# Patient Record
Sex: Female | Born: 1945 | Race: Black or African American | Hispanic: No | Marital: Single | Smoking: Current every day smoker
Health system: Southern US, Community
[De-identification: ages and names within clinical notes are randomized; demographics above are authoritative.]

## PROBLEM LIST (undated history)

## (undated) DIAGNOSIS — K903 Pancreatic steatorrhea: Secondary | ICD-10-CM

## (undated) DIAGNOSIS — D126 Benign neoplasm of colon, unspecified: Secondary | ICD-10-CM

## (undated) DIAGNOSIS — J441 Chronic obstructive pulmonary disease with (acute) exacerbation: Secondary | ICD-10-CM

## (undated) DIAGNOSIS — K648 Other hemorrhoids: Secondary | ICD-10-CM

## (undated) DIAGNOSIS — B3781 Candidal esophagitis: Secondary | ICD-10-CM

## (undated) DIAGNOSIS — I509 Heart failure, unspecified: Secondary | ICD-10-CM

## (undated) DIAGNOSIS — K922 Gastrointestinal hemorrhage, unspecified: Secondary | ICD-10-CM

## (undated) DIAGNOSIS — I480 Paroxysmal atrial fibrillation: Secondary | ICD-10-CM

## (undated) DIAGNOSIS — E119 Type 2 diabetes mellitus without complications: Secondary | ICD-10-CM

## (undated) DIAGNOSIS — I998 Other disorder of circulatory system: Secondary | ICD-10-CM

## (undated) DIAGNOSIS — D509 Iron deficiency anemia, unspecified: Secondary | ICD-10-CM

## (undated) DIAGNOSIS — M1990 Unspecified osteoarthritis, unspecified site: Secondary | ICD-10-CM

## (undated) DIAGNOSIS — I219 Acute myocardial infarction, unspecified: Secondary | ICD-10-CM

## (undated) DIAGNOSIS — Z5189 Encounter for other specified aftercare: Secondary | ICD-10-CM

## (undated) DIAGNOSIS — I2510 Atherosclerotic heart disease of native coronary artery without angina pectoris: Secondary | ICD-10-CM

## (undated) DIAGNOSIS — I10 Essential (primary) hypertension: Secondary | ICD-10-CM

## (undated) DIAGNOSIS — K5790 Diverticulosis of intestine, part unspecified, without perforation or abscess without bleeding: Secondary | ICD-10-CM

## (undated) HISTORY — DX: Type 2 diabetes mellitus without complications: E11.9

## (undated) HISTORY — DX: Other disorder of circulatory system: I99.8

## (undated) HISTORY — PX: KNEE ARTHROPLASTY: SHX992

## (undated) HISTORY — DX: Acute myocardial infarction, unspecified: I21.9

## (undated) HISTORY — DX: Diverticulosis of intestine, part unspecified, without perforation or abscess without bleeding: K57.90

## (undated) HISTORY — DX: Paroxysmal atrial fibrillation: I48.0

## (undated) HISTORY — DX: Pancreatic steatorrhea: K90.3

## (undated) HISTORY — DX: Essential (primary) hypertension: I10

## (undated) HISTORY — DX: Candidal esophagitis: B37.81

## (undated) HISTORY — PX: TUBAL LIGATION: SHX77

## (undated) HISTORY — DX: Benign neoplasm of colon, unspecified: D12.6

## (undated) HISTORY — PX: CATARACT EXTRACTION: SUR2

## (undated) HISTORY — DX: Chronic obstructive pulmonary disease with (acute) exacerbation: J44.1

## (undated) HISTORY — DX: Encounter for other specified aftercare: Z51.89

## (undated) HISTORY — DX: Unspecified osteoarthritis, unspecified site: M19.90

## (undated) HISTORY — PX: CARPAL TUNNEL RELEASE: SHX101

## (undated) HISTORY — DX: Atherosclerotic heart disease of native coronary artery without angina pectoris: I25.10

## (undated) HISTORY — DX: Other hemorrhoids: K64.8

## (undated) HISTORY — DX: Gastrointestinal hemorrhage, unspecified: K92.2

## (undated) HISTORY — DX: Heart failure, unspecified: I50.9

## (undated) HISTORY — DX: Iron deficiency anemia, unspecified: D50.9

---

## 1998-12-13 ENCOUNTER — Emergency Department (HOSPITAL_COMMUNITY): Admission: EM | Admit: 1998-12-13 | Discharge: 1998-12-13 | Payer: Self-pay | Admitting: Emergency Medicine

## 1999-01-01 ENCOUNTER — Emergency Department (HOSPITAL_COMMUNITY): Admission: EM | Admit: 1999-01-01 | Discharge: 1999-01-01 | Payer: Self-pay | Admitting: Emergency Medicine

## 1999-02-05 ENCOUNTER — Ambulatory Visit (HOSPITAL_COMMUNITY): Admission: RE | Admit: 1999-02-05 | Discharge: 1999-02-05 | Payer: Self-pay | Admitting: Internal Medicine

## 1999-02-05 ENCOUNTER — Encounter: Payer: Self-pay | Admitting: Internal Medicine

## 1999-03-21 ENCOUNTER — Encounter: Payer: Self-pay | Admitting: Internal Medicine

## 1999-03-21 ENCOUNTER — Inpatient Hospital Stay (HOSPITAL_COMMUNITY): Admission: AD | Admit: 1999-03-21 | Discharge: 1999-03-28 | Payer: Self-pay | Admitting: Cardiology

## 1999-08-02 ENCOUNTER — Inpatient Hospital Stay (HOSPITAL_COMMUNITY): Admission: AD | Admit: 1999-08-02 | Discharge: 1999-08-08 | Payer: Self-pay | Admitting: Cardiology

## 1999-09-17 ENCOUNTER — Encounter: Payer: Self-pay | Admitting: Cardiology

## 1999-09-17 ENCOUNTER — Inpatient Hospital Stay (HOSPITAL_COMMUNITY): Admission: EM | Admit: 1999-09-17 | Discharge: 1999-09-20 | Payer: Self-pay | Admitting: Emergency Medicine

## 1999-09-20 ENCOUNTER — Encounter: Payer: Self-pay | Admitting: Emergency Medicine

## 2000-02-25 ENCOUNTER — Emergency Department (HOSPITAL_COMMUNITY): Admission: EM | Admit: 2000-02-25 | Discharge: 2000-02-25 | Payer: Self-pay | Admitting: Emergency Medicine

## 2000-03-02 ENCOUNTER — Emergency Department (HOSPITAL_COMMUNITY): Admission: EM | Admit: 2000-03-02 | Discharge: 2000-03-02 | Payer: Self-pay | Admitting: Emergency Medicine

## 2000-03-06 ENCOUNTER — Emergency Department (HOSPITAL_COMMUNITY): Admission: EM | Admit: 2000-03-06 | Discharge: 2000-03-06 | Payer: Self-pay | Admitting: Emergency Medicine

## 2000-04-06 ENCOUNTER — Ambulatory Visit (HOSPITAL_COMMUNITY): Admission: RE | Admit: 2000-04-06 | Discharge: 2000-04-06 | Payer: Self-pay | Admitting: Internal Medicine

## 2000-04-06 ENCOUNTER — Encounter: Payer: Self-pay | Admitting: Internal Medicine

## 2000-08-21 ENCOUNTER — Encounter: Payer: Self-pay | Admitting: Family Medicine

## 2000-08-21 ENCOUNTER — Ambulatory Visit (HOSPITAL_COMMUNITY): Admission: RE | Admit: 2000-08-21 | Discharge: 2000-08-21 | Payer: Self-pay | Admitting: Family Medicine

## 2000-09-23 ENCOUNTER — Ambulatory Visit (HOSPITAL_COMMUNITY): Admission: RE | Admit: 2000-09-23 | Discharge: 2000-09-23 | Payer: Self-pay | Admitting: Family Medicine

## 2000-10-22 ENCOUNTER — Encounter: Admission: RE | Admit: 2000-10-22 | Discharge: 2000-11-30 | Payer: Self-pay | Admitting: Family Medicine

## 2000-11-05 ENCOUNTER — Ambulatory Visit (HOSPITAL_COMMUNITY): Admission: RE | Admit: 2000-11-05 | Discharge: 2000-11-06 | Payer: Self-pay | Admitting: Cardiology

## 2000-12-17 ENCOUNTER — Encounter: Admission: RE | Admit: 2000-12-17 | Discharge: 2001-01-04 | Payer: Self-pay | Admitting: Family Medicine

## 2003-12-30 HISTORY — PX: CORONARY ARTERY BYPASS GRAFT: SHX141

## 2009-12-29 HISTORY — PX: TOTAL SHOULDER ARTHROPLASTY: SHX126

## 2011-06-11 DIAGNOSIS — G4733 Obstructive sleep apnea (adult) (pediatric): Secondary | ICD-10-CM | POA: Insufficient documentation

## 2011-06-11 DIAGNOSIS — M159 Polyosteoarthritis, unspecified: Secondary | ICD-10-CM | POA: Insufficient documentation

## 2011-07-30 DIAGNOSIS — K5730 Diverticulosis of large intestine without perforation or abscess without bleeding: Secondary | ICD-10-CM | POA: Insufficient documentation

## 2011-07-30 DIAGNOSIS — K648 Other hemorrhoids: Secondary | ICD-10-CM | POA: Insufficient documentation

## 2014-08-22 DIAGNOSIS — Z96659 Presence of unspecified artificial knee joint: Secondary | ICD-10-CM | POA: Insufficient documentation

## 2014-12-27 DIAGNOSIS — M76899 Other specified enthesopathies of unspecified lower limb, excluding foot: Secondary | ICD-10-CM | POA: Insufficient documentation

## 2015-06-15 DIAGNOSIS — M4316 Spondylolisthesis, lumbar region: Secondary | ICD-10-CM | POA: Insufficient documentation

## 2017-05-19 DIAGNOSIS — N3941 Urge incontinence: Secondary | ICD-10-CM | POA: Insufficient documentation

## 2017-05-19 DIAGNOSIS — N3281 Overactive bladder: Secondary | ICD-10-CM | POA: Insufficient documentation

## 2017-08-18 DIAGNOSIS — I4892 Unspecified atrial flutter: Secondary | ICD-10-CM | POA: Insufficient documentation

## 2017-10-29 DIAGNOSIS — A048 Other specified bacterial intestinal infections: Secondary | ICD-10-CM

## 2017-10-29 HISTORY — DX: Other specified bacterial intestinal infections: A04.8

## 2017-12-17 DIAGNOSIS — K903 Pancreatic steatorrhea: Secondary | ICD-10-CM | POA: Insufficient documentation

## 2018-12-29 DIAGNOSIS — G459 Transient cerebral ischemic attack, unspecified: Secondary | ICD-10-CM

## 2018-12-29 HISTORY — DX: Transient cerebral ischemic attack, unspecified: G45.9

## 2018-12-31 DIAGNOSIS — R9439 Abnormal result of other cardiovascular function study: Secondary | ICD-10-CM | POA: Insufficient documentation

## 2019-11-13 DIAGNOSIS — H8111 Benign paroxysmal vertigo, right ear: Secondary | ICD-10-CM | POA: Insufficient documentation

## 2019-11-13 DIAGNOSIS — G459 Transient cerebral ischemic attack, unspecified: Secondary | ICD-10-CM | POA: Insufficient documentation

## 2020-09-06 DIAGNOSIS — R55 Syncope and collapse: Secondary | ICD-10-CM | POA: Insufficient documentation

## 2020-12-24 DIAGNOSIS — M25572 Pain in left ankle and joints of left foot: Secondary | ICD-10-CM | POA: Insufficient documentation

## 2021-04-08 ENCOUNTER — Encounter (HOSPITAL_COMMUNITY): Payer: Self-pay | Admitting: Internal Medicine

## 2021-04-08 ENCOUNTER — Inpatient Hospital Stay (HOSPITAL_COMMUNITY)
Admission: EM | Admit: 2021-04-08 | Discharge: 2021-04-11 | DRG: 190 | Disposition: A | Discharge status: Home / Self Care | Payer: 59 | Attending: Internal Medicine | Admitting: Internal Medicine

## 2021-04-08 ENCOUNTER — Other Ambulatory Visit: Payer: Self-pay

## 2021-04-08 ENCOUNTER — Emergency Department (HOSPITAL_COMMUNITY): Payer: 59

## 2021-04-08 DIAGNOSIS — E119 Type 2 diabetes mellitus without complications: Secondary | ICD-10-CM

## 2021-04-08 DIAGNOSIS — R195 Other fecal abnormalities: Secondary | ICD-10-CM | POA: Diagnosis not present

## 2021-04-08 DIAGNOSIS — K5520 Angiodysplasia of colon without hemorrhage: Secondary | ICD-10-CM

## 2021-04-08 DIAGNOSIS — D122 Benign neoplasm of ascending colon: Secondary | ICD-10-CM

## 2021-04-08 DIAGNOSIS — Z951 Presence of aortocoronary bypass graft: Secondary | ICD-10-CM | POA: Diagnosis not present

## 2021-04-08 DIAGNOSIS — I4510 Unspecified right bundle-branch block: Secondary | ICD-10-CM | POA: Diagnosis present

## 2021-04-08 DIAGNOSIS — I509 Heart failure, unspecified: Secondary | ICD-10-CM

## 2021-04-08 DIAGNOSIS — E785 Hyperlipidemia, unspecified: Secondary | ICD-10-CM | POA: Diagnosis present

## 2021-04-08 DIAGNOSIS — D62 Acute posthemorrhagic anemia: Secondary | ICD-10-CM | POA: Diagnosis present

## 2021-04-08 DIAGNOSIS — I10 Essential (primary) hypertension: Secondary | ICD-10-CM

## 2021-04-08 DIAGNOSIS — I482 Chronic atrial fibrillation, unspecified: Secondary | ICD-10-CM | POA: Diagnosis present

## 2021-04-08 DIAGNOSIS — B3781 Candidal esophagitis: Secondary | ICD-10-CM | POA: Diagnosis present

## 2021-04-08 DIAGNOSIS — J441 Chronic obstructive pulmonary disease with (acute) exacerbation: Secondary | ICD-10-CM | POA: Diagnosis present

## 2021-04-08 DIAGNOSIS — R600 Localized edema: Secondary | ICD-10-CM | POA: Diagnosis not present

## 2021-04-08 DIAGNOSIS — G8929 Other chronic pain: Secondary | ICD-10-CM | POA: Diagnosis present

## 2021-04-08 DIAGNOSIS — E1165 Type 2 diabetes mellitus with hyperglycemia: Secondary | ICD-10-CM | POA: Diagnosis present

## 2021-04-08 DIAGNOSIS — Z79899 Other long term (current) drug therapy: Secondary | ICD-10-CM

## 2021-04-08 DIAGNOSIS — Z794 Long term (current) use of insulin: Secondary | ICD-10-CM | POA: Diagnosis not present

## 2021-04-08 DIAGNOSIS — I110 Hypertensive heart disease with heart failure: Secondary | ICD-10-CM | POA: Diagnosis present

## 2021-04-08 DIAGNOSIS — Z7901 Long term (current) use of anticoagulants: Secondary | ICD-10-CM

## 2021-04-08 DIAGNOSIS — I2510 Atherosclerotic heart disease of native coronary artery without angina pectoris: Secondary | ICD-10-CM | POA: Diagnosis present

## 2021-04-08 DIAGNOSIS — K5521 Angiodysplasia of colon with hemorrhage: Secondary | ICD-10-CM | POA: Diagnosis present

## 2021-04-08 DIAGNOSIS — Z8249 Family history of ischemic heart disease and other diseases of the circulatory system: Secondary | ICD-10-CM

## 2021-04-08 DIAGNOSIS — D125 Benign neoplasm of sigmoid colon: Secondary | ICD-10-CM

## 2021-04-08 DIAGNOSIS — K5730 Diverticulosis of large intestine without perforation or abscess without bleeding: Secondary | ICD-10-CM | POA: Diagnosis present

## 2021-04-08 DIAGNOSIS — K921 Melena: Secondary | ICD-10-CM | POA: Diagnosis not present

## 2021-04-08 DIAGNOSIS — I480 Paroxysmal atrial fibrillation: Secondary | ICD-10-CM | POA: Diagnosis present

## 2021-04-08 DIAGNOSIS — F17210 Nicotine dependence, cigarettes, uncomplicated: Secondary | ICD-10-CM | POA: Diagnosis present

## 2021-04-08 DIAGNOSIS — K31811 Angiodysplasia of stomach and duodenum with bleeding: Secondary | ICD-10-CM | POA: Diagnosis present

## 2021-04-08 DIAGNOSIS — Z7951 Long term (current) use of inhaled steroids: Secondary | ICD-10-CM

## 2021-04-08 DIAGNOSIS — E669 Obesity, unspecified: Secondary | ICD-10-CM | POA: Diagnosis present

## 2021-04-08 DIAGNOSIS — D500 Iron deficiency anemia secondary to blood loss (chronic): Secondary | ICD-10-CM | POA: Diagnosis not present

## 2021-04-08 DIAGNOSIS — Z20822 Contact with and (suspected) exposure to covid-19: Secondary | ICD-10-CM | POA: Diagnosis present

## 2021-04-08 DIAGNOSIS — Z66 Do not resuscitate: Secondary | ICD-10-CM | POA: Diagnosis present

## 2021-04-08 DIAGNOSIS — I5089 Other heart failure: Secondary | ICD-10-CM | POA: Diagnosis not present

## 2021-04-08 DIAGNOSIS — Z880 Allergy status to penicillin: Secondary | ICD-10-CM

## 2021-04-08 DIAGNOSIS — M549 Dorsalgia, unspecified: Secondary | ICD-10-CM | POA: Diagnosis present

## 2021-04-08 DIAGNOSIS — D509 Iron deficiency anemia, unspecified: Secondary | ICD-10-CM

## 2021-04-08 DIAGNOSIS — Z6841 Body Mass Index (BMI) 40.0 and over, adult: Secondary | ICD-10-CM

## 2021-04-08 DIAGNOSIS — D649 Anemia, unspecified: Secondary | ICD-10-CM

## 2021-04-08 DIAGNOSIS — Z882 Allergy status to sulfonamides status: Secondary | ICD-10-CM

## 2021-04-08 DIAGNOSIS — Z8719 Personal history of other diseases of the digestive system: Secondary | ICD-10-CM

## 2021-04-08 DIAGNOSIS — D123 Benign neoplasm of transverse colon: Secondary | ICD-10-CM

## 2021-04-08 LAB — RESP PANEL BY RT-PCR (FLU A&B, COVID) ARPGX2
Influenza A by PCR: NEGATIVE
Influenza B by PCR: NEGATIVE
SARS Coronavirus 2 by RT PCR: NEGATIVE

## 2021-04-08 LAB — COMPREHENSIVE METABOLIC PANEL
ALT: 18 U/L (ref 0–44)
AST: 16 U/L (ref 15–41)
Albumin: 3.5 g/dL (ref 3.5–5.0)
Alkaline Phosphatase: 69 U/L (ref 38–126)
Anion gap: 8 (ref 5–15)
BUN: <5 mg/dL — ABNORMAL LOW (ref 8–23)
CO2: 28 mmol/L (ref 22–32)
Calcium: 8.7 mg/dL — ABNORMAL LOW (ref 8.9–10.3)
Chloride: 99 mmol/L (ref 98–111)
Creatinine, Ser: 0.57 mg/dL (ref 0.44–1.00)
GFR, Estimated: >60 mL/min (ref >60)
Glucose, Bld: 263 mg/dL — ABNORMAL HIGH (ref 70–99)
Potassium: 3.9 mmol/L (ref 3.5–5.1)
Sodium: 135 mmol/L (ref 135–145)
Total Bilirubin: 0.5 mg/dL (ref 0.3–1.2)
Total Protein: 6.7 g/dL (ref 6.5–8.1)

## 2021-04-08 LAB — CBC WITH DIFFERENTIAL/PLATELET
Abs Immature Granulocytes: 0.04 10*3/uL (ref 0.00–0.07)
Basophils Absolute: 0.1 10*3/uL (ref 0.0–0.1)
Basophils Relative: 1 %
Eosinophils Absolute: 0.2 10*3/uL (ref 0.0–0.5)
Eosinophils Relative: 3 %
HCT: 25.4 % — ABNORMAL LOW (ref 36.0–46.0)
Hemoglobin: 7.8 g/dL — ABNORMAL LOW (ref 12.0–15.0)
Immature Granulocytes: 1 %
Lymphocytes Relative: 11 %
Lymphs Abs: 0.7 10*3/uL (ref 0.7–4.0)
MCH: 25.7 pg — ABNORMAL LOW (ref 26.0–34.0)
MCHC: 30.7 g/dL (ref 30.0–36.0)
MCV: 83.6 fL (ref 80.0–100.0)
Monocytes Absolute: 0.6 10*3/uL (ref 0.1–1.0)
Monocytes Relative: 9 %
Neutro Abs: 5.3 10*3/uL (ref 1.7–7.7)
Neutrophils Relative %: 75 %
Platelets: 346 10*3/uL (ref 150–400)
RBC: 3.04 MIL/uL — ABNORMAL LOW (ref 3.87–5.11)
RDW: 15.4 % (ref 11.5–15.5)
WBC: 7 10*3/uL (ref 4.0–10.5)
nRBC: 0.4 % — ABNORMAL HIGH (ref 0.0–0.2)

## 2021-04-08 LAB — POC OCCULT BLOOD, ED: Fecal Occult Bld: POSITIVE — AB

## 2021-04-08 LAB — IRON AND TIBC
Iron: 15 ug/dL — ABNORMAL LOW (ref 28–170)
Saturation Ratios: 3 % — ABNORMAL LOW (ref 10.4–31.8)
TIBC: 528 ug/dL — ABNORMAL HIGH (ref 250–450)
UIBC: 513 ug/dL

## 2021-04-08 LAB — URINALYSIS, ROUTINE W REFLEX MICROSCOPIC
Bacteria, UA: NONE SEEN
Bilirubin Urine: NEGATIVE
Glucose, UA: NEGATIVE mg/dL
Ketones, ur: NEGATIVE mg/dL
Leukocytes,Ua: NEGATIVE
Nitrite: NEGATIVE
Protein, ur: NEGATIVE mg/dL
Specific Gravity, Urine: 1.006 (ref 1.005–1.030)
pH: 7 (ref 5.0–8.0)

## 2021-04-08 LAB — HEMOGLOBIN AND HEMATOCRIT, BLOOD
HCT: 24.5 % — ABNORMAL LOW (ref 36.0–46.0)
HCT: 26.2 % — ABNORMAL LOW (ref 36.0–46.0)
Hemoglobin: 7.6 g/dL — ABNORMAL LOW (ref 12.0–15.0)
Hemoglobin: 8.3 g/dL — ABNORMAL LOW (ref 12.0–15.0)

## 2021-04-08 LAB — ABO/RH: ABO/RH(D): AB POS

## 2021-04-08 LAB — PROTIME-INR
INR: 2.6 — ABNORMAL HIGH (ref 0.8–1.2)
Prothrombin Time: 26.8 seconds — ABNORMAL HIGH (ref 11.4–15.2)

## 2021-04-08 LAB — HEMOGLOBIN A1C
Hgb A1c MFr Bld: 6.3 % — ABNORMAL HIGH (ref 4.8–5.6)
Mean Plasma Glucose: 134.11 mg/dL

## 2021-04-08 LAB — CBG MONITORING, ED: Glucose-Capillary: 273 mg/dL — ABNORMAL HIGH (ref 70–99)

## 2021-04-08 LAB — TROPONIN I (HIGH SENSITIVITY)
Troponin I (High Sensitivity): 10 ng/L (ref <18)
Troponin I (High Sensitivity): 9 ng/L (ref <18)

## 2021-04-08 LAB — BRAIN NATRIURETIC PEPTIDE: B Natriuretic Peptide: 108 pg/mL — ABNORMAL HIGH (ref 0.0–100.0)

## 2021-04-08 LAB — PREPARE RBC (CROSSMATCH)

## 2021-04-08 LAB — GLUCOSE, CAPILLARY: Glucose-Capillary: 460 mg/dL — ABNORMAL HIGH (ref 70–99)

## 2021-04-08 LAB — HIV ANTIBODY (ROUTINE TESTING W REFLEX): HIV Screen 4th Generation wRfx: NONREACTIVE

## 2021-04-08 LAB — FERRITIN: Ferritin: 6 ng/mL — ABNORMAL LOW (ref 11–307)

## 2021-04-08 MED ORDER — METOPROLOL SUCCINATE ER 100 MG PO TB24
100.0000 mg | ORAL_TABLET | Freq: Every day | ORAL | Status: DC
  Administered 2021-04-08 – 2021-04-11 (×4): 100 mg via ORAL
  Filled 2021-04-08 (×4): qty 1

## 2021-04-08 MED ORDER — ALBUTEROL SULFATE (2.5 MG/3ML) 0.083% IN NEBU: 2.5000 mg | INHALATION_SOLUTION | RESPIRATORY_TRACT | Status: DC | PRN

## 2021-04-08 MED ORDER — INSULIN GLARGINE 100 UNIT/ML ~~LOC~~ SOLN
42.0000 [IU] | Freq: Every day | SUBCUTANEOUS | Status: DC
  Administered 2021-04-08: 42 [IU] via SUBCUTANEOUS
  Filled 2021-04-08 (×3): qty 0.42

## 2021-04-08 MED ORDER — INSULIN ASPART 100 UNIT/ML ~~LOC~~ SOLN
0.0000 [IU] | Freq: Every day | SUBCUTANEOUS | Status: DC
  Administered 2021-04-08: 5 [IU] via SUBCUTANEOUS

## 2021-04-08 MED ORDER — INSULIN DEGLUDEC 100 UNIT/ML ~~LOC~~ SOPN: 42.0000 [IU] | PEN_INJECTOR | Freq: Every day | SUBCUTANEOUS | Status: DC

## 2021-04-08 MED ORDER — AZITHROMYCIN 500 MG PO TABS
500.0000 mg | ORAL_TABLET | Freq: Every day | ORAL | Status: DC
  Administered 2021-04-09 – 2021-04-11 (×3): 500 mg via ORAL
  Filled 2021-04-08 (×4): qty 1

## 2021-04-08 MED ORDER — METHYLPREDNISOLONE SODIUM SUCC 125 MG IJ SOLR
125.0000 mg | Freq: Once | INTRAMUSCULAR | Status: AC
  Administered 2021-04-08: 125 mg via INTRAVENOUS
  Filled 2021-04-08: qty 2

## 2021-04-08 MED ORDER — PREDNISONE 20 MG PO TABS
40.0000 mg | ORAL_TABLET | Freq: Every day | ORAL | Status: DC
  Administered 2021-04-09: 40 mg via ORAL
  Filled 2021-04-08: qty 2

## 2021-04-08 MED ORDER — AEROCHAMBER PLUS FLO-VU MEDIUM MISC
1.0000 | Freq: Once | Status: DC
  Filled 2021-04-08: qty 1

## 2021-04-08 MED ORDER — GUAIFENESIN-DM 100-10 MG/5ML PO SYRP
10.0000 mL | ORAL_SOLUTION | ORAL | Status: DC | PRN
  Administered 2021-04-08 – 2021-04-09 (×4): 10 mL via ORAL
  Filled 2021-04-08 (×4): qty 10

## 2021-04-08 MED ORDER — INSULIN ASPART 100 UNIT/ML ~~LOC~~ SOLN
0.0000 [IU] | Freq: Three times a day (TID) | SUBCUTANEOUS | Status: DC
  Administered 2021-04-08: 20 [IU] via SUBCUTANEOUS
  Administered 2021-04-09: 15 [IU] via SUBCUTANEOUS
  Administered 2021-04-09: 7 [IU] via SUBCUTANEOUS
  Administered 2021-04-09 – 2021-04-11 (×2): 4 [IU] via SUBCUTANEOUS

## 2021-04-08 MED ORDER — FLUTICASONE FUROATE-VILANTEROL 100-25 MCG/INH IN AEPB
1.0000 | INHALATION_SPRAY | Freq: Every day | RESPIRATORY_TRACT | Status: DC
  Administered 2021-04-09 – 2021-04-11 (×3): 1 via RESPIRATORY_TRACT
  Filled 2021-04-08: qty 28

## 2021-04-08 MED ORDER — GABAPENTIN 100 MG PO CAPS
200.0000 mg | ORAL_CAPSULE | Freq: Three times a day (TID) | ORAL | Status: DC
  Administered 2021-04-08 – 2021-04-11 (×8): 200 mg via ORAL
  Filled 2021-04-08 (×8): qty 2

## 2021-04-08 MED ORDER — PANTOPRAZOLE SODIUM 40 MG IV SOLR
40.0000 mg | Freq: Two times a day (BID) | INTRAVENOUS | Status: DC
  Administered 2021-04-08 – 2021-04-09 (×4): 40 mg via INTRAVENOUS
  Filled 2021-04-08 (×4): qty 40

## 2021-04-08 MED ORDER — IPRATROPIUM-ALBUTEROL 0.5-2.5 (3) MG/3ML IN SOLN
3.0000 mL | Freq: Four times a day (QID) | RESPIRATORY_TRACT | Status: DC
  Administered 2021-04-08 (×2): 3 mL via RESPIRATORY_TRACT
  Filled 2021-04-08 (×2): qty 3

## 2021-04-08 MED ORDER — PRAVASTATIN SODIUM 10 MG PO TABS
20.0000 mg | ORAL_TABLET | Freq: Every day | ORAL | Status: DC
  Administered 2021-04-08 – 2021-04-10 (×3): 20 mg via ORAL
  Filled 2021-04-08 (×3): qty 2

## 2021-04-08 MED ORDER — SODIUM CHLORIDE 0.9% IV SOLUTION: Freq: Once | INTRAVENOUS | Status: AC

## 2021-04-08 MED ORDER — HYDROCODONE-ACETAMINOPHEN 10-325 MG PO TABS
1.0000 | ORAL_TABLET | Freq: Two times a day (BID) | ORAL | Status: DC | PRN
  Administered 2021-04-08 – 2021-04-10 (×3): 1 via ORAL
  Filled 2021-04-08 (×4): qty 1

## 2021-04-08 MED ORDER — ALBUTEROL SULFATE HFA 108 (90 BASE) MCG/ACT IN AERS
4.0000 | INHALATION_SPRAY | Freq: Once | RESPIRATORY_TRACT | Status: AC
  Administered 2021-04-08: 4 via RESPIRATORY_TRACT
  Filled 2021-04-08: qty 6.7

## 2021-04-08 MED ORDER — AZITHROMYCIN 500 MG IV SOLR
500.0000 mg | INTRAVENOUS | Status: AC
  Administered 2021-04-08: 500 mg via INTRAVENOUS
  Filled 2021-04-08: qty 500

## 2021-04-08 MED ORDER — IPRATROPIUM-ALBUTEROL 0.5-2.5 (3) MG/3ML IN SOLN
3.0000 mL | Freq: Three times a day (TID) | RESPIRATORY_TRACT | Status: DC
  Administered 2021-04-09 – 2021-04-10 (×4): 3 mL via RESPIRATORY_TRACT
  Filled 2021-04-08 (×4): qty 3

## 2021-04-08 MED ORDER — ACETAMINOPHEN 325 MG PO TABS
650.0000 mg | ORAL_TABLET | Freq: Four times a day (QID) | ORAL | Status: DC | PRN
  Administered 2021-04-08 – 2021-04-11 (×4): 650 mg via ORAL
  Filled 2021-04-08 (×4): qty 2

## 2021-04-08 NOTE — H&P (View-Only) (Signed)
Consultation  Referring Provider:  Fam practice service Primary Care Physician:  Pcp, No Primary Gastroenterologist:  none  Reason for Consultation:  Anemia, hx of melena  HPI: Caitlyn Lawrence is a 75 y.o. female, admitted through the emergency room this morning after presenting with complaints of shortness of breath over the past couple of weeks.  She says the shortness of breath has gotten to the point that she could not walk to the bathroom.  Denies any chest pain.  On further questioning she also related that she had been having tarry black appearing stools.  She apparently is on iron chronically. She has history of atrial fibrillation for which she is on Eliquis, insulin-dependent diabetes mellitus, COPD, chronic back pain, history of coronary artery disease status post CABG and history of hypertension.  Was documented heme positive in the ER. Admitting labs hemoglobin 7.8/hematocrit 25.4, MCV of 83, platelets 316 BUN less than 5/creatinine 0.57 LFTs within normal limits Pro time 26.8/INR 2.6, troponins negative COVID-19 negative Chest x-ray today no acute abnormality  Patient says that she stays on iron supplement chronically so her stools are always dark but over the past several days had noticed more "tarry black" stools.  She denies any abdominal pain or discomfort, no diarrhea, no nausea or vomiting no heartburn or indigestion. Patient relates that she is just in the process of moving to [place] from [facility] and has had GI evaluation there for recurrent GI bleeding.  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  She says the last procedures were about 4 years ago and these were all done by Dr. Chauncey Reading gastroenterology. She has been on Xarelto for several years, she has not been on any aspirin over the past couple of years due to the  recurrent bleeding, uses Tylenol as needed.     Prior to Admission medications   Medication Sig Start Date End Date Taking? Authorizing Provider  albuterol (ACCUNEB) 0.63 MG/3ML nebulizer solution Take 1 ampule by nebulization every 6 (six) hours as needed for wheezing or shortness of breath.   Yes [provider]  amLODipine (NORVASC) 2.5 MG tablet Take 2.5 mg by mouth daily.   Yes [provider]  ferrous sulfate 325 (65 FE) MG tablet Take 325 mg by mouth daily with breakfast.   Yes [provider]  fluticasone furoate-vilanterol (BREO ELLIPTA) 100-25 MCG/INH AEPB Inhale 1 puff into the lungs daily.   Yes [provider]  gabapentin (NEURONTIN) 100 MG capsule Take 200 mg by mouth 3 (three) times daily.   Yes [provider]  HYDROcodone-acetaminophen (NORCO) 10-325 MG tablet Take 1 tablet by mouth 2 (two) times daily as needed (pain).   Yes [provider]  hydrOXYzine (VISTARIL) 25 MG capsule Take 25 mg by mouth as needed for itching.   Yes [provider]  insulin degludec (TRESIBA FLEXTOUCH) 100 UNIT/ML FlexTouch Pen Inject 54 Units into the skin at bedtime.   Yes [provider]  insulin lispro (HUMALOG) 100 UNIT/ML injection Inject 15 Units into the skin 3 (three) times daily with meals.   Yes [provider]  Ipratropium-Albuterol (COMBIVENT RESPIMAT) 20-100 MCG/ACT AERS respimat Inhale 1 puff into the lungs 4 (four) times daily.   Yes [provider]  ipratropium-albuterol (DUONEB) 0.5-2.5 (3) MG/3ML SOLN Take 3 mLs by nebulization 4 (four) times daily.   Yes [provider]  isosorbide mononitrate (IMDUR) 60 MG 24 hr tablet Take 60 mg by mouth every morning.   Yes [provider]  losartan (COZAAR) 100 MG tablet Take 100 mg by mouth daily.   Yes [provider]  lovastatin (MEVACOR) 20 MG tablet Take 20 mg by mouth every evening.   Yes [provider]  meclizine  (ANTIVERT) 12.5 MG tablet Take 12.5 mg by mouth as needed for dizziness.   Yes [provider]  metoprolol succinate (TOPROL-XL) 100 MG 24 hr tablet Take 100 mg by mouth daily. Take with or immediately following a meal.   Yes [provider]  nitroGLYCERIN (NITROSTAT) 0.4 MG SL tablet Place 0.4 mg under the tongue every 5 (five) minutes as needed for chest pain.   Yes [provider]  ondansetron (ZOFRAN) 4 MG tablet Take 4 mg by mouth as needed for nausea or vomiting.   Yes [provider]  polyethylene glycol (MIRALAX / GLYCOLAX) 17 g packet Take 17 g by mouth as needed for mild constipation.   Yes [provider]  rivaroxaban (XARELTO) 20 MG TABS tablet Take 20 mg by mouth every morning.   Yes [provider]  Vitamin D, Ergocalciferol, (DRISDOL) 1.25 MG (50000 UNIT) CAPS capsule Take 50,000 Units by mouth every 7 (seven) days. Mondays   Yes [provider]    Current Facility-Administered Medications  Medication Dose Route Frequency Provider Last Rate Last Admin  . 0.9 %  sodium chloride infusion (Manually program via Guardrails IV Fluids)   Intravenous Once [place], Carley D, DO      . AeroChamber Plus Flo-Vu Medium MISC 1 each  1 each Other Once Aberman, Caroline C, PA-C      . albuterol (PROVENTIL) (2.5 MG/3ML) 0.083% nebulizer solution 2.5 mg  2.5 mg Nebulization Q2H PRN Bloomfield, Carley D, DO      . azithromycin (ZITHROMAX) 500 mg in sodium chloride 0.9 % 250 mL IVPB  500 mg Intravenous Q24H Bloomfield, Carley D, DO       Followed by  . [START ON 04/09/2021] azithromycin (ZITHROMAX) tablet 500 mg  500 mg Oral Daily Bloomfield, Carley D, DO      . fluticasone furoate-vilanterol (BREO ELLIPTA) 100-25 MCG/INH 1 puff  1 puff Inhalation Daily Bloomfield, Carley D, DO      . gabapentin (NEURONTIN) capsule 200 mg  200 mg Oral TID Bloomfield, Carley D, DO      . HYDROcodone-acetaminophen (NORCO) 10-325 MG per tablet 1 tablet  1  tablet Oral BID PRN [place]field, Carley D, DO      . insulin aspart (novoLOG) injection 0-20 Units  0-20 Units Subcutaneous TID WC Bloomfield, Carley D, DO      . insulin aspart (novoLOG) injection 0-5 Units  0-5 Units Subcutaneous QHS Bloomfield, Carley D, DO      . insulin glargine (LANTUS) injection 42 Units  42 Units Subcutaneous QHS Pierce, Dwayne A, RPH      . ipratropium-albuterol (DUONEB) 0.5-2.5 (3) MG/3ML nebulizer solution 3 mL  3 mL Nebulization Q6H Bloomfield, Carley D, DO      . metoprolol succinate (TOPROL-XL) 24 hr tablet 100 mg  100 mg Oral Daily Bloomfield, Carley D, DO      . pantoprazole (PROTONIX) injection 40 mg  40 mg Intravenous Q12H Bloomfield, Carley D, DO      . pravastatin (PRAVACHOL) tablet 20 mg  20 mg Oral q1800 Bloomfield, Carley D, DO      . [START ON  04/09/2021] predniSONE (DELTASONE) tablet 40 mg  40 mg Oral Q breakfast Bloomfield, Carley D, DO        Allergies as of 04/08/2021 - Review Complete 04/08/2021  Allergen Reaction Noted  . Penicillins Hives and Itching 04/08/2021  . Sulfa antibiotics Other (See Comments) 04/08/2021    No family history on file.  Social History   Socioeconomic History  . Marital status: Single    Spouse name: Not on file  . Number of children: Not on file  . Years of education: Not on file  . Highest education level: Not on file  Occupational History  . Not on file  Tobacco Use  . Smoking status: Not on file  . Smokeless tobacco: Not on file  Substance and Sexual Activity  . Alcohol use: Not on file  . Drug use: Not on file  . Sexual activity: Not on file  Other Topics Concern  . Not on file  Social History Narrative  . Not on file   Social Determinants of Health   Financial Resource Strain: Not on file  Food Insecurity: Not on file  Transportation Needs: Not on file  Physical Activity: Not on file  Stress: Not on file  Social Connections: Not on file  Intimate Partner Violence: Not on file    Review of  Systems: Pertinent positive and negative review of systems were noted in the above HPI section.  All other review of systems was otherwise negative.  Physical Exam: Vital signs in last 24 hours: Temp:  [98.1 F (36.7 C)-98.9 F (37.2 C)] 98.4 F (36.9 C) (04/11 1407) Pulse Rate:  [86-107] 98 (04/11 1407) Resp:  [14-20] 18 (04/11 1407) BP: (122-155)/(56-87) 139/68 (04/11 1407) SpO2:  [93 %-100 %] 93 % (04/11 1407) Weight:  [106.1 kg] 106.1 kg (04/11 0934) Last BM Date: 04/08/21 General:   Alert,  Well-developed, well-nourished, elderly African-American female pleasant and cooperative in NAD mildly short of breath with talking Head:  Normocephalic and atraumatic. Eyes:  Sclera clear, no icterus.   Conjunctiva pink. Ears:  Normal auditory acuity. Nose:  No deformity, discharge,  or lesions. Mouth:  No deformity or lesions.   Neck:  Supple; no masses or thyromegaly. Lungs:  Clear throughout to auscultation.   Few expiratory wheezes . Heart:  Regular rate and rhythm; no murmurs, clicks, rubs,  or gallops. Abdomen:  Soft, obese,,nontender, BS active,nonpalp mass or hsm.   Rectal documented heme positive in the ER Msk:  Symmetrical without gross deformities. . Pulses:  Normal pulses noted. Extremities:  Without clubbing or edema. Neurologic:  Alert and  oriented x4;  grossly normal neurologically. Skin:  Intact without significant lesions or rashes.. Psych:  Alert and cooperative. Normal mood and affect.  Intake/Output from previous day: No intake/output data recorded. Intake/Output this shift: No intake/output data recorded.  Lab Results: Recent Labs    04/08/21 0925  WBC 7.0  HGB 7.8*  HCT 25.4*  PLT 346   BMET Recent Labs    04/08/21 0925  NA 135  K 3.9  CL 99  CO2 28  GLUCOSE 263*  BUN <5*  CREATININE 0.57  CALCIUM 8.7*   LFT Recent Labs    04/08/21 0925  PROT 6.7  ALBUMIN 3.5  AST 16  ALT 18  ALKPHOS 69  BILITOT 0.5   PT/INR Recent Labs     04/08/21 0925  LABPROT 26.8*  INR 2.6*   Hepatitis Panel No results for input(s): HEPBSAG, HCVAB, HEPAIGM, HEPBIGM in the last 72  hours.    IMPRESSION:  #63 75 year old African-American female admitted with progressive shortness of breath over the past 2 weeks and found to have significant anemia with hemoglobin of 7.8, normocytic and heme positive on exam. Patient relates prior history of intermittent GI bleeding and has had prior work-ups in [place] where she has just moved from. 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  #2 history of colon polyps 3.  Atrial fibrillation on chronic Xarelto 4.  Insulin-dependent diabetes mellitus 5.  COPD 6.  Coronary artery disease status post CABG 7.  Hypertension 8.  Chronic back pain 9.  Obesity  Plan Heart healthy carb modified diet, will change to clear liquids today prior to procedures Hold Xarelto Daily INR Serial hemoglobins with transfuse for hemoglobin 7.5 or less, would favor transfusing today as she is significantly symptomatic Twice daily PPI IV as doing Will request her records from her gastroenterologist in [place] We will plan for colonoscopy and EGD on Wednesday or Thursday pending normalization of INR. Plan was discussed with the patient, and she is in agreement.  Thank you will follow with you     Kirsty Monjaraz PA-C 04/08/2021, 2:14 PM

## 2021-04-08 NOTE — Consult Note (Signed)
Consultation  Referring Provider:  Fam practice service Primary Care Physician:  Pcp, No Primary Gastroenterologist:  none  Reason for Consultation:  Anemia, hx of melena  HPI: Caitlyn Lawrence is a 75 y.o. female, admitted through the emergency room this morning after presenting with complaints of shortness of breath over the past couple of weeks.  She says the shortness of breath has gotten to the point that she could not walk to the bathroom.  Denies any chest pain.  On further questioning she also related that she had been having tarry black appearing stools.  She apparently is on iron chronically. She has history of atrial fibrillation for which she is on Eliquis, insulin-dependent diabetes mellitus, COPD, chronic back pain, history of coronary artery disease status post CABG and history of hypertension.  Was documented heme positive in the ER. Admitting labs hemoglobin 7.8/hematocrit 25.4, MCV of 83, platelets 316 BUN less than 5/creatinine 0.57 LFTs within normal limits Pro time 26.8/INR 2.6, troponins negative COVID-19 negative Chest x-ray today no acute abnormality  Patient says that she stays on iron supplement chronically so her stools are always dark but over the past several days had noticed more "tarry black" stools.  She denies any abdominal pain or discomfort, no diarrhea, no nausea or vomiting no heartburn or indigestion. Patient relates that she is just in the process of moving to [place] from [facility] and has had GI evaluation there for recurrent GI bleeding.  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  She says the last procedures were about 4 years ago and these were all done by Dr. Chauncey Reading gastroenterology. She has been on Xarelto for several years, she has not been on any aspirin over the past couple of years due to the  recurrent bleeding, uses Tylenol as needed.     Prior to Admission medications   Medication Sig Start Date End Date Taking? Authorizing Provider  albuterol (ACCUNEB) 0.63 MG/3ML nebulizer solution Take 1 ampule by nebulization every 6 (six) hours as needed for wheezing or shortness of breath.   Yes [provider]  amLODipine (NORVASC) 2.5 MG tablet Take 2.5 mg by mouth daily.   Yes [provider]  ferrous sulfate 325 (65 FE) MG tablet Take 325 mg by mouth daily with breakfast.   Yes [provider]  fluticasone furoate-vilanterol (BREO ELLIPTA) 100-25 MCG/INH AEPB Inhale 1 puff into the lungs daily.   Yes [provider]  gabapentin (NEURONTIN) 100 MG capsule Take 200 mg by mouth 3 (three) times daily.   Yes [provider]  HYDROcodone-acetaminophen (NORCO) 10-325 MG tablet Take 1 tablet by mouth 2 (two) times daily as needed (pain).   Yes [provider]  hydrOXYzine (VISTARIL) 25 MG capsule Take 25 mg by mouth as needed for itching.   Yes [provider]  insulin degludec (TRESIBA FLEXTOUCH) 100 UNIT/ML FlexTouch Pen Inject 54 Units into the skin at bedtime.   Yes [provider]  insulin lispro (HUMALOG) 100 UNIT/ML injection Inject 15 Units into the skin 3 (three) times daily with meals.   Yes [provider]  Ipratropium-Albuterol (COMBIVENT RESPIMAT) 20-100 MCG/ACT AERS respimat Inhale 1 puff into the lungs 4 (four) times daily.   Yes [provider]  ipratropium-albuterol (DUONEB) 0.5-2.5 (3) MG/3ML SOLN Take 3 mLs by nebulization 4 (four) times daily.   Yes [provider]  isosorbide mononitrate (IMDUR) 60 MG 24 hr tablet Take 60 mg by mouth every morning.   Yes [provider]  losartan (COZAAR) 100 MG tablet Take 100 mg by mouth daily.   Yes [provider]  lovastatin (MEVACOR) 20 MG tablet Take 20 mg by mouth every evening.   Yes [provider]  meclizine  (ANTIVERT) 12.5 MG tablet Take 12.5 mg by mouth as needed for dizziness.   Yes [provider]  metoprolol succinate (TOPROL-XL) 100 MG 24 hr tablet Take 100 mg by mouth daily. Take with or immediately following a meal.   Yes [provider]  nitroGLYCERIN (NITROSTAT) 0.4 MG SL tablet Place 0.4 mg under the tongue every 5 (five) minutes as needed for chest pain.   Yes [provider]  ondansetron (ZOFRAN) 4 MG tablet Take 4 mg by mouth as needed for nausea or vomiting.   Yes [provider]  polyethylene glycol (MIRALAX / GLYCOLAX) 17 g packet Take 17 g by mouth as needed for mild constipation.   Yes [provider]  rivaroxaban (XARELTO) 20 MG TABS tablet Take 20 mg by mouth every morning.   Yes [provider]  Vitamin D, Ergocalciferol, (DRISDOL) 1.25 MG (50000 UNIT) CAPS capsule Take 50,000 Units by mouth every 7 (seven) days. Mondays   Yes [provider]    Current Facility-Administered Medications  Medication Dose Route Frequency Provider Last Rate Last Admin  . 0.9 %  sodium chloride infusion (Manually program via Guardrails IV Fluids)   Intravenous Once [place], Carley D, DO      . AeroChamber Plus Flo-Vu Medium MISC 1 each  1 each Other Once Aberman, Caroline C, PA-C      . albuterol (PROVENTIL) (2.5 MG/3ML) 0.083% nebulizer solution 2.5 mg  2.5 mg Nebulization Q2H PRN Bloomfield, Carley D, DO      . azithromycin (ZITHROMAX) 500 mg in sodium chloride 0.9 % 250 mL IVPB  500 mg Intravenous Q24H Bloomfield, Carley D, DO       Followed by  . [START ON 04/09/2021] azithromycin (ZITHROMAX) tablet 500 mg  500 mg Oral Daily Bloomfield, Carley D, DO      . fluticasone furoate-vilanterol (BREO ELLIPTA) 100-25 MCG/INH 1 puff  1 puff Inhalation Daily Bloomfield, Carley D, DO      . gabapentin (NEURONTIN) capsule 200 mg  200 mg Oral TID Bloomfield, Carley D, DO      . HYDROcodone-acetaminophen (NORCO) 10-325 MG per tablet 1 tablet  1  tablet Oral BID PRN Bloomfield, Carley D, DO      . insulin aspart (novoLOG) injection 0-20 Units  0-20 Units Subcutaneous TID WC Bloomfield, Carley D, DO      . insulin aspart (novoLOG) injection 0-5 Units  0-5 Units Subcutaneous QHS Bloomfield, Carley D, DO      . insulin glargine (LANTUS) injection 42 Units  42 Units Subcutaneous QHS Pierce, Dwayne A, RPH      . ipratropium-albuterol (DUONEB) 0.5-2.5 (3) MG/3ML nebulizer solution 3 mL  3 mL Nebulization Q6H Bloomfield, Carley D, DO      . metoprolol succinate (TOPROL-XL) 24 hr tablet 100 mg  100 mg Oral Daily Bloomfield, Carley D, DO      . pantoprazole (PROTONIX) injection 40 mg  40 mg Intravenous Q12H Bloomfield, Carley D, DO      . pravastatin (PRAVACHOL) tablet 20 mg  20 mg Oral q1800 Bloomfield, Carley D, DO      . [START ON  04/09/2021] predniSONE (DELTASONE) tablet 40 mg  40 mg Oral Q breakfast Bloomfield, Carley D, DO        Allergies as of 04/08/2021 - Review Complete 04/08/2021  Allergen Reaction Noted  . Penicillins Hives and Itching 04/08/2021  . Sulfa antibiotics Other (See Comments) 04/08/2021    No family history on file.  Social History   Socioeconomic History  . Marital status: Single    Spouse name: Not on file  . Number of children: Not on file  . Years of education: Not on file  . Highest education level: Not on file  Occupational History  . Not on file  Tobacco Use  . Smoking status: Not on file  . Smokeless tobacco: Not on file  Substance and Sexual Activity  . Alcohol use: Not on file  . Drug use: Not on file  . Sexual activity: Not on file  Other Topics Concern  . Not on file  Social History Narrative  . Not on file   Social Determinants of Health   Financial Resource Strain: Not on file  Food Insecurity: Not on file  Transportation Needs: Not on file  Physical Activity: Not on file  Stress: Not on file  Social Connections: Not on file  Intimate Partner Violence: Not on file    Review of  Systems: Pertinent positive and negative review of systems were noted in the above HPI section.  All other review of systems was otherwise negative.  Physical Exam: Vital signs in last 24 hours: Temp:  [98.1 F (36.7 C)-98.9 F (37.2 C)] 98.4 F (36.9 C) (04/11 1407) Pulse Rate:  [86-107] 98 (04/11 1407) Resp:  [14-20] 18 (04/11 1407) BP: (122-155)/(56-87) 139/68 (04/11 1407) SpO2:  [93 %-100 %] 93 % (04/11 1407) Weight:  [106.1 kg] 106.1 kg (04/11 0934) Last BM Date: 04/08/21 General:   Alert,  Well-developed, well-nourished, elderly African-American female pleasant and cooperative in NAD mildly short of breath with talking Head:  Normocephalic and atraumatic. Eyes:  Sclera clear, no icterus.   Conjunctiva pink. Ears:  Normal auditory acuity. Nose:  No deformity, discharge,  or lesions. Mouth:  No deformity or lesions.   Neck:  Supple; no masses or thyromegaly. Lungs:  Clear throughout to auscultation.   Few expiratory wheezes . Heart:  Regular rate and rhythm; no murmurs, clicks, rubs,  or gallops. Abdomen:  Soft, obese,,nontender, BS active,nonpalp mass or hsm.   Rectal documented heme positive in the ER Msk:  Symmetrical without gross deformities. . Pulses:  Normal pulses noted. Extremities:  Without clubbing or edema. Neurologic:  Alert and  oriented x4;  grossly normal neurologically. Skin:  Intact without significant lesions or rashes.. Psych:  Alert and cooperative. Normal mood and affect.  Intake/Output from previous day: No intake/output data recorded. Intake/Output this shift: No intake/output data recorded.  Lab Results: Recent Labs    04/08/21 0925  WBC 7.0  HGB 7.8*  HCT 25.4*  PLT 346   BMET Recent Labs    04/08/21 0925  NA 135  K 3.9  CL 99  CO2 28  GLUCOSE 263*  BUN <5*  CREATININE 0.57  CALCIUM 8.7*   LFT Recent Labs    04/08/21 0925  PROT 6.7  ALBUMIN 3.5  AST 16  ALT 18  ALKPHOS 69  BILITOT 0.5   PT/INR Recent Labs     04/08/21 0925  LABPROT 26.8*  INR 2.6*   Hepatitis Panel No results for input(s): HEPBSAG, HCVAB, HEPAIGM, HEPBIGM in the last 72  hours.    IMPRESSION:  #4 75 year old African-American female admitted with progressive shortness of breath over the past 2 weeks and found to have significant anemia with hemoglobin of 7.8, normocytic and heme positive on exam. Patient relates prior history of intermittent GI bleeding and has had prior work-ups in [place] where she has just moved from. 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  #2 history of colon polyps 3.  Atrial fibrillation on chronic Xarelto 4.  Insulin-dependent diabetes mellitus 5.  COPD 6.  Coronary artery disease status post CABG 7.  Hypertension 8.  Chronic back pain 9.  Obesity  Plan Heart healthy carb modified diet, will change to clear liquids today prior to procedures Hold Xarelto Daily INR Serial hemoglobins with transfuse for hemoglobin 7.5 or less, would favor transfusing today as she is significantly symptomatic Twice daily PPI IV as doing Will request her records from her gastroenterologist in [place] We will plan for colonoscopy and EGD on Wednesday or Thursday pending normalization of INR. Plan was discussed with the patient, and she is in agreement.  Thank you will follow with you     Jullianna Gabor PA-C 04/08/2021, 2:14 PM

## 2021-04-08 NOTE — ED Triage Notes (Signed)
C/O shortness of breath; stated hx of CHF and heart surgery. States her ankles are more swolen than usual as well.

## 2021-04-08 NOTE — ED Notes (Signed)
[facility] RN aware that report has been called and pt can be moved to her inhouse room

## 2021-04-08 NOTE — ED Notes (Signed)
ED Provider at bedside, PA [facility]

## 2021-04-08 NOTE — ED Notes (Signed)
Message was sent to pharmacy to request aero chamber

## 2021-04-08 NOTE — ED Notes (Signed)
ED Provider at bedside. 

## 2021-04-08 NOTE — Progress Notes (Signed)
Subjective:   Yesterday afternoon, patient received 1 unit of packed red blood cells.  Overnight, no acute events.  This morning, Caitlyn Lawrence states she has sudden onset right sided posterior chest pain that went around to her right breast; pain was sharp and lasted just a few seconds. Otherwise, she states her breathing is improved today. She has seen a GI doctor before; reports she had blood vessels cauterized and that relieved her symptoms. Additionally, she had a few polyps removed.   Objective:  Vital signs in last 24 hours: Vitals:   04/08/21 2200 04/09/21 0618 04/09/21 0805 04/09/21 1010  BP: 135/83 (!) 143/70  111/62  Pulse: 94 86  70  Resp: 18 18 18 18   Temp: 98.3 F (36.8 C) 98.1 F (36.7 C)  97.9 F (36.6 C)  TempSrc: Oral Oral  Oral  SpO2: 97% 92%  94%  Weight:      Height:      On room air  Intake/Output Summary (Last 24 hours) at 04/09/2021 1055 Last data filed at 04/08/2021 2200 Gross per 24 hour  Intake 720 ml  Output --  Net 720 ml   Filed Weights   04/08/21 0934 04/08/21 1407  Weight: 106.1 kg 110 kg   Physical Exam Constitutional:      General: She is not in acute distress.    Appearance: She is obese.  Cardiovascular:     Rate and Rhythm: Normal rate and regular rhythm.  Pulmonary:     Breath sounds: Decreased breath sounds present. No wheezing or rales.  Musculoskeletal:     Right lower leg: Edema present.     Left lower leg: Edema present.     Comments: 1+ pitting edema of bilateral lower extremities  Neurological:     General: No focal deficit present.     Mental Status: She is alert and oriented to person, place, and time.  Psychiatric:        Mood and Affect: Mood normal.        Behavior: Behavior normal.   Labs in last 24 hours:  CBC Latest Ref Rng & Units 04/09/2021 04/08/2021 04/08/2021  WBC 4.0 - 10.5 K/uL 10.2 - -  Hemoglobin 12.0 - 15.0 g/dL 8.0(L) 8.3(L) 7.6(L)  Hematocrit 36.0 - 46.0 % 25.0(L) 26.2(L) 24.5(L)  Platelets 150 -  400 K/uL 295 - -   INR - 1.4 today from 2.6 Hemoglobin and hematocrit - 8.3 from 7.6 Glucose - 331 from 341, 460, 273 Hemoglobin A1c - 6.3 Iron and TIBC:  -Iron 15 -TIBC 528 -Saturation Ratios 3 -UIBC 513 Ferritin - 6  Imaging following admission: No further imaging  Assessment/Plan:  Principal Problem:   COPD with acute exacerbation (HCC) Active Problems:   Symptomatic anemia   Insulin dependent type 2 diabetes mellitus (HCC)   S/P CABG x 3   HTN (hypertension)   Heme positive stool   History of GI bleed  Caitlyn Lawrence is a 75 year old woman with past medical history significant for PAF on Eliquis, insulin-dependent type II DM, COPD, chronic back pain, CAD s/p triple bypass, and HTN who presents with progressive dyspnea on exertion that she first noticed two weeks ago. She was admitted for further work-up and management.  Symptomatic iron deficiency anemia, active Patient evaluated by gastroenterology with plan to undergo endoscopy and colonoscopy for identification of source of GI blood loss. Patient's hemoglobin stable at approximately 8 this morning. Patient has had rivaroxaban held and INR is now 1.4. Appreciate gastroenterology support of  this problem. -Continue holding rivaroxaban -Clear liquids now, NPO at midnight, bowel prep this evening -Continue IV pantoprazole 40mg  twice daily -Feraheme 1 dose today -Daily CBC, transfuse to maintain hemoglobin >8 -Review records from [place], [place] gastroenterologist  #COPD exacerbation, active Patient reports that her breathing is significantly improved since admission. She has no wheezing on examination. Patient would benefit from de-escalation of her regimen with discontinuation of prednisone as it is causing a significant exacerbation of her hyperglycemia. -Discontinue prednisone -Continue azithromycin for five day course (day 2/5) -Continue scheduled Duonebs three times daily -Continue albuterol nebulizer every two hours  PRN -Continue Breo Ellipta 1 puff daily  #Insulin-dependent type II DM, chronic Patient's blood glucose readings significantly exacerbated in the setting of systemic corticosteroid use. Agree with diabetes coordinator recommendations. Will continue to monitor closely for further modifications as prednisone is being discontinued. -Increase lantus to 54 units daily -Start novolog 8 units three times daily with meals -Resistant SSI three times daily with meals and 0-5 units daily at bedtime  #HTN, chronic #CAD status post triple bypass, chronic Patient's blood pressure relatively well controlled with holding of her home amlodipine 2.5mg  daily and losartan 100mg  daily -Continue home metoprolol 100mg  daily -Continue holding amlodipine and losartan  #Paroxysmal atrial fibrillation, chronic Patient's heart rate well controlled.  -Continue home metoprolol 100mg  daily  #VTE ppx: SCDs #Diet: Clears, NPO at midnight #Code status: DNR  Prior to Admission Living Arrangement: Home Anticipated Discharge Location: Home Barriers to Discharge: Continued medical workup Dispo: Anticipated discharge in approximately 2-3 day(s).   Cato Mulligan, MD 04/09/2021, 10:55 AM Pager: [phone] After 5pm on weekdays and 1pm on weekends: On Call pager [phone]

## 2021-04-08 NOTE — ED Notes (Signed)
Pt was moved to yellow zone per request of charge nurse

## 2021-04-08 NOTE — ED Notes (Signed)
I tried to call report, Wyvonnia Lora, RN charge states floor nurse to call me back

## 2021-04-08 NOTE — Progress Notes (Signed)
NEW ADMISSION NOTE New Admission Note:   Arrival Method: stretcher Mental Orientation: A&O X4 Telemetry: 5M20 Assessment: Completed Skin: intact IV: RFA Pain: 4/10 Tubes: none  Safety Measures: Safety Fall Prevention Plan has been given, discussed and signed Admission: Completed [facility] Orientation: Patient has been orientated to the room, unit and staff.  Family: none   Orders have been reviewed and implemented. Will continue to monitor the patient. Call light has been placed within reach and bed alarm has been activated.   Rozlyn Yerby S Traylen Eckels, RN

## 2021-04-08 NOTE — H&P (Signed)
Date: 04/08/2021               Patient Name:  Caitlyn Lawrence MRN: [id number]  DOB: 1946/02/09 Age / Sex: 75 y.o., female   PCP: Pcp, No         Medical Service: Internal Medicine Teaching Service         Attending Physician: Dr. Velna Ochs, MD    First Contact: Dr. Wynetta Emery Pager: [phone]  Second Contact: Dr. Charleen Kirks Pager: [phone]       After Hours (After 5p/  First Contact Pager: [phone]  weekends / holidays): Second Contact Pager: [phone]   Chief Complaint: shortness of breath   History of Present Illness: Caitlyn Lawrence is a 75 y/o female with history of PAF on Eliquis, insulin-dependent type II DM, COPD, chronic back pain, CAD s/p triple bypass, and HTN who presents with progressive dyspnea on exertion that she first noticed two weeks ago. She endorses associated increased in productive cough from baseline and wheezing. She has been using her rescue inhaler more often than normal. She will have to sit down for several minutes after walking from her bedroom to the kitchen. Caitlyn Lawrence states she cannot talk normally for a few minutes after basic exertion due to her shortness of breath. She has been experiencing her typical seasonal allergies, but no recent illness or exposure to sick contacts. Denies chest pain, orthopnea, PND, worsening lower extremity edema from baseline, or changes in urination.  Additionally, she noticed her stools became tarry several days ago. Her stools are chronically dark on iron supplementation, but she noticed a consistency change last week. She has noticed light headedness, but no syncope. She denies any abdominal pain, n/v, pain with BMs, or recent weight loss. She has had upper and lower endoscopies several years ago, but cannot recall the results. She is not on chronic PPI. She has not been taking NSAIDs. She is on chronic xarelto as mentioned above.   Meds:  Current Meds  Medication Sig  . albuterol (ACCUNEB) 0.63 MG/3ML nebulizer  solution Take 1 ampule by nebulization every 6 (six) hours as needed for wheezing or shortness of breath.  Marland Kitchen amLODipine (NORVASC) 2.5 MG tablet Take 2.5 mg by mouth daily.  . ferrous sulfate 325 (65 FE) MG tablet Take 325 mg by mouth daily with breakfast.  . fluticasone furoate-vilanterol (BREO ELLIPTA) 100-25 MCG/INH AEPB Inhale 1 puff into the lungs daily.  Marland Kitchen gabapentin (NEURONTIN) 100 MG capsule Take 200 mg by mouth 3 (three) times daily.  Marland Kitchen HYDROcodone-acetaminophen (NORCO) 10-325 MG tablet Take 1 tablet by mouth 2 (two) times daily as needed (pain).  . hydrOXYzine (VISTARIL) 25 MG capsule Take 25 mg by mouth as needed for itching.  . insulin degludec (TRESIBA FLEXTOUCH) 100 UNIT/ML FlexTouch Pen Inject 54 Units into the skin at bedtime.  . insulin lispro (HUMALOG) 100 UNIT/ML injection Inject 15 Units into the skin 3 (three) times daily with meals.  . Ipratropium-Albuterol (COMBIVENT RESPIMAT) 20-100 MCG/ACT AERS respimat Inhale 1 puff into the lungs 4 (four) times daily.  Marland Kitchen ipratropium-albuterol (DUONEB) 0.5-2.5 (3) MG/3ML SOLN Take 3 mLs by nebulization 4 (four) times daily.  . isosorbide mononitrate (IMDUR) 60 MG 24 hr tablet Take 60 mg by mouth every morning.  Marland Kitchen losartan (COZAAR) 100 MG tablet Take 100 mg by mouth daily.  Marland Kitchen lovastatin (MEVACOR) 20 MG tablet Take 20 mg by mouth every evening.  . meclizine (ANTIVERT) 12.5 MG tablet Take 12.5 mg by mouth as needed for  dizziness.  . metoprolol succinate (TOPROL-XL) 100 MG 24 hr tablet Take 100 mg by mouth daily. Take with or immediately following a meal.  . nitroGLYCERIN (NITROSTAT) 0.4 MG SL tablet Place 0.4 mg under the tongue every 5 (five) minutes as needed for chest pain.  Marland Kitchen ondansetron (ZOFRAN) 4 MG tablet Take 4 mg by mouth as needed for nausea or vomiting.  . polyethylene glycol (MIRALAX / GLYCOLAX) 17 g packet Take 17 g by mouth as needed for mild constipation.  . rivaroxaban (XARELTO) 20 MG TABS tablet Take 20 mg by mouth every  morning.  . Vitamin D, Ergocalciferol, (DRISDOL) 1.25 MG (50000 UNIT) CAPS capsule Take 50,000 Units by mouth every 7 (seven) days. Mondays     Allergies: Allergies as of 04/08/2021 - Review Complete 04/08/2021  Allergen Reaction Noted  . Penicillins Hives and Itching 04/08/2021  . Sulfa antibiotics Other (See Comments) 04/08/2021   No past medical history on file.  Family History: positive for DM, HTN, CAD. Negative for colon cancer  Social History: Recently moved back to [place] to live with her son. She has smoked cigarettes for 60 years, cut down to 1/2 ppd. 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 Denies illicit substance use   Review of Systems: A complete ROS was negative except as per HPI.   Physical Exam: Blood pressure 122/70, pulse 93, temperature 98.1 F (36.7 C), temperature source Oral, resp. rate 19, height 5\' 4"  (1.626 m), weight 106.1 kg, SpO2 98 %. General: awake, alert, pleasant woman sitting up in bed in NAD HEENT: [place]/AT. Conjunctival pallor. Moist mucous membranes. No oropharyngeal erythema or exudates Neck: supple; no LAD. No JVD CV: tachycardic, regular rhythm. No m/r/g Pulm: normal work of breathing, talking in complete sentences. Decrease air movement throughout. No audible wheezes Abd: BS+; abdomen soft, non-distended, non-tender Ext: warm and well perfused; 1+ pitting edema bilaterally  Neuro: A&Ox4; non-focal  Psych: pleasant mood and affect   EKG: personally reviewed my interpretation is sinus tachycardia with RBBB. No acute ischemia   CXR: personally reviewed my interpretation is loop recorder in place. No effusions, or focal consolidation   Assessment & Plan by Problem: Principal Problem:   COPD with acute exacerbation (HCC) Active Problems:   Symptomatic anemia   Insulin dependent type 2 diabetes mellitus (HCC)   S/P CABG x 3   HTN (hypertension)  Caitlyn Lawrence is a 75 y/o female with history of PAF on Eliquis,  insulin-dependent type II DM, COPD, chronic back pain, CAD s/p triple bypass, and HTN who presents with progressive dyspnea on exertion that she first noticed two weeks ago. She was admitted for further work-up and management  COPD with exacerbation -her dyspnea on exertion is likely multifactorial in the setting of COPD exacerbation and symptomatic anemia (see below) -she is breathing well on room air and had symptomatic improvement with steroids and breathing treatment -will plan for 5 day course of Prednisone and azithro -continue home Breo -scheduled duonebs q 6 with additional albuterol neb available as needed  Symptomatic anemia -hgb on admission is 7.8  -she reports a history of chronic IDA for which she takes oral iron supplementation (cannot recall etiology). Her hgb usually runs between 9 and 11. Does report that is has dropped to 6-7 requiring blood transfusions -she is tachycardic but otherwise HDS at this time -FOBT + in ED with reports of melanotic stools for the past several days raising suspicion for upper GI bleed. She is on  chronic anticoagulation for PAF. No reported NSAID use.  -GI has been consulted, appreciate recommendations -holding DOAC and will keep her on clear liquids for now until decision is made regarding endoscopy  -will start IV protonix q 12 hrs -given her symptoms, will go ahead and transfuse 1 unit pRBCs -checking iron studies   HTN CAD s/p triple bypass  -holding home anti-hypertensives in the setting of UGIB -blood pressure is stable; can resume medications as needed  -no anginal symptoms   PAF -holding anticoagulation in the setting of above -continue home Metoprolol  Insulin-dependent type II DM -on Tresiba 54 units daily at home; decrease to 42 units while on clear liquid diet  -SSI   DVT ppx: SCDs Diet: clears CODE: DNR  Dispo: Admit patient to Inpatient with expected length of stay greater than 2 midnights.  SignedDelice Bison, DO 04/08/2021, 1:52 PM  Pager: [phone] After 5pm on weekdays and 1pm on weekends: On Call pager: [phone]

## 2021-04-08 NOTE — ED Provider Notes (Addendum)
[facility]PART[facility] Provider Note   CSN: [id number] Arrival date & time: 04/08/21  6063     History Chief Complaint  Patient presents with  . Shortness of Breath    Caitlyn Lawrence is a 75 y.o. female with a past medical history significant for COPD, hypertension, insulin-dependent diabetes, hyperlipidemia, congestive heart failure not currently on any diuretics, history of CABG in 2005, and paroxysmal atrial fibrillation on chronic Xarelto who presents to the ED due to worsening shortness of breath over the past few months.  Patient states shortness of breath has significantly worsened over the past 5 days associated with wheeze and increased lower extremity edema.  She recently moved here from a different county in [place]. She also endorses productive cough with white and rhinorrhea.  Denies fever and chills.  She has received both her COVID-19 vaccinations and booster shot.  Shortness of breath worse with exertion.  Patient states shortness of breath is "bad all the time" and not specifically worsen when lying flat at night. Denies associated chest pain.  Denies history of blood clots, recent surgeries, recent long immobilizations except 3 hour car ride, and hemoptysis. Admits to tobacco use. She is currently on albuterol, BREO, and combivent for COPD. No relief with COPD medications.   History obtained from patient and past medical records. No interpreter used during encounter.      No past medical history on file.  Patient Active Problem List   Diagnosis Date Noted  . COPD with acute exacerbation ([place]) 04/08/2021      OB History   No obstetric history on file.     No family history on file.     Home Medications Prior to Admission medications   Medication Sig Start Date End Date Taking? Authorizing Provider  albuterol (ACCUNEB) 0.63 MG/3ML nebulizer solution Take 1 ampule by nebulization every 6 (six) hours as needed for wheezing or  shortness of breath.   Yes [provider]  amLODipine (NORVASC) 2.5 MG tablet Take 2.5 mg by mouth daily.   Yes [provider]  ferrous sulfate 325 (65 FE) MG tablet Take 325 mg by mouth daily with breakfast.   Yes [provider]  fluticasone furoate-vilanterol (BREO ELLIPTA) 100-25 MCG/INH AEPB Inhale 1 puff into the lungs daily.   Yes [provider]  gabapentin (NEURONTIN) 100 MG capsule Take 200 mg by mouth 3 (three) times daily.   Yes [provider]  HYDROcodone-acetaminophen (NORCO) 10-325 MG tablet Take 1 tablet by mouth 2 (two) times daily as needed (pain).   Yes [provider]  hydrOXYzine (VISTARIL) 25 MG capsule Take 25 mg by mouth as needed for itching.   Yes [provider]  insulin degludec (TRESIBA FLEXTOUCH) 100 UNIT/ML FlexTouch Pen Inject 54 Units into the skin at bedtime.   Yes [provider]  insulin lispro (HUMALOG) 100 UNIT/ML injection Inject 15 Units into the skin 3 (three) times daily with meals.   Yes [provider]  Ipratropium-Albuterol (COMBIVENT RESPIMAT) 20-100 MCG/ACT AERS respimat Inhale 1 puff into the lungs 4 (four) times daily.   Yes [provider]  ipratropium-albuterol (DUONEB) 0.5-2.5 (3) MG/3ML SOLN Take 3 mLs by nebulization 4 (four) times daily.   Yes [provider]  isosorbide mononitrate (IMDUR) 60 MG 24 hr tablet Take 60 mg by mouth every morning.   Yes [provider]  losartan (COZAAR) 100 MG tablet Take 100 mg by mouth daily.   Yes [provider]  lovastatin (MEVACOR) 20 MG tablet Take 20 mg by mouth every evening.   Yes [provider]  meclizine (ANTIVERT) 12.5 MG tablet Take 12.5 mg by mouth as needed for dizziness.   Yes [provider]  metoprolol succinate (TOPROL-XL) 100 MG 24 hr tablet Take 100 mg by mouth daily. Take with or immediately following a meal.   Yes [provider]  nitroGLYCERIN  (NITROSTAT) 0.4 MG SL tablet Place 0.4 mg under the tongue every 5 (five) minutes as needed for chest pain.   Yes [provider]  ondansetron (ZOFRAN) 4 MG tablet Take 4 mg by mouth as needed for nausea or vomiting.   Yes [provider]  polyethylene glycol (MIRALAX / GLYCOLAX) 17 g packet Take 17 g by mouth as needed for mild constipation.   Yes [provider]  rivaroxaban (XARELTO) 20 MG TABS tablet Take 20 mg by mouth every morning.   Yes [provider]  Vitamin D, Ergocalciferol, (DRISDOL) 1.25 MG (50000 UNIT) CAPS capsule Take 50,000 Units by mouth every 7 (seven) days. Mondays   Yes [provider]    Allergies    Penicillins and Sulfa antibiotics  Review of Systems   Review of Systems  Constitutional: Negative for chills and fever.  HENT: Positive for rhinorrhea.   Respiratory: Positive for cough and shortness of breath.   Cardiovascular: Positive for leg swelling. Negative for chest pain.  Gastrointestinal: Negative for abdominal pain, diarrhea, nausea and vomiting.  All other systems reviewed and are negative.   Physical Exam Updated Vital Signs BP 137/87 (BP Location: Right Arm)   Pulse (!) 107   Temp 98.9 F (37.2 C) (Oral)   Resp 20   Ht 5\' 4"  (1.626 m)   Wt 106.1 kg   SpO2 100%   BMI 40.17 kg/m   Physical Exam Vitals and nursing note reviewed.  Constitutional:      General: She is not in acute distress.    Appearance: She is not ill-appearing.  HENT:     Head: Normocephalic.  Eyes:     Pupils: Pupils are equal, round, and reactive to light.  Cardiovascular:     Rate and Rhythm: Normal rate and regular rhythm.     Pulses: Normal pulses.     Heart sounds: Normal heart sounds. No murmur heard. No friction rub. No gallop.   Pulmonary:     Effort: Pulmonary effort is normal.     Breath sounds: Wheezing present.     Comments: Decreased air movement throughout. Expiratory wheeze. No accessory muscle usage.  Speaking in full sentences.  Abdominal:     General: Abdomen is flat. There is no distension.     Palpations: Abdomen is soft.     Tenderness: There is no abdominal tenderness. There is no guarding or rebound.  Musculoskeletal:        General: Normal range of motion.     Cervical back: Neck supple.     Comments: 2+ pitting edema bilaterally  Skin:    General: Skin is warm and dry.  Neurological:     General: No focal deficit present.     Mental Status: She is alert.  Psychiatric:        Mood and Affect: Mood normal.        Behavior: Behavior normal.     ED Results / Procedures / Treatments   Labs (all labs ordered are listed, but only abnormal results are displayed) Labs Reviewed  CBC WITH  DIFFERENTIAL/PLATELET - Abnormal; Notable for the following components:      Result Value   RBC 3.04 (*)    Hemoglobin 7.8 (*)    HCT 25.4 (*)    MCH 25.7 (*)    nRBC 0.4 (*)    All other components within normal limits  COMPREHENSIVE METABOLIC PANEL - Abnormal; Notable for the following components:   Glucose, Bld 263 (*)    BUN <5 (*)    Calcium 8.7 (*)    All other components within normal limits  PROTIME-INR - Abnormal; Notable for the following components:   Prothrombin Time 26.8 (*)    INR 2.6 (*)    All other components within normal limits  BRAIN NATRIURETIC PEPTIDE - Abnormal; Notable for the following components:   B Natriuretic Peptide 108.0 (*)    All other components within normal limits  URINALYSIS, ROUTINE W REFLEX MICROSCOPIC - Abnormal; Notable for the following components:   Color, Urine STRAW (*)    Hgb urine dipstick SMALL (*)    All other components within normal limits  POC OCCULT BLOOD, ED - Abnormal; Notable for the following components:   Fecal Occult Bld POSITIVE (*)    All other components within normal limits  CBG MONITORING, ED - Abnormal; Notable for the following components:   Glucose-Capillary 273 (*)    All other components within normal limits   RESP PANEL BY RT-PCR (FLU A&B, COVID) ARPGX2  HIV ANTIBODY (ROUTINE TESTING W REFLEX)  FERRITIN  IRON AND TIBC  TROPONIN I (HIGH SENSITIVITY)  TROPONIN I (HIGH SENSITIVITY)    EKG EKG Interpretation  Date/Time:  Monday April 08 2021 08:54:52 EDT Ventricular Rate:  100 PR Interval:  124 QRS Duration: 142 QT Interval:  374 QTC Calculation: 483 R Axis:   34 Text Interpretation: Sinus tachycardia Right bundle branch block Confirmed by Davonna Belling [phone]) on 04/08/2021 10:39:18 AM   Radiology DG Chest Portable 1 View  Result Date: 04/08/2021 CLINICAL DATA:  Cough and shortness of breath EXAM: PORTABLE CHEST 1 VIEW COMPARISON:  None. FINDINGS: Cardiac shadows within normal limits. Lungs are well aerated bilaterally. No focal infiltrate or sizable effusion is seen. Loop recorder and postsurgical changes noted. IMPRESSION: No acute abnormality noted. Electronically Signed   By: Inez Catalina M.D.   On: 04/08/2021 09:41    Procedures .Critical Care Performed by: Suzy Bouchard, PA-C Authorized by: Suzy Bouchard, PA-C   Critical care provider statement:    Critical care time (minutes):  38   Critical care was necessary to treat or prevent imminent or life-threatening deterioration of the following conditions:  Respiratory failure, circulatory failure and metabolic crisis   Critical care was time spent personally by me on the following activities:  Discussions with consultants, evaluation of patient's response to treatment, examination of patient, ordering and performing treatments and interventions, ordering and review of laboratory studies, ordering and review of radiographic studies, pulse oximetry, re-evaluation of patient's condition, obtaining history from patient or surrogate and review of old charts   I assumed direction of critical care for this patient from another provider in my specialty: no     Care discussed with: admitting provider       Medications  Ordered in ED Medications  AeroChamber Plus Flo-Vu Medium MISC 1 each (1 each Other Not Given 04/08/21 1153)  HYDROcodone-acetaminophen (NORCO) 10-325 MG per tablet 1 tablet (has no administration in time range)  pravastatin (PRAVACHOL) tablet 20 mg (has no administration in time range)  metoprolol succinate (TOPROL-XL) 24 hr tablet 100 mg (has no administration in time range)  insulin degludec (TRESIBA) 100 UNIT/ML FlexTouch Pen 42 Units (has no administration in time range)  gabapentin (NEURONTIN) capsule 200 mg (has no administration in time range)  fluticasone furoate-vilanterol (BREO ELLIPTA) 100-25 MCG/INH 1 puff (has no administration in time range)  azithromycin (ZITHROMAX) 500 mg in sodium chloride 0.9 % 250 mL IVPB (has no administration in time range)    Followed by  azithromycin (ZITHROMAX) tablet 500 mg (has no administration in time range)  predniSONE (DELTASONE) tablet 40 mg (has no administration in time range)  ipratropium-albuterol (DUONEB) 0.5-2.5 (3) MG/3ML nebulizer solution 3 mL (has no administration in time range)  albuterol (PROVENTIL) (2.5 MG/3ML) 0.083% nebulizer solution 2.5 mg (has no administration in time range)  albuterol (VENTOLIN HFA) 108 (90 Base) MCG/ACT inhaler 4 puff (4 puffs Inhalation Given 04/08/21 0923)  methylPREDNISolone sodium succinate (SOLU-MEDROL) 125 mg/2 mL injection 125 mg (125 mg Intravenous Given 04/08/21 0998)    ED Course  I have reviewed the triage vital signs and the nursing notes.  Pertinent labs & imaging results that were available during my care of the patient were reviewed by me and considered in my medical decision making (see chart for details).  Clinical Course as of 04/08/21 1237  Mon Apr 08, 2021  1016 Hemoglobin(!): 7.8 [CA]  1045 Fecal Occult Blood, POC(!): POSITIVE [CA]  1045 B Natriuretic Peptide(!): 108.0 [CA]  1045 Glucose(!): 263 [CA]    Clinical Course User Index [CA] Suzy Bouchard, PA-C   MDM  Rules/Calculators/A&P                         75 year old female with a past medical history significant for COPD and congestive heart failure who presents to the ED due to worsening shortness of breath over the past few months that has significantly worsened over the past 5 days associated with wheeze and lower extremity edema. She is on chronic Xarelto for paroxysmal A. fib.  Upon arrival, patient afebrile, not tachycardic or hypoxic.  Patient in no acute distress and nontoxic-appearing.  Physical exam significant for expiratory wheeze and decreased air movement.  2+ pitting edema bilaterally.  Routine labs, troponin, BNP, chest x-ray, and EKG ordered at triage.  Covid test added to rule out infection.  IV Solu-Medrol and albuterol given for symptomatic relief.  Suspect symptoms related to CHF exacerbation vs. COPD exacerbation. Lower suspicion for PE given patient has been compliant with xaerlto. Discussed case with Dr. Alvino Chapel who evaluated patient at bedside and agrees with assessment and plan.   CBC significant for anemia with hemoglobin at 7.8.  No leukocytosis.  Patient has a history of anemia and has had frequent blood transfusions.  Rectal exam performed with chaperone in room.  No external hemorrhoids or visible fissures.  Patient states she has been having melena for the past few days.  Denies hematochezia and hematemesis. History of diverticulosis and PUD. No chronic NSAIDs or alcohol. Chest x-ray personally reviewed which is negative for signs pneumonia, pneumothorax, or or widened mediastinum.  Respiratory panel negative.  BNP mildly elevated at 108.  CMP significant for hyperglycemia to 63 with no anion gap.  Doubt DKA.  Fecal occult positive.  Troponin normal at 10.  Doubt ACS.  EKG personally reviewed which demonstrates sinus tachycardia with right bundle branch block.  No signs of acute ischemia. Suspect shortness of breath multifactorial with COPD, CHF, and possible symptomatic  anemia.  Shortness of breath improvement after steroids and albuterol.  Discussed case with Azucena Freed, PA-C with GI who will see patient. Discussed case with Dr. Philipp Ovens who agrees to admit patient for further treatment.  Final Clinical Impression(s) / ED Diagnoses Final diagnoses:  COPD exacerbation ([place])  Symptomatic anemia    Rx / DC Orders ED Discharge Orders    None       Karie Kirks 04/08/21 1238    Davonna Belling, MD 04/10/21 0754    Suzy Bouchard, PA-C 05/06/21 Jerolyn Shin, MD 05/07/21 [phone]

## 2021-04-09 ENCOUNTER — Encounter (HOSPITAL_COMMUNITY): Payer: Self-pay | Admitting: Internal Medicine

## 2021-04-09 DIAGNOSIS — D509 Iron deficiency anemia, unspecified: Secondary | ICD-10-CM

## 2021-04-09 DIAGNOSIS — R195 Other fecal abnormalities: Secondary | ICD-10-CM | POA: Diagnosis not present

## 2021-04-09 DIAGNOSIS — Z794 Long term (current) use of insulin: Secondary | ICD-10-CM

## 2021-04-09 DIAGNOSIS — I480 Paroxysmal atrial fibrillation: Secondary | ICD-10-CM

## 2021-04-09 DIAGNOSIS — R600 Localized edema: Secondary | ICD-10-CM

## 2021-04-09 DIAGNOSIS — J441 Chronic obstructive pulmonary disease with (acute) exacerbation: Principal | ICD-10-CM

## 2021-04-09 DIAGNOSIS — D500 Iron deficiency anemia secondary to blood loss (chronic): Secondary | ICD-10-CM

## 2021-04-09 DIAGNOSIS — I2510 Atherosclerotic heart disease of native coronary artery without angina pectoris: Secondary | ICD-10-CM

## 2021-04-09 DIAGNOSIS — K921 Melena: Secondary | ICD-10-CM | POA: Diagnosis not present

## 2021-04-09 DIAGNOSIS — I110 Hypertensive heart disease with heart failure: Secondary | ICD-10-CM | POA: Diagnosis not present

## 2021-04-09 DIAGNOSIS — E119 Type 2 diabetes mellitus without complications: Secondary | ICD-10-CM

## 2021-04-09 DIAGNOSIS — D62 Acute posthemorrhagic anemia: Secondary | ICD-10-CM | POA: Diagnosis not present

## 2021-04-09 DIAGNOSIS — Z951 Presence of aortocoronary bypass graft: Secondary | ICD-10-CM

## 2021-04-09 LAB — TYPE AND SCREEN
ABO/RH(D): AB POS
Antibody Screen: NEGATIVE
Unit division: 0

## 2021-04-09 LAB — GLUCOSE, CAPILLARY
Glucose-Capillary: 341 mg/dL — ABNORMAL HIGH (ref 70–99)
Glucose-Capillary: 331 mg/dL — ABNORMAL HIGH (ref 70–99)
Glucose-Capillary: 204 mg/dL — ABNORMAL HIGH (ref 70–99)
Glucose-Capillary: 177 mg/dL — ABNORMAL HIGH (ref 70–99)
Glucose-Capillary: 188 mg/dL — ABNORMAL HIGH (ref 70–99)

## 2021-04-09 LAB — CBC
HCT: 25 % — ABNORMAL LOW (ref 36.0–46.0)
Hemoglobin: 8 g/dL — ABNORMAL LOW (ref 12.0–15.0)
MCH: 26 pg (ref 26.0–34.0)
MCHC: 32 g/dL (ref 30.0–36.0)
MCV: 81.2 fL (ref 80.0–100.0)
Platelets: 295 10*3/uL (ref 150–400)
RBC: 3.08 MIL/uL — ABNORMAL LOW (ref 3.87–5.11)
RDW: 16.1 % — ABNORMAL HIGH (ref 11.5–15.5)
WBC: 10.2 10*3/uL (ref 4.0–10.5)
nRBC: 0 % (ref 0.0–0.2)

## 2021-04-09 LAB — BPAM RBC
Blood Product Expiration Date: 202205152359
ISSUE DATE / TIME: 202204111737
Unit Type and Rh: 8400

## 2021-04-09 LAB — PROTIME-INR
INR: 1.4 — ABNORMAL HIGH (ref 0.8–1.2)
Prothrombin Time: 16.5 seconds — ABNORMAL HIGH (ref 11.4–15.2)

## 2021-04-09 MED ORDER — PEG-KCL-NACL-NASULF-NA ASC-C 100 G PO SOLR
0.5000 | Freq: Once | ORAL | Status: AC
  Administered 2021-04-10: 100 g via ORAL
  Filled 2021-04-09: qty 1

## 2021-04-09 MED ORDER — INSULIN ASPART 100 UNIT/ML ~~LOC~~ SOLN
8.0000 [IU] | Freq: Three times a day (TID) | SUBCUTANEOUS | Status: DC
  Administered 2021-04-09 – 2021-04-10 (×3): 8 [IU] via SUBCUTANEOUS

## 2021-04-09 MED ORDER — INSULIN GLARGINE 100 UNIT/ML ~~LOC~~ SOLN
54.0000 [IU] | Freq: Every day | SUBCUTANEOUS | Status: DC
  Administered 2021-04-09: 54 [IU] via SUBCUTANEOUS
  Filled 2021-04-09 (×2): qty 0.54

## 2021-04-09 MED ORDER — PEG-KCL-NACL-NASULF-NA ASC-C 100 G PO SOLR: 1.0000 | Freq: Once | ORAL | Status: DC

## 2021-04-09 MED ORDER — PEG-KCL-NACL-NASULF-NA ASC-C 100 G PO SOLR
0.5000 | Freq: Once | ORAL | Status: AC
  Administered 2021-04-09: 100 g via ORAL
  Filled 2021-04-09: qty 1

## 2021-04-09 MED ORDER — SODIUM CHLORIDE 0.9 % IV SOLN
510.0000 mg | Freq: Once | INTRAVENOUS | Status: AC
  Administered 2021-04-09: 510 mg via INTRAVENOUS
  Filled 2021-04-09: qty 17

## 2021-04-09 NOTE — Progress Notes (Signed)
RN successfully faxed medical document release form to obtain medical records from [facility] requested facility listed in chart at 1305, still waiting on response from the facility.

## 2021-04-09 NOTE — Plan of Care (Signed)

## 2021-04-09 NOTE — Progress Notes (Addendum)
Patient ID: Caitlyn Lawrence, female   DOB: [birth year], 75 y.o.   MRN: [id number]    Progress Note   Subjective  Day #2 CC; GI bleeding  Xarelto on hold  INR down to 1.4 today/pro time 16.5 Hemoglobin 8/hematocrit 25-stable, after 1 unit packed RBCs yesterday. Ferritin 6 Received Feraheme x1  Patient reports no further bowel movements or evidence of bleeding.  Records were requested yesterday but not obtained   Objective   Vital signs in last 24 hours: Temp:  [97.9 F (36.6 C)-98.5 F (36.9 C)] 97.9 F (36.6 C) (04/12 1010) Pulse Rate:  [70-107] 70 (04/12 1010) Resp:  [18-20] 18 (04/12 1010) BP: (111-143)/(56-87) 111/62 (04/12 1010) SpO2:  [92 %-100 %] 94 % (04/12 1010) Weight:  [110 kg] 110 kg (04/11 1407) Last BM Date: 04/08/21 General:    Elderly African-American female female in NAD Heart:  Regular rate and rhythm; no murmurs Lungs: Respirations even and unlabored, decreased breath sounds bilaterally Abdomen:  Soft, nontender and nondistended. Normal bowel sounds. Extremities:  Without edema. Neurologic:  Alert and oriented,  grossly normal neurologically. Psych:  Cooperative. Normal mood and affect.  Intake/Output from previous day: 04/11 0701 - 04/12 0700 In: 720 [P.O.:300; I.V.:100; Blood:320] Out: -  Intake/Output this shift: No intake/output data recorded.  Lab Results: Recent Labs    04/08/21 0925 04/08/21 1452 04/08/21 2215 04/09/21 0620  WBC 7.0  --   --  10.2  HGB 7.8* 7.6* 8.3* 8.0*  HCT 25.4* 24.5* 26.2* 25.0*  PLT 346  --   --  295   BMET Recent Labs    04/08/21 0925  NA 135  K 3.9  CL 99  CO2 28  GLUCOSE 263*  BUN <5*  CREATININE 0.57  CALCIUM 8.7*   LFT Recent Labs    04/08/21 0925  PROT 6.7  ALBUMIN 3.5  AST 16  ALT 18  ALKPHOS 69  BILITOT 0.5   PT/INR Recent Labs    04/08/21 0925 04/09/21 0620  LABPROT 26.8* 16.5*  INR 2.6* 1.4*    Studies/Results: DG Chest Portable 1 View  Result Date:  04/08/2021 CLINICAL DATA:  Cough and shortness of breath EXAM: PORTABLE CHEST 1 VIEW COMPARISON:  None. FINDINGS: Cardiac shadows within normal limits. Lungs are well aerated bilaterally. No focal infiltrate or sizable effusion is seen. Loop recorder and postsurgical changes noted. IMPRESSION: No acute abnormality noted. Electronically Signed   By: Inez Catalina M.D.   On: 04/08/2021 09:41       Assessment / Plan:    #82 75 year old African-American female with symptomatic anemia presenting with 2-week history of shortness of breath, and also reporting melenic stools at home within the past 2 weeks. Patient has history of iron deficiency and was found to be iron deficient on admission, status post Feraheme x1  Hemoglobin 7.8 on admission, patient received 1 unit of packed RBCs yesterday and hemoglobin stable today at 8 INR had been elevated on admission secondary to Eliquis, normalized today  Patient has history of recurrent GI bleeding by her report says this has happened on several occasions in the past and she has had complete GI work-up in [place] with EGD colonoscopy and probably capsule endoscopy.  Reports the last work-up was about 4 years ago.  Etiology of her recent bleeding is not clear though suspect she may have recurrent AVMs with bleeding.  #2 history of atrial fibrillation-on Eliquis chronically 3 coronary artery disease status post CABG #4 COPD #5 insulin-dependent diabetes  mellitus  Plan; clear liquid diet, n.p.o. after midnight To new serial hemoglobins and transfuse to keep hemoglobin in the 8 range given symptomatic Continue to hold Eliquis  Have rerequested her records Patient will be scheduled for colonoscopy and EGD with Dr. Hilarie Fredrickson tomorrow 04/10/2021 at noon. Bowel prep this evening. Plans were discussed with the patient who is in agreement with plan.    Principal Problem:   COPD with acute exacerbation ([place]) Active Problems:   Symptomatic  anemia   Insulin dependent type 2 diabetes mellitus ([facility])   S/P CABG x 3   HTN (hypertension)   Heme positive stool   History of GI bleed     LOS: 1 day   Dashanti Burr PA-C 04/09/2021, 10:41 AM

## 2021-04-09 NOTE — Progress Notes (Signed)
Inpatient Diabetes Program Recommendations  AACE/ADA: New Consensus Statement on Inpatient Glycemic Control (2015)  Target Ranges:  Prepandial:   less than 140 mg/dL      Peak postprandial:   less than 180 mg/dL (1-2 hours)      Critically ill patients:  140 - 180 mg/dL   Lab Results  Component Value Date   GLUCAP 331 (H) 04/09/2021   HGBA1C 6.3 (H) 04/08/2021    Review of Glycemic Control Results for ALANIS, CLIFT (MRN [id number]) as of 04/09/2021 09:40  Ref. Range 04/08/2021 11:48 04/08/2021 18:11 04/09/2021 06:34  Glucose-Capillary Latest Ref Range: 70 - 99 mg/dL 273 (H) 460 (H) 331 (H)   Diabetes history: DM 2 Outpatient Diabetes medications: Tresiba 54 units, Humalog 15 units tid Current orders for Inpatient glycemic control:  Lantus 42 units qhs Novolog 0-20 units tid + hs  PO Prednisone 40 mg Daily  Inpatient Diabetes Program Recommendations:    -  Increase Lantus to 54 units, give additional units this am. -  Add Novolog 8 units tid meal coverage (1/2 home dose) if eating >50% of meals   Thanks,  Tama Headings RN, MSN, BC-ADM Inpatient Diabetes Coordinator Team Pager [phone] (8a-5p)

## 2021-04-09 NOTE — Progress Notes (Signed)
Patient's son reported that patient has had 2 episodes of diarrhea since 1500 today, RN will continue to monitor this patient.

## 2021-04-10 ENCOUNTER — Encounter (HOSPITAL_COMMUNITY)
Admission: EM | Disposition: A | Discharge status: Home / Self Care | Payer: Self-pay | Source: Home / Self Care | Attending: Internal Medicine

## 2021-04-10 ENCOUNTER — Inpatient Hospital Stay (HOSPITAL_COMMUNITY): Payer: 59 | Admitting: Anesthesiology

## 2021-04-10 ENCOUNTER — Encounter (HOSPITAL_COMMUNITY): Payer: Self-pay | Admitting: Internal Medicine

## 2021-04-10 DIAGNOSIS — K5520 Angiodysplasia of colon without hemorrhage: Secondary | ICD-10-CM

## 2021-04-10 DIAGNOSIS — D125 Benign neoplasm of sigmoid colon: Secondary | ICD-10-CM

## 2021-04-10 DIAGNOSIS — I509 Heart failure, unspecified: Secondary | ICD-10-CM

## 2021-04-10 DIAGNOSIS — D123 Benign neoplasm of transverse colon: Secondary | ICD-10-CM

## 2021-04-10 DIAGNOSIS — I110 Hypertensive heart disease with heart failure: Secondary | ICD-10-CM | POA: Diagnosis not present

## 2021-04-10 DIAGNOSIS — D122 Benign neoplasm of ascending colon: Secondary | ICD-10-CM | POA: Diagnosis not present

## 2021-04-10 DIAGNOSIS — I5089 Other heart failure: Secondary | ICD-10-CM

## 2021-04-10 DIAGNOSIS — K31811 Angiodysplasia of stomach and duodenum with bleeding: Secondary | ICD-10-CM

## 2021-04-10 DIAGNOSIS — D500 Iron deficiency anemia secondary to blood loss (chronic): Secondary | ICD-10-CM | POA: Diagnosis not present

## 2021-04-10 DIAGNOSIS — K921 Melena: Secondary | ICD-10-CM | POA: Diagnosis not present

## 2021-04-10 DIAGNOSIS — B3781 Candidal esophagitis: Secondary | ICD-10-CM

## 2021-04-10 DIAGNOSIS — J441 Chronic obstructive pulmonary disease with (acute) exacerbation: Secondary | ICD-10-CM | POA: Diagnosis not present

## 2021-04-10 HISTORY — PX: HEMOSTASIS CONTROL: SHX6838

## 2021-04-10 HISTORY — PX: ESOPHAGOGASTRODUODENOSCOPY (EGD) WITH PROPOFOL: SHX5813

## 2021-04-10 HISTORY — PX: COLONOSCOPY WITH PROPOFOL: SHX5780

## 2021-04-10 HISTORY — PX: POLYPECTOMY: SHX5525

## 2021-04-10 HISTORY — PX: HOT HEMOSTASIS: SHX5433

## 2021-04-10 LAB — PROTIME-INR
INR: 1.1 (ref 0.8–1.2)
Prothrombin Time: 14.2 seconds (ref 11.4–15.2)

## 2021-04-10 LAB — CBC
HCT: 27 % — ABNORMAL LOW (ref 36.0–46.0)
Hemoglobin: 8.4 g/dL — ABNORMAL LOW (ref 12.0–15.0)
MCH: 25.1 pg — ABNORMAL LOW (ref 26.0–34.0)
MCHC: 31.1 g/dL (ref 30.0–36.0)
MCV: 80.6 fL (ref 80.0–100.0)
Platelets: 291 10*3/uL (ref 150–400)
RBC: 3.35 MIL/uL — ABNORMAL LOW (ref 3.87–5.11)
RDW: 15.9 % — ABNORMAL HIGH (ref 11.5–15.5)
WBC: 11.5 10*3/uL — ABNORMAL HIGH (ref 4.0–10.5)
nRBC: 0.4 % — ABNORMAL HIGH (ref 0.0–0.2)

## 2021-04-10 LAB — GLUCOSE, CAPILLARY
Glucose-Capillary: 59 mg/dL — ABNORMAL LOW (ref 70–99)
Glucose-Capillary: 125 mg/dL — ABNORMAL HIGH (ref 70–99)
Glucose-Capillary: 88 mg/dL (ref 70–99)
Glucose-Capillary: 193 mg/dL — ABNORMAL HIGH (ref 70–99)

## 2021-04-10 LAB — BASIC METABOLIC PANEL
Anion gap: 8 (ref 5–15)
BUN: 11 mg/dL (ref 8–23)
CO2: 29 mmol/L (ref 22–32)
Calcium: 9.1 mg/dL (ref 8.9–10.3)
Chloride: 103 mmol/L (ref 98–111)
Creatinine, Ser: 0.62 mg/dL (ref 0.44–1.00)
GFR, Estimated: >60 mL/min (ref >60)
Glucose, Bld: 70 mg/dL (ref 70–99)
Potassium: 3.4 mmol/L — ABNORMAL LOW (ref 3.5–5.1)
Sodium: 140 mmol/L (ref 135–145)

## 2021-04-10 SURGERY — COLONOSCOPY WITH PROPOFOL
Anesthesia: Monitor Anesthesia Care

## 2021-04-10 MED ORDER — IPRATROPIUM-ALBUTEROL 0.5-2.5 (3) MG/3ML IN SOLN
3.0000 mL | Freq: Two times a day (BID) | RESPIRATORY_TRACT | Status: DC
  Administered 2021-04-10 – 2021-04-11 (×2): 3 mL via RESPIRATORY_TRACT
  Filled 2021-04-10 (×2): qty 3

## 2021-04-10 MED ORDER — GLUCAGON HCL RDNA (DIAGNOSTIC) 1 MG IJ SOLR
INTRAMUSCULAR | Status: AC
  Filled 2021-04-10: qty 1

## 2021-04-10 MED ORDER — SODIUM CHLORIDE (PF) 0.9 % IJ SOLN
PREFILLED_SYRINGE | INTRAMUSCULAR | Status: DC | PRN
  Administered 2021-04-10: 2 mL

## 2021-04-10 MED ORDER — LOSARTAN POTASSIUM 50 MG PO TABS
100.0000 mg | ORAL_TABLET | Freq: Every day | ORAL | Status: DC
  Administered 2021-04-10 – 2021-04-11 (×2): 100 mg via ORAL
  Filled 2021-04-10 (×2): qty 2

## 2021-04-10 MED ORDER — LIDOCAINE 2% (20 MG/ML) 5 ML SYRINGE
INTRAMUSCULAR | Status: DC | PRN
  Administered 2021-04-10: 30 mg via INTRAVENOUS

## 2021-04-10 MED ORDER — FLUCONAZOLE 100 MG PO TABS
400.0000 mg | ORAL_TABLET | Freq: Once | ORAL | Status: AC
  Administered 2021-04-10: 400 mg via ORAL
  Filled 2021-04-10: qty 4

## 2021-04-10 MED ORDER — PANTOPRAZOLE SODIUM 40 MG PO TBEC
40.0000 mg | DELAYED_RELEASE_TABLET | Freq: Every day | ORAL | Status: DC
  Administered 2021-04-11: 40 mg via ORAL
  Filled 2021-04-10 (×2): qty 1

## 2021-04-10 MED ORDER — GLUCAGON HCL RDNA (DIAGNOSTIC) 1 MG IJ SOLR
INTRAMUSCULAR | Status: DC | PRN
  Administered 2021-04-10: .5 mg via INTRAVENOUS

## 2021-04-10 MED ORDER — EPINEPHRINE 1 MG/10ML IJ SOSY
PREFILLED_SYRINGE | INTRAMUSCULAR | Status: AC
  Filled 2021-04-10: qty 10

## 2021-04-10 MED ORDER — FLUCONAZOLE 100 MG PO TABS
200.0000 mg | ORAL_TABLET | Freq: Every day | ORAL | Status: DC
  Administered 2021-04-11: 200 mg via ORAL
  Filled 2021-04-10: qty 2

## 2021-04-10 MED ORDER — LACTATED RINGERS IV SOLN: INTRAVENOUS | Status: DC | PRN

## 2021-04-10 MED ORDER — INSULIN GLARGINE 100 UNIT/ML ~~LOC~~ SOLN
48.0000 [IU] | Freq: Every day | SUBCUTANEOUS | Status: DC
  Administered 2021-04-10: 48 [IU] via SUBCUTANEOUS
  Filled 2021-04-10 (×2): qty 0.48

## 2021-04-10 MED ORDER — PROPOFOL 500 MG/50ML IV EMUL
INTRAVENOUS | Status: DC | PRN
  Administered 2021-04-10: 100 ug/kg/min via INTRAVENOUS

## 2021-04-10 MED ORDER — DEXTROSE 50 % IV SOLN
1.0000 | Freq: Once | INTRAVENOUS | Status: AC
  Administered 2021-04-10: 50 mL via INTRAVENOUS
  Filled 2021-04-10: qty 50

## 2021-04-10 MED ORDER — FUROSEMIDE 10 MG/ML IJ SOLN
60.0000 mg | Freq: Every day | INTRAMUSCULAR | Status: DC
  Administered 2021-04-10 – 2021-04-11 (×2): 60 mg via INTRAVENOUS
  Filled 2021-04-10 (×2): qty 6

## 2021-04-10 SURGICAL SUPPLY — 25 items

## 2021-04-10 NOTE — Anesthesia Procedure Notes (Signed)
Procedure Name: MAC Date/Time: 04/10/2021 12:45 PM Performed by: Barrington Ellison, CRNA Pre-anesthesia Checklist: Patient identified, Emergency Drugs available, Suction available, Patient being monitored and Timeout performed Patient Re-evaluated:Patient Re-evaluated prior to induction Oxygen Delivery Method: Nasal cannula

## 2021-04-10 NOTE — Transfer of Care (Signed)
Immediate Anesthesia Transfer of Care Note  Patient: Caitlyn Lawrence  Procedure(s) Performed: COLONOSCOPY WITH PROPOFOL (N/A ) ESOPHAGOGASTRODUODENOSCOPY (EGD) WITH PROPOFOL (N/A ) HOT HEMOSTASIS (ARGON PLASMA COAGULATION/BICAP) (N/A ) HEMOSTASIS CONTROL POLYPECTOMY  Patient Location: Endoscopy Unit  Anesthesia Type:MAC  Level of Consciousness: drowsy and patient cooperative  Airway & Oxygen Therapy: Patient Spontanous Breathing  Post-op Assessment: Report given to RN  Post vital signs: Reviewed and stable  Last Vitals:  Vitals Value Taken Time  BP 143/57 04/10/21 1356  Temp 36.6 C 04/10/21 1356  Pulse 117 04/10/21 1358  Resp 17 04/10/21 1358  SpO2 97 % 04/10/21 1358  Vitals shown include unvalidated device data.  Last Pain:  Vitals:   04/10/21 1356  TempSrc: Axillary  PainSc: 0-No pain      Patients Stated Pain Goal: 0 (41/59/73 3125)  Complications: No complications documented.

## 2021-04-10 NOTE — Progress Notes (Signed)
Subjective:   Overnight, patient had blood glucose of 59 and received ampule of D50.  This morning, patient reports that her breathing is much improved. She states that she has been using the restroom very often and has developed a headache. She is looking forward to getting her colonoscopy and endoscopy today.  During examination, discussed with patient her lower extremity swelling. She states that she has a history of heart failure, however she cannot remember her last echocardiogram. She reports that she was following with a cardiologist at [facility]. Patient reports that she does not take a diuretic. She has only been living in [place] for approximately a week.  Objective:  Vital signs in last 24 hours: Vitals:   04/10/21 0855 04/10/21 0857 04/10/21 0914 04/10/21 1141  BP:   (!) 153/65 (!) 171/88  Pulse:   88 91  Resp:   20 16  Temp:   98.1 F (36.7 C) 98.9 F (37.2 C)  TempSrc:   Oral Tympanic  SpO2: 96% 96% 94% 97%  Weight:      Height:      On room air  Intake/Output Summary (Last 24 hours) at 04/10/2021 1259 Last data filed at 04/10/2021 0800 Gross per 24 hour  Intake 3557 ml  Output --  Net 3557 ml   Filed Weights   04/08/21 0934 04/08/21 1407  Weight: 106.1 kg 110 kg   Physical Exam Constitutional:      General: She is not in acute distress.    Appearance: She is obese.  Cardiovascular:     Rate and Rhythm: Normal rate and regular rhythm.  Pulmonary:     Breath sounds: Decreased breath sounds present. No wheezing or rales.  Musculoskeletal:     Right lower leg: Edema present.     Left lower leg: Edema present.     Comments: 1+ pitting edema of bilateral lower extremities  Neurological:     General: No focal deficit present.     Mental Status: She is alert and oriented to person, place, and time.  Psychiatric:        Mood and Affect: Mood normal.        Behavior: Behavior normal.   Labs in last 24 hours:  CBC Latest Ref Rng & Units 04/10/2021  04/09/2021 04/08/2021  WBC 4.0 - 10.5 K/uL 11.5(H) 10.2 -  Hemoglobin 12.0 - 15.0 g/dL 8.4(L) 8.0(L) 8.3(L)  Hematocrit 36.0 - 46.0 % 27.0(L) 25.0(L) 26.2(L)  Platelets 150 - 400 K/uL 291 295 -   BMP Latest Ref Rng & Units 04/10/2021 04/08/2021  Glucose 70 - 99 mg/dL 70 263(H)  BUN 8 - 23 mg/dL 11 <5(L)  Creatinine 0.44 - 1.00 mg/dL 0.62 0.57  Sodium 135 - 145 mmol/L 140 135  Potassium 3.5 - 5.1 mmol/L 3.4(L) 3.9  Chloride 98 - 111 mmol/L 103 99  CO2 22 - 32 mmol/L 29 28  Calcium 8.9 - 10.3 mg/dL 9.1 8.7(L)   Glucose - 125, 59, 188, 177, 204 INR - 1.1 from 1.4 from 2.6  Imaging following admission: No further imaging  Assessment/Plan:  Principal Problem:   Iron deficiency anemia Active Problems:   COPD with acute exacerbation (HCC)   Symptomatic anemia   Insulin dependent type 2 diabetes mellitus (HCC)   S/P CABG x 3   HTN (hypertension)   Heme positive stool   History of GI bleed  Caitlyn Lawrence is a 75 year old woman with past medical history significant for paroxysmal atrial fibrillation on Eliquis, COPD,  insulin-dependent type II DM, chronic back pain, CAD s/p triple bypass, and HTN who presented to [facility] on 04/08/21 with progressive dyspnea found to have symptomatic iron deficiency anemia.  #Symptomatic iron deficiency anemia, active Patient's hemoglobin continues to trend up, most recently 8.4. Patient is scheduled for colonoscopy and endoscopy today. With holding of her home rivaroxaban, her INR is 1.1. -Continue holding rivaroxaban -NPO -Colonoscopy and endoscopy today -Continue IV pantoprazole 40mg  twice daily -Daily CBC, transfuse to maintain hemoglobin >8 -Daily INR  #Chronic heart failure Patient endorses history of chronic heart failure for which she followed closely with cardiology. She is not on a diuretic for this condition and does have pitting edema on examination although her lungs are clear to auscultation bilaterally. -Consider obtaining  echocardiogram following GI workup -Obtain records from outside cardiologist -Strict intake/output -Daily weights  #COPD exacerbation, active Patient continues to not have wheezing on examination. She is breathing well on room air. -Continue azithromycin for five day course (day 3/5) -Continue scheduled Duonebs three times daily -Continue albuterol nebulizer every two hours PRN -Continue Breo Ellipta 1 puff daily  #Insulin-dependent type II DM, chronic Patient had significant decline of blood glucose from >400 to 59 overnight in the setting of escalation of insulin regimen, NPO after midnight and stopping prednisone. -Decrease lantus from 54 to 48 units daily -Continue novolog 8 units three times daily with meals -Resistant SSI three times daily with meals and 0-5 units daily at bedtime  #HTN, chronic #CAD status post triple bypass, chronic Patient's blood pressure is progressively trending up in the setting of holding home antihypertensive regimen. -Continue home metoprolol 100mg  daily -Restart home losartan 100mg  daily -Continue holding home amlodipine 2.5mg  daily  #Paroxysmal atrial fibrillation, chronic Patient's heart rate well controlled on home regimen -Continue home metoprolol 100mg  daily  #VTE ppx: SCDs #Diet: NPO #Code status: DNR  Prior to Admission Living Arrangement: Home Anticipated Discharge Location: Home Barriers to Discharge: Continued medical workup Dispo: Anticipated discharge in approximately 2-3 day(s).   Cato Mulligan, MD 04/10/2021, 12:59 PM Pager: [phone] After 5pm on weekdays and 1pm on weekends: On Call pager [phone]

## 2021-04-10 NOTE — Anesthesia Preprocedure Evaluation (Addendum)
Anesthesia Evaluation  Patient identified by MRN, date of birth, ID band Patient awake    Reviewed: Allergy & Precautions, NPO status , Patient's Chart, lab work & pertinent test results  Airway Mallampati: I  TM Distance: >3 FB Neck ROM: Full    Dental  (+) Edentulous Upper, Edentulous Lower   Pulmonary COPD,  COPD inhaler, Current Smoker,    breath sounds clear to auscultation       Cardiovascular hypertension, Pt. on medications and Pt. on home beta blockers  Rhythm:Regular Rate:Normal     Neuro/Psych negative neurological ROS  negative psych ROS   GI/Hepatic negative GI ROS, Neg liver ROS,   Endo/Other  diabetes, Type 2, Insulin Dependent  Renal/GU negative Renal ROS  negative genitourinary   Musculoskeletal negative musculoskeletal ROS (+)   Abdominal Normal abdominal exam  (+)   Peds  Hematology   Anesthesia Other Findings   Reproductive/Obstetrics                            Anesthesia Physical Anesthesia Plan  ASA: III  Anesthesia Plan: MAC   Post-op Pain Management:    Induction: Intravenous  PONV Risk Score and Plan: 0 and Propofol infusion  Airway Management Planned: Natural Airway and Nasal Cannula  Additional Equipment: None  Intra-op Plan:   Post-operative Plan:   Informed Consent: I have reviewed the patients History and Physical, chart, labs and discussed the procedure including the risks, benefits and alternatives for the proposed anesthesia with the patient or authorized representative who has indicated his/her understanding and acceptance.       Plan Discussed with: CRNA  Anesthesia Plan Comments:        Anesthesia Quick Evaluation

## 2021-04-10 NOTE — Progress Notes (Signed)
Hypoglycemic Event  CBG:59 @ 0635  Treatment: D50 50 mL (25 gm)  Symptoms: None  Ff-up CBG: @07 ;30  Possible Reasons for Event: pt. NPO  Comments/MD notified:Yes    Babs Sciara

## 2021-04-10 NOTE — Op Note (Signed)
[facility] Patient Name: Caitlyn Lawrence Procedure Date : 04/10/2021 MRN: [id number] Attending MD: Jerene Bears , MD Date of Birth: October 14, 1946 CSN: [id number] Age: 75 Admit Type: Inpatient Procedure:                Colonoscopy Indications:              Melena, Acute post hemorrhagic anemia, history of                            previous GI bleeding (presumably from angioectasias                            per patient report) Providers:                Lajuan Lines. Hilarie Fredrickson, MD, Josie Dixon, RN, Laverda Sorenson, Technician Referring MD:             [facility] Medicines:                Monitored Anesthesia Care Complications:            No immediate complications. Estimated Blood Loss:     Estimated blood loss was minimal. Procedure:                Pre-Anesthesia Assessment:                           - Prior to the procedure, a History and Physical                            was performed, and patient medications and                            allergies were reviewed. The patient's tolerance of                            previous anesthesia was also reviewed. The risks                            and benefits of the procedure and the sedation                            options and risks were discussed with the patient.                            All questions were answered, and informed consent                            was obtained. Prior Anticoagulants: The patient has                            taken Xarelto (rivaroxaban), last dose was 2 days  prior to procedure. ASA Grade Assessment: III - A                            patient with severe systemic disease. After                            reviewing the risks and benefits, the patient was                            deemed in satisfactory condition to undergo the                            procedure.                           After obtaining informed consent,  the colonoscope                            was passed under direct vision. Throughout the                            procedure, the patient's blood pressure, pulse, and                            oxygen saturations were monitored continuously. The                            PCF-H190DL ([id number] Olympus pediatric colonoscope                            was introduced through the anus and advanced to the                            cecum, identified by appendiceal orifice and                            ileocecal valve. The colonoscopy was performed                            without difficulty. The patient tolerated the                            procedure well. The quality of the bowel                            preparation was good. The ileocecal valve,                            appendiceal orifice, and rectum were photographed. Scope In: 1:21:40 PM Scope Out: 1:48:25 PM Scope Withdrawal Time: 0 hours 23 minutes 26 seconds  Total Procedure Duration: 0 hours 26 minutes 45 seconds  Findings:      The digital rectal exam was normal.      Four angioectasias with typical arborization were found in the cecum.  Area under each was successfully injected with a total of 1.5 mL of a       diluted 1:100,000 solution of epinephrine for improved access and       hemostatic effect (by lifting the lesion prior to destruction).       Fulguration to ablate the lesions to prevent bleeding by argon plasma at       0.5 liters/minute and 20 watts was successful.      A single angioectasia with typical arborization was found in the       ascending colon. Area under the lesion was successfully injected with       0.5 mL of diluted a 1:100,000 solution of epinephrine for improved       access and hemostatic effect (by lifting the lesion prior to       destruction). Fulguration to ablate the lesion to prevent bleeding by       argon plasma at 0.5 liters/minute and 20 watts was successful.      A 8 mm polyp  was found in the ascending colon. The polyp was sessile.       The polyp was removed with a cold snare. Resection and retrieval were       complete.      A 4 mm polyp was found in the transverse colon. The polyp was sessile.       The polyp was removed with a cold snare. Resection and retrieval were       complete.      A 5 mm polyp was found in the sigmoid colon. The polyp was sessile. The       polyp was removed with a cold snare. Resection and retrieval were       complete.      A few small-mouthed diverticula were found in the sigmoid colon and       distal descending colon.      The retroflexed view of the distal rectum and anal verge was normal and       showed no anal or rectal abnormalities. Impression:               - Four colonic angioectasias. Injected and then                            ablated with argon plasma coagulation (APC).                           - A single colonic angioectasia. Injected and then                            ablated with argon plasma coagulation (APC).                           - One 8 mm polyp in the ascending colon, removed                            with a cold snare. Resected and retrieved.                           - One 4 mm polyp in the transverse colon, removed  with a cold snare. Resected and retrieved.                           - One 5 mm polyp in the sigmoid colon, removed with                            a cold snare. Resected and retrieved.                           - Diverticulosis in the sigmoid colon and in the                            distal descending colon.                           - The distal rectum and anal verge are normal on                            retroflexion view. Moderate Sedation:      N/A Recommendation:           - Return patient to hospital ward for ongoing care.                           - Resume previous diet.                           - Continue present medications.                            - Resume Xarelto (rivaroxaban) at prior dose in 2                            days. Refer to managing physician for further                            adjustment of therapy.                           - Await pathology results.                           - Will need close attention to Hgb and iron                            studies. IV iron in the future if needed. IF                            recurrent will need to consider deep enteroscopy                            given high probability of lesions in the most                            distal small bowel.                           -  Office follow-up with [facility] to be arranged                            after discharge.                           - No recommendation at this time regarding repeat                            colonoscopy. Procedure Code(s):        --- Professional ---                           [phone], 79, Colonoscopy, flexible; with control of                            bleeding, any method                           45385, Colonoscopy, flexible; with removal of                            tumor(s), polyp(s), or other lesion(s) by snare                            technique                           45381, 42, Colonoscopy, flexible; with directed                            submucosal injection(s), any substance Diagnosis Code(s):        --- Professional ---                           K55.20, Angiodysplasia of colon without hemorrhage                           K63.5, Polyp of colon                           K92.1, Melena (includes Hematochezia)                           D62, Acute posthemorrhagic anemia                           K57.30, Diverticulosis of large intestine without                            perforation or abscess without bleeding CPT copyright 2019 American Medical Association. All rights reserved. The codes documented in this report are preliminary and upon coder review may  be revised to meet current  compliance requirements. Jerene Bears, MD 04/10/2021 2:39:44 PM This report has been signed electronically. Number of Addenda: 0

## 2021-04-10 NOTE — Interval H&P Note (Signed)
History and Physical Interval Note: For EGD/colon to eval melena and acute on chronic anemia, hx of recurrent GI bleeding.  Xarelto has been on hold. Tolerated the prep The nature of the procedure, as well as the risks, benefits, and alternatives were carefully and thoroughly reviewed with the patient. Ample time for discussion and questions allowed. The patient understood, was satisfied, and agreed to proceed.   04/10/2021 12:19 PM  Caitlyn Lawrence  has presented today for surgery, with the diagnosis of GI bleeding/melena.  The various methods of treatment have been discussed with the patient and family. After consideration of risks, benefits and other options for treatment, the patient has consented to  Procedure(s): COLONOSCOPY WITH PROPOFOL (N/A) ESOPHAGOGASTRODUODENOSCOPY (EGD) WITH PROPOFOL (N/A) as a surgical intervention.  The patient's history has been reviewed, patient examined, no change in status, stable for surgery.  I have reviewed the patient's chart and labs.  Questions were answered to the patient's satisfaction.     [name]

## 2021-04-10 NOTE — Op Note (Addendum)
[facility] Patient Name: Caitlyn Lawrence Procedure Date : 04/10/2021 MRN: [id number] Attending MD: Jerene Bears , MD Date of Birth: 1946-12-28 CSN: [id number] Age: 76 Admit Type: Inpatient Procedure:                Upper GI endoscopy Indications:              Acute post hemorrhagic anemia, Melena, history of                            recurrent GI bleeding (from presumed angioectasias                            per her history) Providers:                Lajuan Lines. Hilarie Fredrickson, MD, Josie Dixon, RN, Laverda Sorenson, Technician, Sampson Si, CRNA Referring MD:             [facility] Medicines:                Monitored Anesthesia Care Complications:            No immediate complications. Estimated Blood Loss:     Estimated blood loss was minimal. Procedure:                Pre-Anesthesia Assessment:                           - Prior to the procedure, a History and Physical                            was performed, and patient medications and                            allergies were reviewed. The patient's tolerance of                            previous anesthesia was also reviewed. The risks                            and benefits of the procedure and the sedation                            options and risks were discussed with the patient.                            All questions were answered, and informed consent                            was obtained. Prior Anticoagulants: The patient has                            taken Xarelto (rivaroxaban), last dose was 2 days  prior to procedure. ASA Grade Assessment: III - A                            patient with severe systemic disease. After                            reviewing the risks and benefits, the patient was                            deemed in satisfactory condition to undergo the                            procedure.                           After  obtaining informed consent, the endoscope was                            passed under direct vision. Throughout the                            procedure, the patient's blood pressure, pulse, and                            oxygen saturations were monitored continuously. The                            GIF-H190 ([id number]) Olympus gastroscope was                            introduced through the mouth, and advanced to the                            third part of duodenum. Once angioectasias were                            found the gastroscope was exchanged for a pediatric                            colonoscopy to more deeply exam the duodenum. The                            upper GI endoscopy was accomplished without                            difficulty. The patient tolerated the procedure                            well. Scope In: Scope Out: Findings:      Patchy, white plaques were found in the entire esophagus. This is       consistent with Candida infection.      The entire examined stomach was normal.      Nine 1 to 5 mm angioectasias with bleeding on contact were found in the  duodenal bulb (1), in the second portion of the duodenum (5) and in the       third portion (3) of the duodenum. Fulguration to stop the bleeding and       to prevent bleeding by argon plasma at 0.5 liters/minute and 20 watts       was successful. Impression:               - Esophageal plaques were found, consistent with                            candidiasis.                           - Normal stomach.                           - Nine angioectasias in the duodenum. Treated with                            argon plasma coagulation (APC).                           - No specimens collected. Moderate Sedation:      N/A Recommendation:           - Patient has a contact number available for                            emergencies. The signs and symptoms of potential                            delayed  complications were discussed with the                            patient. Return to normal activities tomorrow.                            Written discharge instructions were provided to the                            patient.                           - Resume previous diet.                           - Continue present medications. Daily PPI.                           - Closely monitor Hgb and iron studies. Replace                            iron IV when/if needed.                           - See the other procedure note for documentation of  additional recommendations. Procedure Code(s):        --- Professional ---                           [phone], Esophagogastroduodenoscopy, flexible,                            transoral; with control of bleeding, any method Diagnosis Code(s):        --- Professional ---                           W25.749, Angiodysplasia of stomach and duodenum                            without bleeding                           D62, Acute posthemorrhagic anemia                           K92.1, Melena (includes Hematochezia) CPT copyright 2019 American Medical Association. All rights reserved. The codes documented in this report are preliminary and upon coder review may  be revised to meet current compliance requirements. Jerene Bears, MD 04/10/2021 2:19:57 PM This report has been signed electronically. Number of Addenda: 0

## 2021-04-10 NOTE — Plan of Care (Signed)

## 2021-04-11 ENCOUNTER — Encounter (HOSPITAL_COMMUNITY): Payer: Self-pay | Admitting: Internal Medicine

## 2021-04-11 ENCOUNTER — Telehealth: Payer: Self-pay | Admitting: Pharmacy Technician

## 2021-04-11 ENCOUNTER — Other Ambulatory Visit: Payer: Self-pay | Admitting: Internal Medicine

## 2021-04-11 DIAGNOSIS — I110 Hypertensive heart disease with heart failure: Secondary | ICD-10-CM | POA: Diagnosis not present

## 2021-04-11 DIAGNOSIS — D500 Iron deficiency anemia secondary to blood loss (chronic): Secondary | ICD-10-CM | POA: Diagnosis not present

## 2021-04-11 DIAGNOSIS — K31811 Angiodysplasia of stomach and duodenum with bleeding: Secondary | ICD-10-CM | POA: Diagnosis not present

## 2021-04-11 DIAGNOSIS — J441 Chronic obstructive pulmonary disease with (acute) exacerbation: Secondary | ICD-10-CM | POA: Diagnosis not present

## 2021-04-11 DIAGNOSIS — I5089 Other heart failure: Secondary | ICD-10-CM | POA: Diagnosis not present

## 2021-04-11 DIAGNOSIS — K5520 Angiodysplasia of colon without hemorrhage: Secondary | ICD-10-CM | POA: Diagnosis not present

## 2021-04-11 LAB — CBC
HCT: 27.7 % — ABNORMAL LOW (ref 36.0–46.0)
Hemoglobin: 8.7 g/dL — ABNORMAL LOW (ref 12.0–15.0)
MCH: 25.7 pg — ABNORMAL LOW (ref 26.0–34.0)
MCHC: 31.4 g/dL (ref 30.0–36.0)
MCV: 82 fL (ref 80.0–100.0)
Platelets: 302 10*3/uL (ref 150–400)
RBC: 3.38 MIL/uL — ABNORMAL LOW (ref 3.87–5.11)
RDW: 16.1 % — ABNORMAL HIGH (ref 11.5–15.5)
WBC: 10.2 10*3/uL (ref 4.0–10.5)
nRBC: 1 % — ABNORMAL HIGH (ref 0.0–0.2)

## 2021-04-11 LAB — BASIC METABOLIC PANEL
Anion gap: 8 (ref 5–15)
BUN: 7 mg/dL — ABNORMAL LOW (ref 8–23)
CO2: 31 mmol/L (ref 22–32)
Calcium: 8.7 mg/dL — ABNORMAL LOW (ref 8.9–10.3)
Chloride: 100 mmol/L (ref 98–111)
Creatinine, Ser: 0.65 mg/dL (ref 0.44–1.00)
GFR, Estimated: >60 mL/min (ref >60)
Glucose, Bld: 88 mg/dL (ref 70–99)
Potassium: 3.1 mmol/L — ABNORMAL LOW (ref 3.5–5.1)
Sodium: 139 mmol/L (ref 135–145)

## 2021-04-11 LAB — SURGICAL PATHOLOGY

## 2021-04-11 LAB — PROTIME-INR
INR: 1.1 (ref 0.8–1.2)
Prothrombin Time: 14.2 seconds (ref 11.4–15.2)

## 2021-04-11 LAB — GLUCOSE, CAPILLARY
Glucose-Capillary: 82 mg/dL (ref 70–99)
Glucose-Capillary: 164 mg/dL — ABNORMAL HIGH (ref 70–99)

## 2021-04-11 MED ORDER — INSULIN GLARGINE 100 UNIT/ML ~~LOC~~ SOLN
42.0000 [IU] | Freq: Every day | SUBCUTANEOUS | Status: DC
  Filled 2021-04-11: qty 0.42

## 2021-04-11 MED ORDER — UMECLIDINIUM-VILANTEROL 62.5-25 MCG/INH IN AEPB
1.0000 | INHALATION_SPRAY | Freq: Every day | RESPIRATORY_TRACT | Status: DC
  Filled 2021-04-11: qty 14

## 2021-04-11 MED ORDER — RIVAROXABAN 20 MG PO TABS: 20.0000 mg | ORAL_TABLET | Freq: Every morning | ORAL | 0 refills | Status: DC

## 2021-04-11 MED ORDER — FUROSEMIDE 40 MG PO TABS: 40.0000 mg | ORAL_TABLET | Freq: Every day | ORAL | Status: DC

## 2021-04-11 MED ORDER — POTASSIUM CHLORIDE CRYS ER 20 MEQ PO TBCR
40.0000 meq | EXTENDED_RELEASE_TABLET | Freq: Four times a day (QID) | ORAL | Status: AC
  Administered 2021-04-11 (×2): 40 meq via ORAL
  Filled 2021-04-11 (×2): qty 2

## 2021-04-11 MED ORDER — FLUCONAZOLE 200 MG PO TABS: 200.0000 mg | ORAL_TABLET | Freq: Every day | ORAL | 0 refills | Status: DC

## 2021-04-11 MED ORDER — UMECLIDINIUM-VILANTEROL 62.5-25 MCG/INH IN AEPB: 1.0000 | INHALATION_SPRAY | Freq: Every day | RESPIRATORY_TRACT | 0 refills | Status: DC

## 2021-04-11 NOTE — Anesthesia Postprocedure Evaluation (Signed)
Anesthesia Post Note  Patient: Caitlyn Lawrence  Procedure(s) Performed: COLONOSCOPY WITH PROPOFOL (N/A ) ESOPHAGOGASTRODUODENOSCOPY (EGD) WITH PROPOFOL (N/A ) HOT HEMOSTASIS (ARGON PLASMA COAGULATION/BICAP) (N/A ) HEMOSTASIS CONTROL POLYPECTOMY     Patient location during evaluation: PACU Anesthesia Type: MAC Level of consciousness: awake and alert Pain management: pain level controlled Vital Signs Assessment: post-procedure vital signs reviewed and stable Respiratory status: spontaneous breathing, nonlabored ventilation, respiratory function stable and patient connected to nasal cannula oxygen Cardiovascular status: stable and blood pressure returned to baseline Postop Assessment: no apparent nausea or vomiting Anesthetic complications: no   No complications documented.  Last Vitals:  Vitals:   04/11/21 0804 04/11/21 0906  BP:  132/72  Pulse:  84  Resp:  18  Temp:  36.8 C  SpO2: 96% 97%    Last Pain:  Vitals:   04/11/21 1332  TempSrc:   PainSc: [place] Dokken

## 2021-04-11 NOTE — Discharge Summary (Signed)
Name: Caitlyn Lawrence MRN: [id number] DOB: May 11, 1946 75 y.o. PCP: Pcp, No  Date of Admission: 04/08/2021  8:43 AM Date of Discharge: 04/11/2021 Attending Physician: Velna Ochs, MD  Discharge Diagnosis: 1. Principal Problem:   Iron deficiency anemia Active Problems:   COPD with acute exacerbation (HCC)   Symptomatic anemia   Insulin dependent type 2 diabetes mellitus (HCC)   S/P CABG x 3   HTN (hypertension)   Heme positive stool   History of GI bleed   Angiodysplasia of duodenum with hemorrhage   Benign neoplasm of ascending colon   Benign neoplasm of transverse colon   Benign neoplasm of sigmoid colon   Angiodysplasia of the colon   Acute on chronic heart failure (HCC)   Candidal esophagitis ([place])  Discharge Medications: Allergies as of 04/11/2021      Reactions   Penicillins Hives, Itching   Sulfa Antibiotics Other (See Comments)   Pt does not like how it makes her feel.       Medication List    STOP taking these medications   amLODipine 2.5 MG tablet Commonly known as: NORVASC   Breo Ellipta 100-25 MCG/INH Aepb Generic drug: fluticasone furoate-vilanterol   HYDROcodone-acetaminophen 10-325 MG tablet Commonly known as: NORCO   hydrOXYzine 25 MG capsule Commonly known as: VISTARIL   isosorbide mononitrate 60 MG 24 hr tablet Commonly known as: IMDUR   meclizine 12.5 MG tablet Commonly known as: ANTIVERT   nitroGLYCERIN 0.4 MG SL tablet Commonly known as: NITROSTAT   ondansetron 4 MG tablet Commonly known as: ZOFRAN     TAKE these medications   albuterol 0.63 MG/3ML nebulizer solution Commonly known as: ACCUNEB Take 1 ampule by nebulization every 6 (six) hours as needed for wheezing or shortness of breath.   ferrous sulfate 325 (65 FE) MG tablet Take 325 mg by mouth daily with breakfast.   fluconazole 200 MG tablet Commonly known as: DIFLUCAN Take 1 tablet (200 mg total) by mouth daily. Start taking on: April 12, 2021   gabapentin  100 MG capsule Commonly known as: NEURONTIN Take 200 mg by mouth 3 (three) times daily.   insulin lispro 100 UNIT/ML injection Commonly known as: HUMALOG Inject 15 Units into the skin 3 (three) times daily with meals.   ipratropium-albuterol 0.5-2.5 (3) MG/3ML Soln Commonly known as: DUONEB Take 3 mLs by nebulization 4 (four) times daily.   Combivent Respimat 20-100 MCG/ACT Aers respimat Generic drug: Ipratropium-Albuterol Inhale 1 puff into the lungs 4 (four) times daily.   losartan 100 MG tablet Commonly known as: COZAAR Take 100 mg by mouth daily.   lovastatin 20 MG tablet Commonly known as: MEVACOR Take 20 mg by mouth every evening.   metoprolol succinate 100 MG 24 hr tablet Commonly known as: TOPROL-XL Take 100 mg by mouth daily. Take with or immediately following a meal.   polyethylene glycol 17 g packet Commonly known as: MIRALAX / GLYCOLAX Take 17 g by mouth as needed for mild constipation.   rivaroxaban 20 MG Tabs tablet Commonly known as: XARELTO Take 1 tablet (20 mg total) by mouth every morning. Start taking on: April 14, 2021 What changed: These instructions start on April 14, 2021. If you are unsure what to do until then, ask your doctor or other care provider.   Tyler Aas FlexTouch 100 UNIT/ML FlexTouch Pen Generic drug: insulin degludec Inject 54 Units into the skin at bedtime.   umeclidinium-vilanterol 62.5-25 MCG/INH Aepb Commonly known as: ANORO ELLIPTA Inhale 1 puff into the lungs  daily. Start taking on: April 12, 2021   Vitamin D (Ergocalciferol) 1.25 MG (50000 UNIT) Caps capsule Commonly known as: DRISDOL Take 50,000 Units by mouth every 7 (seven) days. Mondays       Disposition and follow-up:   [name] was discharged from [facility] in Good condition.  At the hospital follow up visit please address:  1.  Symptomatic anemia 2/2 angioectasias: Patient presented with hemoglobin of 6.2 and found to have many  angioectasias on colonoscopy and endoscopy which were cauterized. Patient received PRBC transfusion, IV feraheme and oral iron supplementation while admitted. Please confirm patient's adherence to oral iron supplementation. Assess for further symptoms suggestive of GI bleed. Obtain CBC. Arrange for additional Feraheme transfusion.  Chronic heart failure: Patient has history of heart failure for which she previously followed with a cardiologist in [place], [place]. She was not previously prescribed a diuretic prior to arrival although noted to have lower extremity edema which we treated with two doses of IV lasix. Please assess patient's volume status and determine whether patient would benefit from initiation of diuretic. Consider ordering echocardiogram and placing referral to cardiologist within [facility] system to establish care. Obtain BMP.  Esophageal candidiasis: patient noted to have esophageal candidiasis on EGD. Please assess adherence to fluconazole. Patient's Breo inhaler discontinued to reduce oral corticosteroid exposure.  T2DM: Patient discharged on home insulin regimen. Assess blood glucose.  HTN: Patient discharged with losartan 100mg  daily and metoprolol 100mg  daily. Assess blood pressure.  Paroxysmal atrial fibrillation: patient remained in NSR. Plan to restart Xarelto on 04/14/21. Confirm patient has restarted this medication.  2.  Labs / imaging needed at time of follow-up: CBC, BMP  3.  Pending labs/ test needing follow-up: None  Follow-up Appointments:  Follow-up Information    [place]. Schedule an appointment as soon as possible for a visit in 1 week(s).   Contact information: [address] [place] Hospital Course by problem list:  #Symptomatic iron deficiency anemia 2/2 AVMs, active Patient presented with hemoglobin of approximately 6 with symptoms of dyspnea on exertion. Patient  additionally endorsed "dark and tarry stools" for the past few days. Review of records revealed patient has history of AVMs which she has had cauterized in the past. Patient transfused with PRBCs and iron studies obtained which revealed iron deficiency anemia. Patient received one dose of Feraheme. Gastroenterology was consulted who recommended two days of washout of her home Xarelto. Following washout period, patient underwent endoscopy and colonoscopy which revealed many angioectasias in the upper and lower GI tract which were cauterized. Patient continued on pantoprazole daily, restarted oral iron supplementation and planned to follow-up with gastroenterology in approximately four weeks. Patient planned to obtain repeat IV feraheme dose following discharge and plan to resume Xarelto on 04/17. Patient's hemoglobin at time of discharge was 8.7.  #Mild to moderate acute on chronic heart failure Patient noted to have development of 1+ pitting edema of the bilateral lower extremities. Patient received 60mg  of IV lasix on 04/13 and 04/14 with resolution of her lower extremity edema aside from some trace pitting edema to the bilateral mid shins. Patient previously followed with cardiology, however was not previously presribed a diuretic. Planned for patient to discharge without additional doses of furosemide and to re-evaluate volume status in clinic to determine need for diuretic therapy. Patient would benefit from referral to cardiology within [facility] as  she recently moved to the area and is not established with a new physician.  #Esophageal candidiasis, active During patient's EGD she was noted to have plaques consistent with esophageal candidiasis. Patient had negative HIV and etiology of finding attributed to diabetes, PPI and chronic Breo Ellipta. Patient's Breo Ellipta discontinued and transitioned to [employer] Patient discharged with plan to complete fourteen day course of fluconazole  daily.  #COPD exacerbation, active Patient initially felt to have COPD exacerbation in the setting of her shortness of breath and some wheezing on examination. Patient initially managed as COPD exacerbation with prednisone, scheduled Duonebs and azithromycin. Prednisone was discontinued the following day due to exacerbation of blood glucose readings to >400 with resolution of her wheezing. Patient discharged on home inhaler regimen with modification of Breo Ellipta to Danielle Rankin for reasons described above. Patient had no wheezing or shortness of breath on day of discharge.  #Insulin-dependent type II DM, chronic Patient's blood glucose rose significantly following admission in the setting of systemic corticosteroid administration. Patient's insulin regimen adjusted with rapid decline of her blood sugars overnight. Patient maintained on 48 units of lantus nightly, 8 units of novolog with meals and resistant sliding scale. On day of discharge, patient transitioned to home regimen which was controlling her blood glucose readings at home well prior to admission.  #HTN, chronic #CAD status post triple bypass, chronic Patient's blood pressure initiially well controlled and antihypertensive were held which led to a rise in blood pressure. Patient restarted on home metoprolol and losartan with holding of her home amlodipine with great control of her blood pressure.  Pertinent Labs, Studies, and Procedures:  CBC Latest Ref Rng & Units 04/11/2021 04/10/2021 04/09/2021  WBC 4.0 - 10.5 K/uL 10.2 11.5(H) 10.2  Hemoglobin 12.0 - 15.0 g/dL 8.7(L) 8.4(L) 8.0(L)  Hematocrit 36.0 - 46.0 % 27.7(L) 27.0(L) 25.0(L)  Platelets 150 - 400 K/uL 302 291 295   CMP Latest Ref Rng & Units 04/11/2021 04/10/2021 04/08/2021  Glucose 70 - 99 mg/dL 88 70 263(H)  BUN 8 - 23 mg/dL 7(L) 11 <5(L)  Creatinine 0.44 - 1.00 mg/dL 0.65 0.62 0.57  Sodium 135 - 145 mmol/L 139 140 135  Potassium 3.5 - 5.1 mmol/L 3.1(L) 3.4(L) 3.9   Chloride 98 - 111 mmol/L 100 103 99  CO2 22 - 32 mmol/L 31 29 28   Calcium 8.9 - 10.3 mg/dL 8.7(L) 9.1 8.7(L)  Total Protein 6.5 - 8.1 g/dL - - 6.7  Total Bilirubin 0.3 - 1.2 mg/dL - - 0.5  Alkaline Phos 38 - 126 U/L - - 69  AST 15 - 41 U/L - - 16  ALT 0 - 44 U/L - - 18  DG Chest Portable 1 View  Result Date: 04/08/2021 CLINICAL DATA:  Cough and shortness of breath EXAM: PORTABLE CHEST 1 VIEW COMPARISON:  None. FINDINGS: Cardiac shadows within normal limits. Lungs are well aerated bilaterally. No focal infiltrate or sizable effusion is seen. Loop recorder and postsurgical changes noted. IMPRESSION: No acute abnormality noted. Electronically Signed   By: Inez Catalina M.D.   On: 04/08/2021 09:41    Discharge Instructions: Discharge Instructions    Call MD for:  difficulty breathing, headache or visual disturbances   Complete by: As directed    Call MD for:  persistant dizziness or light-headedness   Complete by: As directed    Call MD for:  persistant nausea and vomiting   Complete by: As directed    Call MD for:  severe uncontrolled pain  Complete by: As directed    Call MD for:  temperature >100.4   Complete by: As directed    Diet - low sodium heart healthy   Complete by: As directed    Discharge instructions   Complete by: As directed    Ms. Susi,  It was an absolute pleasure having the opportunity to care for you during your recent hospitalization.  You were hospitalized for shortness of breath and found to have severe anemia caused by bleeding spots in your gastrointestinal tract. You will need to follow closely with gastroenterology in the outpatient setting for continued monitoring of this condition. Additionally, you were found to have significant fluid built up around your legs and we gave you medication to help reduce the swelling. Please follow-up with the [facility] following your discharge so that we can make sure that things are continued  to head in the right direction.  Sincerely, Dr. Paulla Dolly, MD   Increase activity slowly   Complete by: As directed      Signed: Cato Mulligan, MD 04/11/2021, 2:01 PM   Pager: [phone]

## 2021-04-11 NOTE — Progress Notes (Addendum)
Subjective:   Yesterday afternoon, patient underwent EGD which revealed esophageal candidiasis and 9 angietacsias in the duodenum all treated with APC with colonoscopy revealing colonic angiectasias treated with APC and diverticulosis in the sigmoid and descending colon.  Overnight, no acute events.  This morning, Ms. Lawrence states she is feeling okay with no acute complaints. She urinated four times overnight, which is not atypical for her, although she states it was a greater quantity than normal. She states that her lower extremity swelling has greatly improved. She denies difficulty breathing.  Objective:  Vital signs in last 24 hours: Vitals:   04/11/21 0619 04/11/21 0803 04/11/21 0804 04/11/21 0906  BP: 122/80   132/72  Pulse: 76   84  Resp: 18   18  Temp: 98.3 F (36.8 C)   98.3 F (36.8 C)  TempSrc: Oral   Oral  SpO2: 94% 98% 96% 97%  Weight: 108.6 kg     Height:      On room air  Intake/Output Summary (Last 24 hours) at 04/11/2021 1340 Last data filed at 04/11/2021 0900 Gross per 24 hour  Intake 1540 ml  Output 0 ml  Net 1540 ml   Filed Weights   04/08/21 0934 04/08/21 1407 04/11/21 0619  Weight: 106.1 kg 110 kg 108.6 kg   Physical Exam Constitutional:      General: She is not in acute distress.    Appearance: She is obese.  Cardiovascular:     Rate and Rhythm: Normal rate and regular rhythm.  Pulmonary:     Breath sounds: Decreased breath sounds present. No wheezing or rales.  Musculoskeletal:     Right lower leg: Edema present.     Left lower leg: Edema present.     Comments: Trace pitting edema of bilateral lower extremities up to mid shins  Neurological:     General: No focal deficit present.     Mental Status: She is alert and oriented to person, place, and time.  Psychiatric:        Mood and Affect: Mood normal.        Behavior: Behavior normal.   Labs in last 24 hours:  CBC Latest Ref Rng & Units 04/11/2021 04/10/2021 04/09/2021  WBC 4.0 -  10.5 K/uL 10.2 11.5(H) 10.2  Hemoglobin 12.0 - 15.0 g/dL 8.7(L) 8.4(L) 8.0(L)  Hematocrit 36.0 - 46.0 % 27.7(L) 27.0(L) 25.0(L)  Platelets 150 - 400 K/uL 302 291 295   BMP Latest Ref Rng & Units 04/11/2021 04/10/2021 04/08/2021  Glucose 70 - 99 mg/dL 88 70 263(H)  BUN 8 - 23 mg/dL 7(L) 11 <5(L)  Creatinine 0.44 - 1.00 mg/dL 0.65 0.62 0.57  Sodium 135 - 145 mmol/L 139 140 135  Potassium 3.5 - 5.1 mmol/L 3.1(L) 3.4(L) 3.9  Chloride 98 - 111 mmol/L 100 103 99  CO2 22 - 32 mmol/L 31 29 28   Calcium 8.9 - 10.3 mg/dL 8.7(L) 9.1 8.7(L)   INR - 1.1 Glucose - 164, 82, 193, 88, 125  Imaging following admission: No results found.  Assessment/Plan:  Principal Problem:   Iron deficiency anemia Active Problems:   COPD with acute exacerbation (HCC)   Symptomatic anemia   Insulin dependent type 2 diabetes mellitus (HCC)   S/P CABG x 3   HTN (hypertension)   Heme positive stool   History of GI bleed   Angiodysplasia of duodenum with hemorrhage   Benign neoplasm of ascending colon   Benign neoplasm of transverse colon   Benign neoplasm of  sigmoid colon   Angiodysplasia of the colon   Acute on chronic heart failure (HCC)   Candidal esophagitis (HCC)  Caitlyn Lawrence is a 75 year old woman with past medical history significant for paroxysmal atrial fibrillation (on Eliquis), COPD, insulin-dependent type II DM, CAD s/p triple bypass, and HTN who presented to [facility] on 04[facility]08[facility]21 with progressive dyspnea found to have symptomatic iron deficiency anemia secondary to angiodysplasias, COPD exacerbation and mild to moderate acute on chronic heart failure.  #Mild to moderate acute on chronic heart failure Patient received 60mg  IV lasix yesterday and this morning with no urine output recorded. Her weight is down 1.4kg since 04/11. On examination, patient's lower extremity edema is significantly improved. Patient would benefit from re-evaluation in [facility] in a few days for consideration of restarting diuretic  regimen. -Discontinue further diuresis, plan to re-evaluate in [facility] in a few days -Follow-up in [facility] in a few days  #Symptomatic iron deficiency anemia 2/2 AVMs, active Patient's hemoglobin continues to trend up, most recently 8.7. Source of patient's GI bleed identified on colonoscopy and endoscopy. Patient would benefit from continued follow-up with gastroenterology for monitoring and further evaluation with consideration of enteroscopy. -Restart Xarelto at prior dose on 04/17 -Pantoprazole 40mg  daily -Follow-up with gastroenterology in about four weeks -Restart oral iron supplementation -Consider IV feraheme in approximately one week  #Esophageal candidiasis, active -Fluconazole 400mg  for one dose, 200mg  daily for 14 days -Discontinue Breo Ellipta  #COPD exacerbation, active Patient continues to not have wheezing on examination. She is breathing well on room air. -Discontinue further doses of azithromycin -Continue scheduled Duonebs three times daily -Continue albuterol nebulizer every two hours PRN -Discontinue Breo Ellipta 1 puff daily -Start Anoro Ellipta 1 puff daily  #Insulin-dependent type II DM, chronic Patient's blood glucose continues to remain relatively low throughout admission. -Decrease lantus from 48 to 42 units daily -Continue novolog 8 units three times daily with meals -Resistant SSI three times daily with meals and 0-5 units daily at bedtime  #HTN, chronic #CAD status post triple bypass, chronic Patient's blood pressure is progressively trending up in the setting of holding home antihypertensive regimen. -Continue home metoprolol 100mg  daily -Continue home losartan 100mg  daily -Discontinue home amlodipine  #Paroxysmal atrial fibrillation, chronic Patient's heart rate well controlled on home regimen -Continue home metoprolol 100mg  daily  #VTE ppx: SCDs #Diet: Heart healthy/carb modified #Code status: DNR  Prior to Admission Living Arrangement:  Home Anticipated Discharge Location: Home Barriers to Discharge: Continued medical workup Dispo: Anticipated discharge in approximately 0-1 day(s).   Cato Mulligan, MD 04/11/2021, 1:40 PM Pager: [phone] After 5pm on weekdays and 1pm on weekends: On Call pager [phone]

## 2021-04-11 NOTE — Plan of Care (Signed)

## 2021-04-11 NOTE — Progress Notes (Signed)
DISCHARGE NOTE HOME Caitlyn Lawrence to be discharged Home per MD order. Discussed prescriptions and follow up appointments with the patient. Prescriptions given to patient; medication list explained in detail. Patient verbalized understanding.  Skin clean, dry and intact without evidence of skin break down, no evidence of skin tears noted. IV catheter discontinued intact. Site without signs and symptoms of complications. Dressing and pressure applied. Pt denies pain at the site currently. No complaints noted.  Patient free of lines, drains, and wounds.   An After Visit Summary (AVS) was printed and given to the patient. Patient escorted via wheelchair, and discharged home via private auto.  Paulla Fore, RN, BSN

## 2021-04-11 NOTE — Telephone Encounter (Signed)
Auth Submission: Payer: [employer] Dual Complete Medication & CPT/J Code(s) submitted: Feraheme (ferumoxytol) [id number] Route of submission (phone, fax, portal): Portal Units/visits requested: 750 mg IV x1 visit/infusion Reference number: [id number]  [place] Approved from 04/11/2021-04/11/2022

## 2021-04-11 NOTE — Plan of Care (Signed)

## 2021-04-11 NOTE — Progress Notes (Signed)
Patient ID: Caitlyn Lawrence, female   DOB: [birth year], 75 y.o.   MRN: [id number]    Progress Note   Subjective   day # 4  CC;GI bleed, melena, anemia  EGD-candidiasis, 9 AVM's in duodenum, treated with APC Colonoscopy-5 colonic AVMs treated with APC, 3 colon polyps, largest 8 mm all removed, also noted diverticulosis  : Path consistent with multiple fragments of tubular adenomas  INR 1.1 Hemoglobin 8.7/hematocrit 27.7 stable  Up in chair, eating without difficulty, says she feels okay has not had any stool since the procedures.  Had been noted to have some mild increased shortness of breath and increased peripheral edema and is being treated with IV Lasix per medicine team.  She denies shortness of breath currently No further right-sided chest/back discomfort   Objective   Vital signs in last 24 hours: Temp:  [97.8 F (36.6 C)-98.9 F (37.2 C)] 98.3 F (36.8 C) (04/14 0906) Pulse Rate:  [76-123] 84 (04/14 0906) Resp:  [16-18] 18 (04/14 0906) BP: (118-171)/(55-88) 132/72 (04/14 0906) SpO2:  [92 %-99 %] 97 % (04/14 0906) Weight:  [108.6 kg] 108.6 kg (04/14 0619) Last BM Date: 04/10/21 General:    Elderly African-American female in NAD Heart:  Regular rate and rhythm; no murmurs Lungs; decreased breath sounds bilaterally no wheezes Abdomen:  Soft, obese, nontender and nondistended. Normal bowel sounds. Extremities: 1+ edema bilateral lower extremities Neurologic:  Alert and oriented,  grossly normal neurologically. Psych:  Cooperative. Normal mood and affect.  Intake/Output from previous day: 04/13 0701 - 04/14 0700 In: 1300 [P.O.:900; I.V.:400] Out: 0  Intake/Output this shift: Total I/O In: 240 [P.O.:240] Out: 0   Lab Results: Recent Labs    04/09/21 0620 04/10/21 0645 04/11/21 0615  WBC 10.2 11.5* 10.2  HGB 8.0* 8.4* 8.7*  HCT 25.0* 27.0* 27.7*  PLT 295 291 302   BMET Recent Labs    04/10/21 0645 04/11/21 0615  NA 140 139  K 3.4* 3.1*  CL 103 100   CO2 29 31  GLUCOSE 70 88  BUN 11 7*  CREATININE 0.62 0.65  CALCIUM 9.1 8.7*   LFT No results for input(s): PROT, ALBUMIN, AST, ALT, ALKPHOS, BILITOT, BILIDIR, IBILI in the last 72 hours. PT/INR Recent Labs    04/10/21 0645 04/11/21 0615  LABPROT 14.2 14.2  INR 1.1 1.1        Assessment / Plan:    #33 75 year old African-American female admitted with symptomatic anemia, hemoglobin 7.8 with associated shortness of breath.  She had also reported melenic stools within the 2 weeks prior to admission. In background of that she has history of iron deficiency anemia and prior recurrent GI bleeding with previous work-up in [place].  By patient's report it sounded if she had had prior AVMs with last work-up about 4 years ago.  She has been stable since admission Eliquis on hold  EGD and colonoscopy yesterday revealed multiple AVMs as outlined above both in the duodenum and colon and all were treated with APC She also had polyps removed from the colon with path confirming adenomatous polyps.  No active bleeding  #2 iron deficiency-status post Feraheme x1 this admit, would benefit from second dose within a week #3 COPD-with exacerbation  #4 coronary artery disease status post prior CABG #5 insulin-dependent diabetes mellitus #6 hypertension  Plan; continue oral iron supplementation Consider IV Feraheme 1 week after initial dose Can plan to resume Eliquis on Sunday, 04/14/2021 She will need routine following of hemoglobin, every couple  of weeks initially We will plan to see her in follow-up in the office in about 4 weeks.  If she has further evidence of drifting hemoglobin or melena can do capsule endoscopy as an outpatient.  GI will sign off, call for problems     Principal Problem:   Iron deficiency anemia Active Problems:   COPD with acute exacerbation (HCC)   Symptomatic anemia   Insulin dependent type 2 diabetes mellitus (HCC)   S/P CABG x 3   HTN  (hypertension)   Heme positive stool   History of GI bleed   Angiodysplasia of duodenum with hemorrhage   Benign neoplasm of ascending colon   Benign neoplasm of transverse colon   Benign neoplasm of sigmoid colon   Angiodysplasia of the colon   Acute on chronic heart failure (HCC)   Candidal esophagitis ([place])     LOS: 3 days   Christipher Rieger PA-C 04/11/2021, 11:38 AM

## 2021-04-12 ENCOUNTER — Encounter: Payer: Self-pay | Admitting: Internal Medicine

## 2021-04-12 ENCOUNTER — Other Ambulatory Visit: Payer: Self-pay | Admitting: Pharmacy Technician

## 2021-04-15 ENCOUNTER — Telehealth: Payer: Self-pay | Admitting: Internal Medicine

## 2021-04-15 NOTE — Telephone Encounter (Signed)
TOC HFU APPT [facility] FOR 04/23/2021 @ 2:15 PM WITH DR COE.

## 2021-04-17 NOTE — Telephone Encounter (Signed)
Transition Care Management Follow-up Telephone Call  Date of discharge and from where: 04/11/2021  How have you been since you were released from the hospital? "Good"  Any questions or concerns? No  Items Reviewed:  Did the pt receive and understand the discharge instructions provided? Yes   Medications obtained and verified? Yes   Other? Yes   Any new allergies since your discharge? No   Dietary orders reviewed? Yes  Do you have support at home? Yes  (Son and Daughter in [place])  [place] and Equipment/Supplies: Were home health services ordered? No  Functional Questionnaire: (I = Independent and D = Dependent) ADLs: I  Bathing/Dressing- I  Meal Prep- I  Eating- I  Maintaining continence- I  Transferring/Ambulation- I  Managing Meds- I  Follow up appointments reviewed:   PCP Hospital f/u appt confirmed? Yes  Scheduled to see [facility]/Dr. Marianna Payment on 04/23/21 @ 1415.  [facility] f/u appt confirmed? Yes  Scheduled to see Dr. Hilarie Fredrickson on 05/07/21 @ 1430.  Are transportation arrangements needed? No   If their condition worsens, is the pt aware to call PCP or go to the Emergency Dept.? Yes  Was the patient provided with contact information for the PCP's office or ED? Yes  Was to pt encouraged to call back with questions or concerns? Yes

## 2021-04-18 ENCOUNTER — Other Ambulatory Visit: Payer: Self-pay

## 2021-04-18 ENCOUNTER — Other Ambulatory Visit: Payer: Self-pay | Admitting: Internal Medicine

## 2021-04-18 ENCOUNTER — Ambulatory Visit (INDEPENDENT_AMBULATORY_CARE_PROVIDER_SITE_OTHER): Payer: 59

## 2021-04-18 ENCOUNTER — Telehealth: Payer: Self-pay | Admitting: Internal Medicine

## 2021-04-18 ENCOUNTER — Telehealth: Payer: Self-pay

## 2021-04-18 VITALS — BP 144/80 | HR 79 | Temp 98.5°F | Resp 16

## 2021-04-18 DIAGNOSIS — K31811 Angiodysplasia of stomach and duodenum with bleeding: Secondary | ICD-10-CM

## 2021-04-18 DIAGNOSIS — K5520 Angiodysplasia of colon without hemorrhage: Secondary | ICD-10-CM | POA: Diagnosis not present

## 2021-04-18 DIAGNOSIS — Z794 Long term (current) use of insulin: Secondary | ICD-10-CM

## 2021-04-18 DIAGNOSIS — D500 Iron deficiency anemia secondary to blood loss (chronic): Secondary | ICD-10-CM

## 2021-04-18 DIAGNOSIS — E119 Type 2 diabetes mellitus without complications: Secondary | ICD-10-CM

## 2021-04-18 MED ORDER — SODIUM CHLORIDE 0.9 % IV SOLN
510.0000 mg | Freq: Once | INTRAVENOUS | Status: AC
  Administered 2021-04-18: 510 mg via INTRAVENOUS
  Filled 2021-04-18: qty 17

## 2021-04-18 MED ORDER — METHYLPREDNISOLONE SODIUM SUCC 125 MG IJ SOLR: 125.0000 mg | Freq: Once | INTRAMUSCULAR | Status: DC | PRN

## 2021-04-18 MED ORDER — EPINEPHRINE 0.3 MG/0.3ML IJ SOAJ: 0.3000 mg | Freq: Once | INTRAMUSCULAR | Status: DC | PRN

## 2021-04-18 MED ORDER — GLUCOSE BLOOD VI STRP: ORAL_STRIP | 12 refills | Status: DC

## 2021-04-18 MED ORDER — ONETOUCH ULTRA MINI W/DEVICE KIT: 1.0000 [IU] | PACK | Freq: Four times a day (QID) | 0 refills | Status: DC

## 2021-04-18 MED ORDER — ALBUTEROL SULFATE HFA 108 (90 BASE) MCG/ACT IN AERS: 2.0000 | INHALATION_SPRAY | Freq: Once | RESPIRATORY_TRACT | Status: DC | PRN

## 2021-04-18 MED ORDER — DIPHENHYDRAMINE HCL 50 MG/ML IJ SOLN: 50.0000 mg | Freq: Once | INTRAMUSCULAR | Status: DC | PRN

## 2021-04-18 MED ORDER — SODIUM CHLORIDE 0.9 % IV SOLN: Freq: Once | INTRAVENOUS | Status: DC | PRN

## 2021-04-18 MED ORDER — FAMOTIDINE IN NACL 20-0.9 MG/50ML-% IV SOLN: 20.0000 mg | Freq: Once | INTRAVENOUS | Status: DC | PRN

## 2021-04-18 MED ORDER — ONETOUCH DELICA LANCETS 30G MISC: 1.0000 | Freq: Four times a day (QID) | 3 refills | Status: DC

## 2021-04-18 NOTE — Telephone Encounter (Signed)
Patient called states she feels really bad since her infusion seeking advise.

## 2021-04-18 NOTE — Telephone Encounter (Signed)
RTC, patient states her Freestyle Libre sensor is not working.  She is requesting a RX for a glucose monitor, test strips, and lancets be sent to her pharmacy so she can check her sugars. SChaplin, RN,BSN

## 2021-04-18 NOTE — Telephone Encounter (Signed)
Pt is requesting a call back she is a new pt and has an appt on the 26 W/Dr Marianna Payment  Stated that her glucose machine broke and she is in need of a new one her pharmacy told her to call here before she just moved here to see if we can help

## 2021-04-18 NOTE — Telephone Encounter (Signed)
Pt got feraheme today around 11:30am. States she was feeling ok this am but feels bad now. She could not really describe her symptoms well over the phone. Reports her stomach feels bad. Discussed with her she could always go to an urgent care or the ER if she felt that bad. Pt states she will give it some time and see how she feels. Reports it in not that bad at this point.

## 2021-04-18 NOTE — Progress Notes (Signed)
Diagnosis: Iron Deficiency Anemia  Provider:  Marshell Garfinkel, MD  Procedure: Infusion  IV Type: Peripheral, IV Location: R Antecubital  Feraheme (Ferumoxytol), Dose: 510 mg  Infusion Start Time: 1120  Infusion Stop Time: 9692  Post Infusion IV Care: Observation period completed  Discharge: Condition: Good, Destination: Home . AVS provided to patient.   Performed by:  Paul Dykes, RN

## 2021-04-18 NOTE — Telephone Encounter (Signed)
Order is in.

## 2021-04-19 MED ORDER — ONETOUCH VERIO W/DEVICE KIT: 1.0000 [IU] | PACK | Freq: Three times a day (TID) | 0 refills | Status: AC

## 2021-04-19 MED ORDER — ONETOUCH VERIO VI STRP
1.0000 | ORAL_STRIP | Freq: Three times a day (TID) | 12 refills | Status: DC
Start: 2021-04-19 — End: 2021-05-23

## 2021-04-19 MED ORDER — LANCETS MISC
1.0000 [IU] | Freq: Three times a day (TID) | 2 refills | Status: AC
Start: 2021-04-19 — End: ?

## 2021-04-19 NOTE — Telephone Encounter (Signed)
Refill Request- Rec'd call from the pharmacy requesting a call back in reference to the pt's meter.  Per Pharmacy a RX was sent for the One Touch Ultra Mini which has been discontinued.  The following pharmacy is requesting RX  For the One Touch Verio Meter and One touch Verio Strips- Instructions needed. to be sent to the following:  [facility] (Ph: [phone])

## 2021-04-19 NOTE — Telephone Encounter (Signed)
Orders sent

## 2021-04-19 NOTE — Telephone Encounter (Signed)
TC to [facility] and spoke with pharmacist, last name Ronny Flurry.  RN tried to give a verbal order for the one touch verio meter, lancets, and test strips for TID testing with DX code of E.11.9, but pharmacist refuses to take a VO and states she must have written RX's due to patient having medicare part B.  Will forward to on call physician to please assist sending new DM meter, test strips, and lancets via [place] so patient can have her supplies for the weekend. Thank you, SChaplin, RN,BSN

## 2021-04-23 ENCOUNTER — Other Ambulatory Visit: Payer: Self-pay

## 2021-04-23 ENCOUNTER — Ambulatory Visit (INDEPENDENT_AMBULATORY_CARE_PROVIDER_SITE_OTHER): Payer: 59 | Admitting: Internal Medicine

## 2021-04-23 VITALS — BP 148/77 | HR 79 | Temp 98.3°F | Wt 235.0 lb

## 2021-04-23 DIAGNOSIS — D509 Iron deficiency anemia, unspecified: Secondary | ICD-10-CM

## 2021-04-23 DIAGNOSIS — I509 Heart failure, unspecified: Secondary | ICD-10-CM

## 2021-04-23 DIAGNOSIS — I159 Secondary hypertension, unspecified: Secondary | ICD-10-CM | POA: Diagnosis not present

## 2021-04-23 DIAGNOSIS — I480 Paroxysmal atrial fibrillation: Secondary | ICD-10-CM

## 2021-04-23 DIAGNOSIS — Z951 Presence of aortocoronary bypass graft: Secondary | ICD-10-CM

## 2021-04-23 DIAGNOSIS — Z794 Long term (current) use of insulin: Secondary | ICD-10-CM

## 2021-04-23 DIAGNOSIS — E119 Type 2 diabetes mellitus without complications: Secondary | ICD-10-CM | POA: Diagnosis not present

## 2021-04-23 DIAGNOSIS — I25810 Atherosclerosis of coronary artery bypass graft(s) without angina pectoris: Secondary | ICD-10-CM

## 2021-04-23 DIAGNOSIS — Z8719 Personal history of other diseases of the digestive system: Secondary | ICD-10-CM

## 2021-04-23 DIAGNOSIS — B3781 Candidal esophagitis: Secondary | ICD-10-CM

## 2021-04-23 MED ORDER — EMPAGLIFLOZIN 10 MG PO TABS: 10.0000 mg | ORAL_TABLET | Freq: Every day | ORAL | 0 refills | Status: DC

## 2021-04-23 NOTE — Patient Instructions (Signed)
Thank you, [name] for allowing [place] to provide your care today. Today we discussed diabetes, heart failure, blood pressure, GI bleed.    I have ordered the following labs for you:   Lab Orders     CBC no Diff     BMP8+Anion Gap   Tests ordered today:  none  Referrals ordered today:    Referral Orders     Ambulatory referral to Ophthalmology     Ambulatory referral to Cardiology   Medication Changes:   There are no discontinued medications.   Meds ordered this encounter  Medications  . empagliflozin (JARDIANCE) 10 MG TABS tablet    Sig: Take 1 tablet (10 mg total) by mouth daily before breakfast.    Dispense:  30 tablet    Refill:  0       Follow up: 1 month   Remember:    Should you have any questions or concerns please call the internal medicine clinic at [phone].     Marianna Payment, D.O. [place]

## 2021-04-23 NOTE — Progress Notes (Signed)
CC: GI bleed, hospital follow up.  HPI:  [name] is a 75 y.o. female with a past medical history stated below and presents today for GI bleed. Please see problem based assessment and plan for additional details.  No past medical history on file.  Current Outpatient Medications on File Prior to Visit  Medication Sig Dispense Refill  . albuterol (ACCUNEB) 0.63 MG/3ML nebulizer solution Take 1 ampule by nebulization every 6 (six) hours as needed for wheezing or shortness of breath.    . Blood Glucose Monitoring Suppl (ONETOUCH VERIO) w/Device KIT 1 Units by Does not apply route 3 (three) times daily. 1 kit 0  . ferrous sulfate 325 (65 FE) MG tablet Take 325 mg by mouth daily with breakfast.    . fluconazole (DIFLUCAN) 200 MG tablet Take 1 tablet (200 mg total) by mouth daily. 13 tablet 0  . gabapentin (NEURONTIN) 100 MG capsule Take 200 mg by mouth 3 (three) times daily.    Marland Kitchen glucose blood (ONETOUCH VERIO) test strip 1 each by Other route 3 (three) times daily. Use as instructed 100 each 12  . insulin degludec (TRESIBA FLEXTOUCH) 100 UNIT/ML FlexTouch Pen Inject 54 Units into the skin at bedtime.    . insulin lispro (HUMALOG) 100 UNIT/ML injection Inject 15 Units into the skin 3 (three) times daily with meals.    . Ipratropium-Albuterol (COMBIVENT RESPIMAT) 20-100 MCG/ACT AERS respimat Inhale 1 puff into the lungs 4 (four) times daily.    Marland Kitchen ipratropium-albuterol (DUONEB) 0.5-2.5 (3) MG/3ML SOLN Take 3 mLs by nebulization 4 (four) times daily.    . Lancets MISC 1 Units by Does not apply route in the morning, at noon, and at bedtime. 100 each 2  . losartan (COZAAR) 100 MG tablet Take 100 mg by mouth daily.    Marland Kitchen lovastatin (MEVACOR) 20 MG tablet Take 20 mg by mouth every evening.    . metoprolol succinate (TOPROL-XL) 100 MG 24 hr tablet Take 100 mg by mouth daily. Take with or immediately following a meal.    . polyethylene glycol (MIRALAX / GLYCOLAX) 17 g packet Take 17 g by  mouth as needed for mild constipation.    . rivaroxaban (XARELTO) 20 MG TABS tablet Take 1 tablet (20 mg total) by mouth every morning. 30 tablet 0  . umeclidinium-vilanterol (ANORO ELLIPTA) 62.5-25 MCG/INH AEPB Inhale 1 puff into the lungs daily. 14 each 0  . Vitamin D, Ergocalciferol, (DRISDOL) 1.25 MG (50000 UNIT) CAPS capsule Take 50,000 Units by mouth every 7 (seven) days. Mondays     No current facility-administered medications on file prior to visit.    No family history on file.  Social History   Socioeconomic History  . Marital status: Single    Spouse name: Not on file  . Number of children: Not on file  . Years of education: Not on file  . Highest education level: Not on file  Occupational History  . Not on file  Tobacco Use  . Smoking status: Current Some Day Smoker  . Smokeless tobacco: Never Used  Substance and Sexual Activity  . Alcohol use: Not on file  . Drug use: Not on file  . Sexual activity: Not on file  Other Topics Concern  . Not on file  Social History Narrative  . Not on file   Social Determinants of Health   Financial Resource Strain: Not on file  Food Insecurity: Not on file  Transportation Needs: Not on file  Physical Activity: Not on file  Stress: Not on file  Social Connections: Not on file  Intimate Partner Violence: Not on file    Review of Systems: ROS negative except for what is noted on the assessment and plan.  Vitals:   04/23/21 1414  BP: (!) 148/77  Pulse: 79  Temp: 98.3 F (36.8 C)  TempSrc: Oral  SpO2: 97%  Weight: 235 lb (106.6 kg)    Physical Exam: Gen: A&O x3 and in no apparent distress, well appearing and nourished. HEENT: Head - normocephalic, atraumatic. Eye -  visual acuity grossly intact, conjunctiva clear, sclera non-icteric, EOM intact. Mouth - No obvious caries or periodontal disease. Neck: no obvious masses or nodules, AROM intact. CV: RRR, no murmurs, rubs, or gallops. S1/S2 presents  Resp: Clear to  ascultation bilaterally  Abd: BS (+) x4, soft, non-tender, without obvious hepatosplenomegaly or masses MSK: Grossly normal AROM and strength x4 extremities. Trace lower extremity edema present. Skin: good skin turgor, no rashes, unusual bruising, or prominent lesions.  Neuro: No focal deficits, grossly normal sensation and coordination.  Psych: Oriented x3 and responding appropriately. Intact recent and remote memory, normal mood, judgement, affect , and insight.    Assessment & Plan:   See Encounters Tab for problem based charting.  Patient discussed with Dr. Elyn Peers, D.O. [facility], PGY-2 Pager: [phone], Phone: [phone] Date 04/25/2021 Time 10:06 AM

## 2021-04-24 LAB — CBC
Hematocrit: 39.4 % (ref 34.0–46.6)
Hemoglobin: 11.9 g/dL (ref 11.1–15.9)
MCH: 26.3 pg — ABNORMAL LOW (ref 26.6–33.0)
MCHC: 30.2 g/dL — ABNORMAL LOW (ref 31.5–35.7)
MCV: 87 fL (ref 79–97)
Platelets: 301 10*3/uL (ref 150–450)
RBC: 4.53 x10E6/uL (ref 3.77–5.28)
RDW: 18 % — ABNORMAL HIGH (ref 11.7–15.4)
WBC: 10.1 10*3/uL (ref 3.4–10.8)

## 2021-04-24 LAB — BMP8+ANION GAP
Anion Gap: 17 mmol/L (ref 10.0–18.0)
BUN/Creatinine Ratio: 10 — ABNORMAL LOW (ref 12–28)
BUN: 6 mg/dL — ABNORMAL LOW (ref 8–27)
CO2: 25 mmol/L (ref 20–29)
Calcium: 10.1 mg/dL (ref 8.7–10.3)
Chloride: 98 mmol/L (ref 96–106)
Creatinine, Ser: 0.62 mg/dL (ref 0.57–1.00)
Glucose: 122 mg/dL — ABNORMAL HIGH (ref 65–99)
Potassium: 4.1 mmol/L (ref 3.5–5.2)
Sodium: 140 mmol/L (ref 134–144)
eGFR: 93 mL/min/{1.73_m2} (ref >59)

## 2021-04-25 ENCOUNTER — Encounter: Payer: Self-pay | Admitting: Internal Medicine

## 2021-04-25 DIAGNOSIS — I480 Paroxysmal atrial fibrillation: Secondary | ICD-10-CM | POA: Insufficient documentation

## 2021-04-25 NOTE — Assessment & Plan Note (Addendum)
Patient states that she has a history of paroxysmal atrial fibrillation.  She admits to having what sounds like a loop recorder implanted from her previous cardiologist.  She is recently moved to this area to be closer to family.  She is on anticoagulation with Xarelto.  This was recently held during hospitalization for GI bleed.  She has since restarted the medication without any signs or symptoms of bleeding.  She states she is tolerating this medication well and does not have any symptoms concerning for atrial fibrillation at this time.  Plan: -Continue Xarelto 20 mg daily -We will perform a CBC to check hemoglobin we will -Patient appears to be in sinus rhythm on exam but EKG was not performed to confirm -We will refer to cardiology for further evaluation and management

## 2021-04-25 NOTE — Assessment & Plan Note (Signed)
Patient presents for reevaluation of her blood pressure.  Today it is 148/77 on losartan 100 mg daily and metoprolol 100 mg daily.  I will not add additional medications at this time.  This will need to be closely monitored and adjusted as needed.   Plan: - BMP today - Cont current hypertension medications

## 2021-04-25 NOTE — Assessment & Plan Note (Addendum)
Patient presents after a hospitalization for GI bleed.  She was given a transfusion of Feraheme in the hospital and upper endoscopy and colonoscopy was performed showing ectasias.  Her anticoagulation was held during this hospitalization and has since been restarted.  She denies any overt signs of bleeding and states that she is feeling much better.  Plan: -Continue Xarelto -Continue oral iron supplementation -We will check CBC today.  May need additional Feraheme transfusion.

## 2021-04-25 NOTE — Assessment & Plan Note (Signed)
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  She is on lovastatin 20 mg daily.  She would likely benefit from switching to high intensity statin with atorvastatin or rosuvastatin.  I do not have a recent lipid panel and have not discussed with her or not she has had intolerances to these medications.  This would be worth looking into in the future.  Plan: -Record release for past history of statin use and lipid panel -Discuss medication management at follow-up appointment.

## 2021-04-25 NOTE — Assessment & Plan Note (Signed)
Patient states that she is on her last few days of her 14-day course of fluconazole and tolerating it well.  She denies any symptoms of dysphagia, odynophagia, or globus sensation.  Counseled her to continue this medication as prescribed.

## 2021-04-25 NOTE — Assessment & Plan Note (Signed)
Patient has a history of heart failure.  I do not have any past medical records to indicate whether or not this is heart failure with reduced ejection fraction or preserved.  Patient is on metoprolol 100 mg daily an  losartan 100 mg daily.  However, she is not on any diuretic therapy.  She appears euvolemic on exam with minimal lower extremity edema.  She denies worsening shortness of breath, or orthopnea but states that she does not lay flat at night.  Plan: -Continue current medications -Hold off on diuretic therapy -Counseled regarding salt restriction and fluid restriction -We will get record release to get her records from her last cardiologist Dr. Moshe Cipro. -We will refer to cardiology for further evaluation and management as she has an implantable cardiac monitor

## 2021-04-25 NOTE — Assessment & Plan Note (Signed)
Patient presents for reevaluation of her diabetes.  She is currently on Tresiba 54 units daily and Humalog 15 units 3 times daily with meals.  Her last A1c in April 2022 was 6.3.  She states that she does have a glucometer and measures her blood sugar daily.  She states that it ranges from 1 75-2 20 daily despite being adherent to her current medication management she denies any hypoglycemia or symptomatic hyperglycemia.  She is tolerating her medications well  Plan: -Continue current insulin management. -Although I am not sure if she has HFpEF or HFrEF, I believe she would still be a great candidate for an SGLT2 inhibitor.  Specifically because of her significant comorbidities putting her at high risk for complications.  Therefore I will start Jardiance 10 mg today.  I counseled the patient regarding this medication and she is in agreement.

## 2021-04-26 NOTE — Progress Notes (Signed)
Internal Medicine Clinic Attending  Case discussed with Dr. Coe  At the time of the visit.  We reviewed the resident's history and exam and pertinent patient test results.  I agree with the assessment, diagnosis, and plan of care documented in the resident's note.  

## 2021-05-01 ENCOUNTER — Encounter: Payer: Self-pay | Admitting: *Deleted

## 2021-05-03 ENCOUNTER — Encounter: Payer: Self-pay | Admitting: *Deleted

## 2021-05-07 ENCOUNTER — Encounter: Payer: Self-pay | Admitting: Internal Medicine

## 2021-05-07 ENCOUNTER — Ambulatory Visit (INDEPENDENT_AMBULATORY_CARE_PROVIDER_SITE_OTHER): Payer: 59 | Admitting: Internal Medicine

## 2021-05-07 VITALS — BP 132/70 | HR 88 | Ht 64.0 in | Wt 234.0 lb

## 2021-05-07 DIAGNOSIS — K5903 Drug induced constipation: Secondary | ICD-10-CM

## 2021-05-07 DIAGNOSIS — K5520 Angiodysplasia of colon without hemorrhage: Secondary | ICD-10-CM

## 2021-05-07 DIAGNOSIS — D500 Iron deficiency anemia secondary to blood loss (chronic): Secondary | ICD-10-CM

## 2021-05-07 MED ORDER — POLYETHYLENE GLYCOL 3350 17 G PO PACK: 17.0000 g | PACK | ORAL | 3 refills | Status: AC | PRN

## 2021-05-07 NOTE — Patient Instructions (Addendum)
If you are age 75 or older, your body mass index should be between 23-30. Your Body mass index is 40.17 kg/m. If this is out of the aforementioned range listed, please consider follow up with your Primary Care Provider.  Your provider has requested that you go to the basement level for lab work on June 28, 2021. Press "B" on the elevator. The lab is located at the first door on the left as you exit the elevator.  Due to recent changes in healthcare laws, you may see the results of your imaging and laboratory studies on MyChart before your provider has had a chance to review them.  We understand that in some cases there may be results that are confusing or concerning to you. Not all laboratory results come back in the same time frame and the provider may be waiting for multiple results in order to interpret others.  Please give [place] 48 hours in order for your provider to thoroughly review all the results before contacting the office for clarification of your results.   We have sent the following medications to your pharmacy for you to pick up at your convenience:  Miralax  Continue Iron.  Thank you for trusting me with your gastrointestinal care!    Zenovia Jarred, MD

## 2021-05-07 NOTE — Progress Notes (Signed)
Subjective:    Patient ID: Caitlyn Lawrence, female    DOB: 1946-09-08, 75 y.o.   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  She also has a history of atrial fibrillation and takes Xarelto, diabetes, hypertension and CAD.  During her hospitalization she had an upper endoscopy and colonoscopy.  Upper endoscopy revealed 9 duodenal angioectasias ablated with APC and Candida esophagitis.  Colonoscopy revealed 5 colonic angioectasias ablated with APC after being lifted with diluted epinephrine injection.  Also adenomatous colon polyps were removed.  She received Feraheme on 04/11/2021 while hospitalized and a second dose on 04/18/2021.  She reports she has been doing much better.  She has much improved energy levels.  She has seen dark stools with oral iron but no "sticky" tarry stools like she was having prior to hospitalization.  No red blood in stool.  Good appetite.  No abdominal pain.  Some mild constipation with the oral iron but MiraLAX is working very well to maintain daily bowel movements.  No diarrhea.  No heartburn or dysphagia symptom.  No odynophagia.  She did feel poorly on the day after her second Feraheme infusion.  Just a general feeling of unwellness but no trouble with breathing, swelling or chest pain.  No fevers.  This was a very transient bad feeling lasting less than 12 hours.  She has resumed Xarelto.   Review of Systems As per HPI, otherwise negative  Current Medications, Allergies, Past Medical History, Past Surgical History, Family History and Social History were reviewed in [employer] record.     Objective:   Physical Exam BP 132/70   Pulse 88   Ht 5\' 4"  (1.626 m)   Wt 234 lb (106.1 kg)   BMI 40.17 kg/m  Gen: awake,  alert, NAD HEENT: anicteric, op clear CV: RRR, no mrg Pulm: CTA b/l Abd: soft, NT/ND, +BS throughout Ext: no c/c/e Neuro: nonfocal  CBC Latest Ref Rng & Units 04/23/2021 04/11/2021 04/10/2021  WBC 3.4 - 10.8 x10E3/uL 10.1 10.2 11.5(H)  Hemoglobin 11.1 - 15.9 g/dL 11.9 8.7(L) 8.4(L)  Hematocrit 34.0 - 46.6 % 39.4 27.7(L) 27.0(L)  Platelets 150 - 450 x10E3/uL 301 302 291          Assessment & Plan:  75 year old female with recent hospitalization for melena, iron deficiency anemia due to chronic GI blood loss, 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  1.  IDA due to chronic blood loss/small bowel and colonic angioectasias --history of bleeding small and large bowel angioectasias in the setting of chronic anticoagulation leading to profound IDA.  We ablated 14 angioectasias lesions at upper endoscopy and colonoscopy last month.  Hemoglobin continues to improve and was 11.9 when checked 2 weeks ago.  We will need to continue to watch blood counts and iron studies going forward.  Would only plan repeat endoscopy for ablation of angioectasias in the event that she has progressive/recurrent anemia or overt bleeding despite iron supplementation. -- Continue ferrous sulfate 325 mg daily -- Check CBC, IBC plus ferritin on 06/28/2021; if normal repeat roughly every 3 months  2.  Constipation --mild and exacerbated by oral iron. -- Continue MiraLAX 17 g daily  3.  Adenomatous colon polyps --3  subcentimeter adenomas without high-grade dysplasia.  Surveillance colonoscopy would be in the 3 to 5-year range but may be deferred based on age.  We can discuss this in follow-up over time.  20 minutes total spent today including patient facing time, coordination of care, reviewing medical history/procedures/pertinent radiology studies, and documentation of the encounter.

## 2021-05-09 LAB — HM DIABETES EYE EXAM

## 2021-05-16 ENCOUNTER — Telehealth: Payer: Self-pay | Admitting: *Deleted

## 2021-05-16 NOTE — Telephone Encounter (Signed)
Spoke with D. Fulmore, SW at [facility] regarding FL2 form needing updates. Areas that were left blank needed to have NA written, and Recommended LOC needed to be domiciliary. These changes have been made and faxed back to D. Fulmore at [phone]. Fax confirmation receipt received. This program is not to place patient but to give her a monthly check to stay in her home. D states patient has all the resources she needs ie family support and Case Worker, to stay safely in her home.

## 2021-05-16 NOTE — Telephone Encounter (Signed)
Thank you :)

## 2021-05-17 ENCOUNTER — Encounter: Payer: 59 | Admitting: Student

## 2021-05-20 ENCOUNTER — Encounter: Payer: Self-pay | Admitting: *Deleted

## 2021-05-23 ENCOUNTER — Encounter: Payer: Self-pay | Admitting: Student

## 2021-05-23 ENCOUNTER — Ambulatory Visit (INDEPENDENT_AMBULATORY_CARE_PROVIDER_SITE_OTHER): Payer: 59 | Admitting: Student

## 2021-05-23 VITALS — BP 137/73 | HR 82 | Temp 98.1°F | Ht 64.0 in | Wt 239.3 lb

## 2021-05-23 DIAGNOSIS — I480 Paroxysmal atrial fibrillation: Secondary | ICD-10-CM

## 2021-05-23 DIAGNOSIS — I509 Heart failure, unspecified: Secondary | ICD-10-CM

## 2021-05-23 DIAGNOSIS — E119 Type 2 diabetes mellitus without complications: Secondary | ICD-10-CM | POA: Diagnosis not present

## 2021-05-23 DIAGNOSIS — Z794 Long term (current) use of insulin: Secondary | ICD-10-CM

## 2021-05-23 DIAGNOSIS — J441 Chronic obstructive pulmonary disease with (acute) exacerbation: Secondary | ICD-10-CM

## 2021-05-23 DIAGNOSIS — Z0000 Encounter for general adult medical examination without abnormal findings: Secondary | ICD-10-CM

## 2021-05-23 DIAGNOSIS — I159 Secondary hypertension, unspecified: Secondary | ICD-10-CM

## 2021-05-23 MED ORDER — IPRATROPIUM-ALBUTEROL 0.5-2.5 (3) MG/3ML IN SOLN: 3.0000 mL | Freq: Four times a day (QID) | RESPIRATORY_TRACT | 3 refills | Status: DC

## 2021-05-23 MED ORDER — UMECLIDINIUM-VILANTEROL 62.5-25 MCG/INH IN AEPB: 1.0000 | INHALATION_SPRAY | Freq: Every day | RESPIRATORY_TRACT | 0 refills | Status: DC

## 2021-05-23 MED ORDER — TRESIBA FLEXTOUCH 100 UNIT/ML ~~LOC~~ SOPN: 54.0000 [IU] | PEN_INJECTOR | Freq: Every day | SUBCUTANEOUS | 5 refills | Status: DC

## 2021-05-23 MED ORDER — ALBUTEROL SULFATE 0.63 MG/3ML IN NEBU: 1.0000 | INHALATION_SOLUTION | Freq: Four times a day (QID) | RESPIRATORY_TRACT | 3 refills | Status: DC | PRN

## 2021-05-23 MED ORDER — ONETOUCH DELICA LANCETS 30G MISC: 3 refills | Status: DC

## 2021-05-23 MED ORDER — RIVAROXABAN 20 MG PO TABS: 20.0000 mg | ORAL_TABLET | Freq: Every morning | ORAL | 0 refills | Status: DC

## 2021-05-23 MED ORDER — METOPROLOL SUCCINATE ER 100 MG PO TB24: 100.0000 mg | ORAL_TABLET | Freq: Every day | ORAL | 3 refills | Status: DC

## 2021-05-23 MED ORDER — EMPAGLIFLOZIN 10 MG PO TABS: 10.0000 mg | ORAL_TABLET | Freq: Every day | ORAL | 0 refills | Status: DC

## 2021-05-23 MED ORDER — ONETOUCH VERIO VI STRP: 1.0000 | ORAL_STRIP | Freq: Three times a day (TID) | 12 refills | Status: DC

## 2021-05-23 NOTE — Assessment & Plan Note (Signed)
Assessment: Patient with history of type 2 diabetes, insulin-dependent.  Current regimen of Jardiance 10 mg daily, Tresiba 54 units daily at bedtime, lispro 15 units 3 times daily with meals.  Last A1c was 6.4 in April.  She states that since then she has been eating sweets more often, once daily.  We discussed decreasing these to once a week will improve her overall sugars.  Her average glucose reading was 184.  She had no lows and had a high of under 300.  We discussed dietary changes, which patient would prefer during rather than adjusting her insulin.  We will plan to repeat her A1c in 2 months.  At this time if her sugars are still elevated and her A1c is higher than goal, will have additional discussion about adjusting insulin.  Patient would like to meet with diabetic counselor Debera Lat.  Plan: -Continue Tresiba 54 units daily bedtime, Humalog 15 units 3 times daily with meals, Jardiance. -Refilled patient's lancets, test strips -Referral to diabetes counseling

## 2021-05-23 NOTE — Assessment & Plan Note (Signed)
Assessment: BP of 137/77. Current regimen of losartan 100 mg daily metoprolol 100 mg daily.  Do not believe patient needs additional medication at this time or adjustments.  We will continue on this regimen.   Plan: -Continue losartan 100 mg daily metoprolol on her milligrams daily

## 2021-05-23 NOTE — Assessment & Plan Note (Signed)
Assessment: Patient with past medical history of paroxysmal A. fib.  Patient states that she can feel when she is in A. fib she has not felt that sometime.  She is adherent to her metoprolol as well as her Xarelto.  Plan: -Refill Xarelto 20 mg daily -Continue metoprolol succinate XL 100 mg daily

## 2021-05-23 NOTE — Patient Instructions (Signed)
Thank you, Caitlyn Lawrence for allowing [place] to provide your care today. Today we discussed   COPD Please continue with your inhalers.  We will be referring you to local pulmonologist.  If you find that your breathing is worsening, your cough becomes worse, or you start having symptoms of fevers, chilll, please call our clinic.  If she feels that you are having a medical emergency and have difficulty breathing please call 911 or go to your closest emergency department.  Diabetes I have placed a referral for you to meet with our diabetes coordinator.  Please continue your insulin as well as your Jardiance.  We will be rechecking your hemoglobin A1c at your next visit in 2 months.  High blood pressure Please check your blood pressures at home.  If you notice that they are persistently high, please call our clinic.  We may have to make medication adjustments at that time.   I have refilled most your medications.   I have ordered the following labs for you:  Lab Orders  No laboratory test(s) ordered today     Referrals ordered today:    Referral Orders     Referral to Nutrition and Diabetes Services     Ambulatory referral to Pulmonology   I have ordered the following medication/changed the following medications:   Stop the following medications: Medications Discontinued During This Encounter  Medication Reason  . albuterol (ACCUNEB) 0.63 MG/3ML nebulizer solution Reorder  . ipratropium-albuterol (DUONEB) 0.5-2.5 (3) MG/3ML SOLN Reorder  . metoprolol succinate (TOPROL-XL) 100 MG 24 hr tablet Reorder  . insulin degludec (TRESIBA FLEXTOUCH) 100 UNIT/ML FlexTouch Pen Reorder  . rivaroxaban (XARELTO) 20 MG TABS tablet Reorder  . umeclidinium-vilanterol (ANORO ELLIPTA) 62.5-25 MCG/INH AEPB Reorder  . glucose blood (ONETOUCH VERIO) test strip Reorder  . empagliflozin (JARDIANCE) 10 MG TABS tablet Reorder     Start the following medications: Meds ordered this encounter   Medications  . umeclidinium-vilanterol (ANORO ELLIPTA) 62.5-25 MCG/INH AEPB    Sig: Inhale 1 puff into the lungs daily.    Dispense:  14 each    Refill:  0  . metoprolol succinate (TOPROL-XL) 100 MG 24 hr tablet    Sig: Take 1 tablet (100 mg total) by mouth daily. Take with or immediately following a meal.    Dispense:  90 tablet    Refill:  3  . empagliflozin (JARDIANCE) 10 MG TABS tablet    Sig: Take 1 tablet (10 mg total) by mouth daily before breakfast.    Dispense:  30 tablet    Refill:  0  . rivaroxaban (XARELTO) 20 MG TABS tablet    Sig: Take 1 tablet (20 mg total) by mouth every morning.    Dispense:  30 tablet    Refill:  0  . glucose blood (ONETOUCH VERIO) test strip    Sig: 1 each by Other route 3 (three) times daily. Use as instructed    Dispense:  100 each    Refill:  12    DX Code E 11.9  . insulin degludec (TRESIBA FLEXTOUCH) 100 UNIT/ML FlexTouch Pen    Sig: Inject 54 Units into the skin at bedtime.    Dispense:  3 mL    Refill:  5  . albuterol (ACCUNEB) 0.63 MG/3ML nebulizer solution    Sig: Take 3 mLs (0.63 mg total) by nebulization every 6 (six) hours as needed for wheezing or shortness of breath.    Dispense:  75 mL    Refill:  3  . ipratropium-albuterol (DUONEB) 0.5-2.5 (3) MG/3ML SOLN    Sig: Take 3 mLs by nebulization 4 (four) times daily.    Dispense:  360 mL    Refill:  3     Follow up: 2-3 months    Should you have any questions or concerns please call the internal medicine clinic at [phone].     Sanjuana Letters, D.O. [place]

## 2021-05-23 NOTE — Assessment & Plan Note (Signed)
Patient would like to wait on tetanus vaccine as well as hepatitis C testing.

## 2021-05-23 NOTE — Progress Notes (Signed)
CC: COPD, HTN, Diabetes Mellitus  HPI:  [name] is a 75 y.o. female with a past medical history stated below and presents today for discussion management of her COPD, HTN, DM. Please see problem based assessment and plan for additional details.  Past Medical History:  Diagnosis Date  . Angiectasia    of colon and duodenum  . CAD (coronary artery disease)   . Candidal esophagitis ([place])   . COPD with acute exacerbation ([place])   . Diabetes ([place])   . Diverticulosis   . GI bleed   . H. pylori infection 10/2017  . Heart failure ([place])   . Hypertension   . IDA (iron deficiency anemia)   . Internal hemorrhoids   . MI (myocardial infarction) ([place])   . Osteoarthritis   . Pancreatic steatorrhea   . Paroxysmal atrial fibrillation ([facility])   . Tubular adenoma of colon     Current Outpatient Medications on File Prior to Visit  Medication Sig Dispense Refill  . Blood Glucose Monitoring Suppl (ONETOUCH VERIO) w/Device KIT 1 Units by Does not apply route 3 (three) times daily. 1 kit 0  . ferrous sulfate 325 (65 FE) MG tablet Take 325 mg by mouth daily with breakfast.    . fluconazole (DIFLUCAN) 200 MG tablet Take 1 tablet (200 mg total) by mouth daily. 13 tablet 0  . gabapentin (NEURONTIN) 100 MG capsule Take 200 mg by mouth 3 (three) times daily.    . insulin lispro (HUMALOG) 100 UNIT/ML injection Inject 15 Units into the skin 3 (three) times daily with meals.    . Ipratropium-Albuterol (COMBIVENT RESPIMAT) 20-100 MCG/ACT AERS respimat Inhale 1 puff into the lungs 4 (four) times daily.    . Lancets MISC 1 Units by Does not apply route in the morning, at noon, and at bedtime. 100 each 2  . losartan (COZAAR) 100 MG tablet Take 100 mg by mouth daily.    Marland Kitchen lovastatin (MEVACOR) 20 MG tablet Take 20 mg by mouth every evening.    . polyethylene glycol (MIRALAX / GLYCOLAX) 17 g packet Take 17 g by mouth as needed for mild constipation. 30 each 3  . Vitamin D, Ergocalciferol, (DRISDOL)  1.25 MG (50000 UNIT) CAPS capsule Take 50,000 Units by mouth every 7 (seven) days. Mondays     No current facility-administered medications on file prior to visit.    Family History  Problem Relation Age of Onset  . Aneurysm Mother   . Uterine cancer Mother   . Lung cancer Father   . Diabetes Sister   . Hypertension Sister   . Ovarian cancer Daughter   . Uterine cancer Maternal Aunt   . Lung cancer Paternal Aunt     Social History   Socioeconomic History  . Marital status: Single    Spouse name: Not on file  . Number of children: Not on file  . Years of education: Not on file  . Highest education level: Not on file  Occupational History  . Not on file  Tobacco Use  . Smoking status: Current Some Day Smoker  . Smokeless tobacco: Former Network engineer and Sexual Activity  . Alcohol use: Not Currently  . Drug use: Never  . Sexual activity: Not on file  Other Topics Concern  . Not on file  Social History Narrative  . Not on file   Social Determinants of Health   Financial Resource Strain: Not on file  Food Insecurity: Not on file  Transportation Needs: Not  on file  Physical Activity: Not on file  Stress: Not on file  Social Connections: Not on file  Intimate Partner Violence: Not on file    Review of Systems: ROS negative except for what is noted on the assessment and plan.  Vitals:   05/23/21 1432 05/23/21 1543  BP: (!) 143/72 137/73  Pulse: 88 82  Temp: 98.1 F (36.7 C)   TempSrc: Oral   SpO2: 93%   Weight: 239 lb 4.8 oz (108.5 kg)   Height: 5' 4"  (1.626 m)    Physical Exam: Constitutional: well-appearing, in no acute distress HENT: normocephalic atraumatic Eyes: conjunctiva non-erythematous Neck: supple Cardiovascular: regular rate and rhythm, no m/r/g. Trace lower extremity edema Pulmonary/Chest: normal work of breathing on room air, decreased breath sounds bilaterally and diffuse wheezing Abdominal: soft, non-tender, non-distended MSK: normal  bulk and tone Neurological: alert & oriented x 3, 5/5 strength in bilateral upper and lower extremities, normal gait Skin: warm and dry Psych: normal mood and thought process   Assessment & Plan:   See Encounters Tab for problem based charting.  Patient discussed with Dr. Donnita Falls, D.O. [facility], PGY-1 Pager: [phone], Phone: [phone] Date 05/23/2021 Time 6:10 PM

## 2021-05-23 NOTE — Assessment & Plan Note (Signed)
Assessment: Patient with history of COPD, no prior documentations of PFTs.  Patient was living in [place] for many years.  During her prior visit we were supposed to obtain records however I do not see any in media tab.  Patient did sign release form.  Current COPD regimen of albuterol, Combivent, Ellipta, and home DuoNeb treatments.  States that her COPD has been well controlled until recently when the weather started changing and pollen was excessive.  She notes she went outside recently when air quality decreased and that caused worsening of her COPD.  On exam today she does have decreased breath sounds bilaterally and some wheezing.  Recommended she continue her home treatments and duo nebs.  She denies any worsening of her chronic cough or increasing sputum production.  She denies systemic symptoms of fever or chills.  Do not believe she is having an acute exacerbation at this time.  I recommended to her to call clinic if she feels as though her symptoms are not improving or go to ED if she feels as though she is having a medical emergency.  Will refer patient to pulmonology.  She felt was followed by pulmonologist in [place] prior to moving back to [place].  Plan: -Continue Ellipta, Combivent, albuterol, duo nebs -Pulmonology referral placed

## 2021-05-28 ENCOUNTER — Telehealth: Payer: Self-pay

## 2021-05-28 MED ORDER — "INSULIN SYRINGE-NEEDLE U-100 31G X 15/64"" 0.3 ML MISC": 3 refills | Status: DC

## 2021-05-28 NOTE — Telephone Encounter (Signed)
RTC, states she saw Dr. Raliegh Ip the other day in the office.  She went to pick up her RX's and did not get any insulin syringes.  States she is drawing up from a vial of Humulog. Will forward to PCP. SChaplin, RN,BSN

## 2021-05-28 NOTE — Telephone Encounter (Signed)
Requesting to speak with a nurse about meds, please call pt back.  

## 2021-05-29 ENCOUNTER — Telehealth: Payer: Self-pay

## 2021-05-29 MED ORDER — "INSULIN SYRINGE-NEEDLE U-100 31G X 15/64"" 0.5 ML MISC": 3 refills | Status: AC

## 2021-05-29 NOTE — Telephone Encounter (Signed)
[facility] pharm tech is requesting a call back about pt medicine and needing to make some changes

## 2021-05-29 NOTE — Telephone Encounter (Signed)
Returned call to [employer] at [facility]. Patient is requesting insulin syringes be 0.5 mL instead of 0.3 mL. Verbal auth given. Will route to Yellow Team for agreement/denial. If agree, please change on patient's med list. Thank you.

## 2021-06-01 NOTE — Progress Notes (Signed)
Internal Medicine Clinic Attending  Case discussed with Dr. Katsadouros  At the time of the visit.  We reviewed the resident's history and exam and pertinent patient test results.  I agree with the assessment, diagnosis, and plan of care documented in the resident's note.  

## 2021-06-10 ENCOUNTER — Emergency Department (HOSPITAL_COMMUNITY)
Admission: EM | Admit: 2021-06-10 | Discharge: 2021-06-10 | Discharge status: Against Medical Advice | Payer: 59 | Attending: Emergency Medicine | Admitting: Emergency Medicine

## 2021-06-10 ENCOUNTER — Telehealth: Payer: Self-pay | Admitting: Internal Medicine

## 2021-06-10 ENCOUNTER — Other Ambulatory Visit: Payer: Self-pay

## 2021-06-10 ENCOUNTER — Emergency Department (HOSPITAL_COMMUNITY): Payer: 59

## 2021-06-10 DIAGNOSIS — Z5321 Procedure and treatment not carried out due to patient leaving prior to being seen by health care provider: Secondary | ICD-10-CM | POA: Diagnosis not present

## 2021-06-10 DIAGNOSIS — R42 Dizziness and giddiness: Secondary | ICD-10-CM | POA: Diagnosis not present

## 2021-06-10 DIAGNOSIS — Z7901 Long term (current) use of anticoagulants: Secondary | ICD-10-CM | POA: Insufficient documentation

## 2021-06-10 DIAGNOSIS — K921 Melena: Secondary | ICD-10-CM | POA: Diagnosis not present

## 2021-06-10 DIAGNOSIS — R0602 Shortness of breath: Secondary | ICD-10-CM | POA: Insufficient documentation

## 2021-06-10 DIAGNOSIS — R002 Palpitations: Secondary | ICD-10-CM | POA: Diagnosis not present

## 2021-06-10 LAB — COMPREHENSIVE METABOLIC PANEL
ALT: 16 U/L (ref 0–44)
AST: 17 U/L (ref 15–41)
Albumin: 3.7 g/dL (ref 3.5–5.0)
Alkaline Phosphatase: 74 U/L (ref 38–126)
Anion gap: 9 (ref 5–15)
BUN: 11 mg/dL (ref 8–23)
CO2: 29 mmol/L (ref 22–32)
Calcium: 9.3 mg/dL (ref 8.9–10.3)
Chloride: 101 mmol/L (ref 98–111)
Creatinine, Ser: 0.66 mg/dL (ref 0.44–1.00)
GFR, Estimated: >60 mL/min (ref >60)
Glucose, Bld: 94 mg/dL (ref 70–99)
Potassium: 4.3 mmol/L (ref 3.5–5.1)
Sodium: 139 mmol/L (ref 135–145)
Total Bilirubin: 0.2 mg/dL — ABNORMAL LOW (ref 0.3–1.2)
Total Protein: 7.2 g/dL (ref 6.5–8.1)

## 2021-06-10 LAB — LIPASE, BLOOD: Lipase: 34 U/L (ref 11–51)

## 2021-06-10 LAB — CBC
HCT: 44.5 % (ref 36.0–46.0)
Hemoglobin: 13.9 g/dL (ref 12.0–15.0)
MCH: 27.7 pg (ref 26.0–34.0)
MCHC: 31.2 g/dL (ref 30.0–36.0)
MCV: 88.6 fL (ref 80.0–100.0)
Platelets: 329 10*3/uL (ref 150–400)
RBC: 5.02 MIL/uL (ref 3.87–5.11)
RDW: 16.3 % — ABNORMAL HIGH (ref 11.5–15.5)
WBC: 8.1 10*3/uL (ref 4.0–10.5)
nRBC: 0 % (ref 0.0–0.2)

## 2021-06-10 MED ORDER — ALBUTEROL SULFATE HFA 108 (90 BASE) MCG/ACT IN AERS: 2.0000 | INHALATION_SPRAY | Freq: Once | RESPIRATORY_TRACT | Status: DC

## 2021-06-10 MED ORDER — AEROCHAMBER PLUS FLO-VU LARGE MISC
1.0000 | Freq: Once | Status: DC
  Filled 2021-06-10 (×2): qty 1

## 2021-06-10 NOTE — ED Provider Notes (Signed)
Emergency Medicine Provider Triage Evaluation Note  Caitlyn Lawrence , a 75 y.o. female  was evaluated in triage.  Pt complains of SOB, lightheadedness, palpitations, and dark tarry stools x 2 weeks. Hx of GI bleed in the past, has required multiple transfusions. Anticoagulated on Xarelto for Afib. No bleeding per rectum between bowel movements.   Review of Systems  Positive: Melena, SOB, palpitations, Lightheadedness, fatigue Negative: Hematochezia, syncope, CP, urinary symptoms, fevers.   Physical Exam  BP (!) 149/69 (BP Location: Right Arm)   Pulse 85   Temp 98.6 F (37 C) (Oral)   Resp 18   SpO2 94%  Gen:   Awake, no distress   Resp:  Normal effort  MSK:   Moves extremities without difficulty  Other:  Abdomen soft, nondistended, nontender.  Medical Decision Making  Medically screening exam initiated at 12:34 PM.  Appropriate orders placed.  Caitlyn Lawrence was informed that the remainder of the evaluation will be completed by another provider, this initial triage assessment does not replace that evaluation, and the importance of remaining in the ED until their evaluation is complete.  This chart was dictated using voice recognition software, Dragon. Despite the best efforts of this provider to proofread and correct errors, errors may still occur which can change documentation meaning.    Caitlyn Lawrence 06/10/21 1236    Daleen Bo, MD 06/10/21 [phone]

## 2021-06-10 NOTE — Telephone Encounter (Signed)
Spoke with pt and she is aware to proceed to the ER.

## 2021-06-10 NOTE — ED Triage Notes (Signed)
Pt reports shob and dark tarry stool x 2 weeks. Hx of same. Taking Xarelto.

## 2021-06-10 NOTE — ED Notes (Signed)
Pt called x2 for vitals, no response.

## 2021-06-10 NOTE — Telephone Encounter (Signed)
Pt reports last week she started having dark, tarry stools. States she is having SOB, can't walk from her bedroom to the bathroom without having SOB. She denies taking any pepto recently. Please advise.

## 2021-06-10 NOTE — ED Notes (Signed)
Patient called x3 for vitals with no response

## 2021-06-10 NOTE — ED Notes (Signed)
NA for vitals  °

## 2021-06-10 NOTE — Telephone Encounter (Signed)
Inbound call from pt requesting a call back due to her being concerned about her stools being black. Please advise

## 2021-06-10 NOTE — Telephone Encounter (Signed)
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

## 2021-06-12 LAB — TYPE AND SCREEN
ABO/RH(D): AB POS
Antibody Screen: POSITIVE

## 2021-06-13 ENCOUNTER — Ambulatory Visit: Payer: 59 | Admitting: Dietician

## 2021-06-19 ENCOUNTER — Ambulatory Visit (INDEPENDENT_AMBULATORY_CARE_PROVIDER_SITE_OTHER): Payer: 59 | Admitting: Dietician

## 2021-06-19 ENCOUNTER — Encounter: Payer: Self-pay | Admitting: Dietician

## 2021-06-19 ENCOUNTER — Other Ambulatory Visit: Payer: Self-pay | Admitting: Dietician

## 2021-06-19 DIAGNOSIS — Z794 Long term (current) use of insulin: Secondary | ICD-10-CM

## 2021-06-19 DIAGNOSIS — E119 Type 2 diabetes mellitus without complications: Secondary | ICD-10-CM

## 2021-06-19 MED ORDER — INSULIN PEN NEEDLE 32G X 4 MM MISC: 3 refills | Status: DC

## 2021-06-19 MED ORDER — INSULIN LISPRO (1 UNIT DIAL) 100 UNIT/ML (KWIKPEN): PEN_INJECTOR | SUBCUTANEOUS | 11 refills | Status: DC

## 2021-06-19 NOTE — Patient Instructions (Addendum)
Thank you for your visit today!  We talked about:   Suggest Psyllium husk/Metamucil- follow the directions and you can use this daily  Rec limiting juice to 4 oz a day, better to eat the applesauce than drink juice.  Please record the time, amount and what food drinks and activities you have while wearing the continuous glucose monitor (CGM).  Bring the folder with you to follow up appointments. If your monitor falls off, please place it in the bag provided in your folder and bring it back with you to your next appointment.   Do not have a CT or an MRI while wearing the CGM.   1 week visit has been set up with me and a doctor for the first of two CGM downloads.   You will also return in 2 weeks to have your second download and the CGM removed.  Butch Penny [phone]

## 2021-06-19 NOTE — Progress Notes (Signed)
Medical Nutrition Therapy:  Appt start time: 9381 end time:  1116. Total time: 54 minutes Visit # 1  Assessment:  Primary concerns today: diabetes education Caitlyn Lawrence is new to our office and has not had diabetes education for about 15 years. She was diagnosed with diabetes in 2007, 21 years ago. She lives with her son who helps her with cooking and activities of daily living. He works second shift and wakes her when he gets home to let her know he is there an to be sure she is okay.  She skips her Humalog at lunch because she has "gone low too many times". She reports taking Humalog by sliding scale from 5-15 units, but could not tell me the ranges and stated she often takes 5 units for breakfast and 10 units for supper. CGM is okay per Dr. Johnney Ou and patient is interested in having a professional one placed today. Her son assisted hr with downloading the Dexcom G6 app on her phone. Plan is to trial the dexcom after professional and consider personal CGM. Her goal is to have her breakfast readings a bit lower in the 100s rather than 150-180.   Documentation for Freestyle Libre Pro Continuous glucose monitoring Freestyle Libre Pro CGM sensor placed today. Patient was educated about wearing sensor, keeping food, activity and medication log and when to call office. Patient was educated about how to care for the sensor and not to have an MRI, CT or Diathermy while wearing the sensor. Follow up was arranged with the patient for 1 week.   Lot #: [id number] Serial #: [id number] Expiration Date: 08/28/21  Preferred Learning Style:No preference indicated  Learning Readiness: contemplating on some things and  Ready in other areas  ANTHROPOMETRICS: Estimated body mass index is 41.08 kg/m as calculated from the following:   Height as of 05/23/21: _0  (1.626 m).   Weight as of 05/23/21: 239 lb 4.8 oz (108.5 kg).  WEIGHT HISTORY:  Wt Readings from Last 10 Encounters:  05/23/21 239 lb 4.8 oz (108.5  kg)  05/07/21 234 lb (106.1 kg)  04/23/21 235 lb (106.6 kg)  04/11/21 239 lb 6.7 oz (108.6 kg)    SLEEP:has trouble sleeping because of sleep apnea (she does not use CPAP) and also gets up many times to urinate  MEDICATIONS: 55 tresiba at bedtime ~ 11 PM, Humalog 5 units with breakfast and 10 units with supper, jardiance 10 mg BLOOD SUGAR: METER DOWNLOAD  Report summary is from last 30 days,  Average tests per day: 1.8 Average blood glucose: 163 Range: minimum: 70 and maximum: 272 Days without test: 0 % in target range: 68.5 % below target range: 0 % above target range: 31.5 % hypoglycemia: 0 Notes about patterns: checks before breakfast and supper  DIETARY INTAKE: Usual eating pattern includes  3 meals and 2 snacks per day. Avoided foods- fried foods, sweets   Constipation: does report problems for which she used to take something and now drink apple juice Dining Out (times/week):need to assess at future visit 24-hr recall:  ( 6-7AM): black coffee( 1 cup before checking blood sugar) then 2 more before Breakfast at 1030 AM- ~ 1c. cold cereal  w/ banana 3 days/week and toast x2 w/ sausage or bacon and sometimes egg 3-4 days/week Snk : nabs occasionally  L ( 3 PM): bologna or peanut buttr sandwich, 12 oz apple juice, honey grahams D ( 7 PM):  pork,chicken, fish hamburger all baked. Last night, pork chop sandwich, ~ 1/2 c  mac & Cheese,  Snk ( PM): peanut buttr sandwich Beverages: 12 oz apple juice day, water, black coffee x 3 c/day  Usual physical activity: limited to ADLs and some walking, she says she cannot stand for too long  Progress Towards Goal(s):  In progress.   Nutritional Diagnosis:  NB-1.1 Food and nutrition-related knowledge deficit As related to lack of recent diabetes training.  As evidenced by her report. .    Intervention:  Nutrition education about lower carb food options and  CGM.  Action Goal: wear CGM and keep food and insulin records  Outcome goal:   Coordination of care: patient would like a referral to GI, to use Humalog pens instead of vials and a prescription for pen needles  Teaching Method Utilized: Visual, Auditory,Hands on Handouts given during visit include:folder for CGM with food record,  After visit summary Barriers to learning/adherence to lifestyle change: competing values Demonstrated degree of understanding via:  Teach Back   Monitoring/Evaluation:  Dietary intake, exercise, meter, and body weight in 1 week(s) with me and a doctor Debera Lat, RD 06/19/2021 11:52 AM. .

## 2021-06-19 NOTE — Telephone Encounter (Signed)
patient would like a referral to GI, to use Humalog pens instead of vials and a prescription for pen needles

## 2021-06-25 ENCOUNTER — Ambulatory Visit: Payer: 59 | Admitting: Cardiovascular Disease

## 2021-06-26 ENCOUNTER — Encounter: Payer: 59 | Admitting: Dietician

## 2021-06-26 ENCOUNTER — Encounter: Payer: 59 | Admitting: Internal Medicine

## 2021-06-28 ENCOUNTER — Encounter: Payer: Self-pay | Admitting: Internal Medicine

## 2021-07-02 ENCOUNTER — Other Ambulatory Visit: Payer: Self-pay | Admitting: Student

## 2021-07-02 ENCOUNTER — Encounter: Payer: 59 | Admitting: Student

## 2021-07-02 ENCOUNTER — Telehealth: Payer: Self-pay | Admitting: *Deleted

## 2021-07-02 ENCOUNTER — Encounter: Payer: 59 | Admitting: Dietician

## 2021-07-02 DIAGNOSIS — E119 Type 2 diabetes mellitus without complications: Secondary | ICD-10-CM

## 2021-07-02 DIAGNOSIS — I509 Heart failure, unspecified: Secondary | ICD-10-CM

## 2021-07-02 NOTE — Telephone Encounter (Signed)
CALLED AND SPOKE WITH PATIENT REGARDING HER APPOINTMENT TODAY THAT SHE MISSED. PATIENT THOUGHT HER APPOINTMENT WAS FOR TOMORROW 7/6. PATIENT WAS INSTRUCTED TO CALL THE [phone] NUMBER TO RESCHEDULE APPOINTMENT.

## 2021-07-03 ENCOUNTER — Encounter: Payer: 59 | Admitting: Dietician

## 2021-07-03 ENCOUNTER — Other Ambulatory Visit: Payer: Self-pay | Admitting: Student

## 2021-07-09 ENCOUNTER — Encounter: Payer: Self-pay | Admitting: Pulmonary Disease

## 2021-07-09 ENCOUNTER — Telehealth: Payer: Self-pay | Admitting: *Deleted

## 2021-07-09 ENCOUNTER — Other Ambulatory Visit: Payer: Self-pay

## 2021-07-09 ENCOUNTER — Ambulatory Visit (INDEPENDENT_AMBULATORY_CARE_PROVIDER_SITE_OTHER): Payer: 59 | Admitting: Pulmonary Disease

## 2021-07-09 VITALS — BP 130/74 | HR 79 | Temp 98.4°F | Ht 64.0 in | Wt 231.0 lb

## 2021-07-09 DIAGNOSIS — F17200 Nicotine dependence, unspecified, uncomplicated: Secondary | ICD-10-CM

## 2021-07-09 DIAGNOSIS — J441 Chronic obstructive pulmonary disease with (acute) exacerbation: Secondary | ICD-10-CM | POA: Diagnosis not present

## 2021-07-09 MED ORDER — IPRATROPIUM-ALBUTEROL 0.5-2.5 (3) MG/3ML IN SOLN: 3.0000 mL | Freq: Four times a day (QID) | RESPIRATORY_TRACT | 3 refills | Status: DC

## 2021-07-09 MED ORDER — PREDNISONE 10 MG PO TABS: 30.0000 mg | ORAL_TABLET | Freq: Every day | ORAL | 0 refills | Status: AC

## 2021-07-09 MED ORDER — TRELEGY ELLIPTA 200-62.5-25 MCG/INH IN AEPB: 1.0000 | INHALATION_SPRAY | Freq: Every day | RESPIRATORY_TRACT | 5 refills | Status: DC

## 2021-07-09 MED ORDER — TRELEGY ELLIPTA 200-62.5-25 MCG/INH IN AEPB: 1.0000 | INHALATION_SPRAY | Freq: Every day | RESPIRATORY_TRACT | 0 refills | Status: DC

## 2021-07-09 NOTE — Progress Notes (Signed)
Subjective:   PATIENT ID: Caitlyn Lawrence GENDER: female DOB: 07-02-1946, MRN: [id number]   HPI  Chief Complaint  Patient presents with   Consult    Pt was hospitalized 4/11-4/14 and referral was made after hospital stay.  Pt does have complaints of SOB as well as having a cough which she will cough up white phlegm.    Reason for Visit: New consult for COPD  Ms. Darcia Lampi is a 75 year old female active smoker with COPD, atrial fibrillation, DM2, s/p CABG in 2005 who presents as a new consult for COPD.  She was referred by her PCP Dr. Johnney Ou. Clinic note from 05/23/21 reviewed. She is currently on Combivent, ellipta, albuterol and duonebs. She moved from [place], [place] to [place], [place] in 03/2021 and has worsening COPD. She was previously seen by Pulmonology in her hometown for at least 10 years.  At baseline, she has expiratory wheezing, shortness of breath at rest and exertion and chronic cough throughout the day. She is on Breo daily, combivent one puff twice a day, and albuterol nebulizer three times a day. Her activity is limited due to her symptoms. Her last exacerbation requiring steroids was spring 2021. Has not been hospitalized for her COPD. Symptoms are triggered by activity, pollen, heat. Cold air improves it.   She performs ADLs including showering on her own but she has to pace herself and can be difficult at times. She reports that she has gradually slowed down in activity in the last few years. Her son assists with laundry, cooking, housework. She moved in with her son three months ago in [place].  She is an active smoker and using 1/2-1ppd. She is trying to eliminate. Tried patch for one day and self discontinued.   Social History: Active smoker. 60 pack-years  I have personally reviewed patient's past medical/family/social history, allergies, current medications  Past Medical History:  Diagnosis Date   Angiectasia    of colon and  duodenum   CAD (coronary artery disease)    Candidal esophagitis (HCC)    COPD with acute exacerbation (HCC)    Diabetes ([place])    Diverticulosis    GI bleed    H. pylori infection 10/2017   Heart failure (HCC)    Hypertension    IDA (iron deficiency anemia)    Internal hemorrhoids    MI (myocardial infarction) ([place])    Osteoarthritis    Pancreatic steatorrhea    Paroxysmal atrial fibrillation (HCC)    Tubular adenoma of colon      Family History  Problem Relation Age of Onset   Aneurysm Mother    Uterine cancer Mother    Lung cancer Father    Diabetes Sister    Hypertension Sister    Ovarian cancer Daughter    Uterine cancer Maternal Aunt    Lung cancer Paternal Aunt      Social History   Occupational History   Not on file  Tobacco Use   Smoking status: Every Day    Packs/day: 1.00    Years: 60.00    Pack years: 60.00    Types: Cigarettes   Smokeless tobacco: Former   Tobacco comments:    Currently smoking 0.5ppd as of 07/09/21  Substance and Sexual Activity   Alcohol use: Not Currently   Drug use: Never   Sexual activity: Not on file    Allergies  Allergen Reactions   Penicillins Hives and Itching   Sulfa Antibiotics Other (See Comments)  Pt does not like how it makes her feel.      Outpatient Medications Prior to Visit  Medication Sig Dispense Refill   albuterol (ACCUNEB) 0.63 MG/3ML nebulizer solution Take 3 mLs (0.63 mg total) by nebulization every 6 (six) hours as needed for wheezing or shortness of breath. 75 mL 3   Blood Glucose Monitoring Suppl (ONETOUCH VERIO) w/Device KIT 1 Units by Does not apply route 3 (three) times daily. 1 kit 0   ferrous sulfate 325 (65 FE) MG tablet Take 325 mg by mouth daily with breakfast.     gabapentin (NEURONTIN) 100 MG capsule Take 200 mg by mouth 3 (three) times daily.     glucose blood (ONETOUCH VERIO) test strip 1 each by Other route 3 (three) times daily. Use as instructed 100 each 12   insulin degludec  (TRESIBA FLEXTOUCH) 100 UNIT/ML FlexTouch Pen Inject 54 Units into the skin at bedtime. 3 mL 5   insulin lispro (HUMALOG KWIKPEN) 100 UNIT/ML KwikPen Inject 15 Units into the skin 3 (three) times daily with meals. 15 mL 11   Insulin Pen Needle 32G X 4 MM MISC Use to inject insulin 4 times a day 360 each 3   Insulin Syringe-Needle U-100 31G X 15/64" 0.5 ML MISC Please use with insulin 100 each 3   Ipratropium-Albuterol (COMBIVENT RESPIMAT) 20-100 MCG/ACT AERS respimat Inhale 1 puff into the lungs 4 (four) times daily.     JARDIANCE 10 MG TABS tablet TAKE 1 TABLET BY MOUTH ONCE DAILY BEFORE BREAKFAST 90 tablet 0   Lancets MISC 1 Units by Does not apply route in the morning, at noon, and at bedtime. 100 each 2   losartan (COZAAR) 100 MG tablet Take 100 mg by mouth daily.     lovastatin (MEVACOR) 20 MG tablet Take 20 mg by mouth every evening.     metoprolol succinate (TOPROL-XL) 100 MG 24 hr tablet Take 1 tablet (100 mg total) by mouth daily. Take with or immediately following a meal. 90 tablet 3   OneTouch Delica Lancets 57W MISC Please check your sugars 4x daily. 100 each 3   polyethylene glycol (MIRALAX / GLYCOLAX) 17 g packet Take 17 g by mouth as needed for mild constipation. 30 each 3   rivaroxaban (XARELTO) 20 MG TABS tablet Take 1 tablet (20 mg total) by mouth every morning. 30 tablet 0   Vitamin D, Ergocalciferol, (DRISDOL) 1.25 MG (50000 UNIT) CAPS capsule Take 50,000 Units by mouth every 7 (seven) days. Mondays     ANORO ELLIPTA 62.5-25 MCG/INH AEPB Inhale 1 puff by mouth once daily 60 each 0   fluconazole (DIFLUCAN) 200 MG tablet Take 1 tablet (200 mg total) by mouth daily. 13 tablet 0   ipratropium-albuterol (DUONEB) 0.5-2.5 (3) MG/3ML SOLN Take 3 mLs by nebulization 4 (four) times daily. 360 mL 3   No facility-administered medications prior to visit.    Review of Systems  Constitutional:  Negative for chills, diaphoresis, fever, malaise/fatigue and weight loss.  HENT:  Positive for  congestion. Negative for ear pain and sore throat.   Respiratory:  Positive for cough, sputum production, shortness of breath and wheezing. Negative for hemoptysis.   Cardiovascular:  Negative for chest pain, palpitations and leg swelling.  Gastrointestinal:  Negative for abdominal pain, heartburn and nausea.  Genitourinary:  Negative for frequency.  Musculoskeletal:  Negative for joint pain and myalgias.  Skin:  Negative for itching and rash.  Neurological:  Positive for headaches. Negative for dizziness and weakness.  Endo/Heme/Allergies:  Does not bruise/bleed easily.  Psychiatric/Behavioral:  Negative for depression. The patient is not nervous/anxious.     Objective:   Vitals:   07/09/21 1121  BP: 130/74  Pulse: 79  Temp: 98.4 F (36.9 C)  TempSrc: Oral  SpO2: 96%  Weight: 231 lb (104.8 kg)  Height: 5' 4"  (1.626 m)   SpO2: 96 % (RA) O2 Device: None (Room air)  Body mass index is 39.65 kg/m.  Physical Exam: General: Well-appearing, no acute distress HENT: [place], AT, OP clear, MMM Eyes: EOMI, no scleral icterus Respiratory: Expiratory wheeze bilaterally.  Cardiovascular: RRR, -M/R/G, no JVD Extremities:-Edema,-tenderness Neuro: AAO x4, CNII-XII grossly intact Skin: Intact, no rashes or bruising Psych: Normal mood, normal affect  Data Reviewed:  Imaging: CT Chest 08/08/1999 (report only) - Mild paraseptal emphysema  PFT: None on file  Labs: CBC    Component Value Date/Time   WBC 8.1 06/10/2021 1240   RBC 5.02 06/10/2021 1240   HGB 13.9 06/10/2021 1240   HGB 11.9 04/23/2021 1512   HCT 44.5 06/10/2021 1240   HCT 39.4 04/23/2021 1512   PLT 329 06/10/2021 1240   PLT 301 04/23/2021 1512   MCV 88.6 06/10/2021 1240   MCV 87 04/23/2021 1512   MCH 27.7 06/10/2021 1240   MCHC 31.2 06/10/2021 1240   RDW 16.3 (H) 06/10/2021 1240   RDW 18.0 (H) 04/23/2021 1512   LYMPHSABS 0.7 04/08/2021 0925   MONOABS 0.6 04/08/2021 0925   EOSABS 0.2 04/08/2021 0925   BASOSABS  0.1 04/08/2021 0925   Absolute eosinophils 04/08/21 - 200  Imaging, labs and test noted above have been reviewed independently by me.     Assessment & Plan:   Discussion: 75 year old female active smoker with COPD, atrial fibrillation, DM2, s/p CABG in 2005 who presents with COPD exacerbation. We reviewed her history and discussed inhaler regimen. Discussed management including goal to minimize symptoms and exacerbations. Counseled on tobacco cessation.  COPD with emphysema --STOP Anoro and STOP Breo --START Trelegy 200-62.5-25 mcg ONE puff ONCE a day --START Duonebs nebulizer treatment 2-4 times a day for shortness of breath or wheezing --CONTINUE Combivent ONE puff as needed for shortness of breath or wheezing --Refer to Lung Screening  COPD exacerbation --START prednisone 30 mg x 5 days (lower dose due to hx hyperglycemia)  Tobacco abuse Patient is an active smoker. We discussed smoking cessation for 5 minutes. We discussed triggers and stressors and ways to deal with them. We discussed barriers to continued smoking and benefits of smoking cessation. Provided patient with information cessation techniques and interventions including [place] quitline.  Health Maintenance Immunization History  Administered Date(s) Administered   Influenza, High Dose Seasonal PF 10/12/2012, 11/14/2013, 11/20/2014, 11/13/2015, 10/18/2016, 09/09/2017, 09/29/2018, 10/26/2019, 10/18/2020   Moderna Sars-Covid-2 Vaccination 01/19/2020, 03/05/2020, 11/09/2020   Pneumococcal Conjugate-13 09/09/2017   Pneumococcal Polysaccharide-23 10/19/2019   CT Lung Screen - Refer  Orders Placed This Encounter  Procedures   Ambulatory Referral for Lung Cancer Scre    Referral Priority:   Routine    Referral Type:   Consultation    Referral Reason:   Specialty Services Required    Number of Visits Requested:   1   Meds ordered this encounter  Medications   ipratropium-albuterol (DUONEB) 0.5-2.5 (3) MG/3ML SOLN     Sig: Take 3 mLs by nebulization 4 (four) times daily.    Dispense:  360 mL    Refill:  3   Fluticasone-Umeclidin-Vilant (TRELEGY ELLIPTA) 200-62.5-25 MCG/INH AEPB  Sig: Inhale 1 puff into the lungs daily.    Dispense:  60 each    Refill:  5   predniSONE (DELTASONE) 10 MG tablet    Sig: Take 3 tablets (30 mg total) by mouth daily with breakfast for 5 days.    Dispense:  15 tablet    Refill:  0   Fluticasone-Umeclidin-Vilant (TRELEGY ELLIPTA) 200-62.5-25 MCG/INH AEPB    Sig: Inhale 1 puff into the lungs daily.    Dispense:  14 each    Refill:  0    Order Specific Question:   Lot Number?    Answer:   [id number]    Order Specific Question:   Expiration Date?    Answer:   09/28/2022    Order Specific Question:   Manufacturer?    Answer:   [employer] [[id number]]    Order Specific Question:   Quantity    Answer:   1    Return in about 2 months (around 09/09/2021).  I have spent a total time of 45-minutes on the day of the appointment reviewing prior documentation, coordinating care and discussing medical diagnosis and plan with the patient/family. Imaging, labs and tests included in this note have been reviewed and interpreted independently by me.  [place], MD [facility] 07/09/2021 12:08 PM  Office Number [phone]

## 2021-07-09 NOTE — Telephone Encounter (Signed)
Called pt- stated she needs refill on Humalog. Informed pt she has refills, stated she did not get it. Informed pt she needs to call the pharmacy everytime she starts to run low. Voiced understanding.

## 2021-07-09 NOTE — Patient Instructions (Addendum)
  COPD with emphysema in active exacerbation --STOP Anoro and STOP Breo --START Trelegy 200-62.5-25 mcg ONE puff ONCE a day --START Duonebs nebulizer treatment 2-4 times a day for shortness of breath or wheezing --CONTINUE Combivent ONE puff as needed for shortness of breath or wheezing --Refer to Lung Screening  COPD exacerbation --START prednisone 30 mg x 5 days   Follow-up with me in 2 months

## 2021-07-09 NOTE — Telephone Encounter (Signed)
Patient requesting refill on her Insulin. Dont have enough to last until appointment on Monday.

## 2021-07-15 ENCOUNTER — Ambulatory Visit (INDEPENDENT_AMBULATORY_CARE_PROVIDER_SITE_OTHER): Payer: 59 | Admitting: Student

## 2021-07-15 ENCOUNTER — Encounter: Payer: Self-pay | Admitting: Student

## 2021-07-15 ENCOUNTER — Other Ambulatory Visit: Payer: Self-pay

## 2021-07-15 ENCOUNTER — Encounter: Payer: Self-pay | Admitting: Dietician

## 2021-07-15 ENCOUNTER — Ambulatory Visit (INDEPENDENT_AMBULATORY_CARE_PROVIDER_SITE_OTHER): Payer: 59 | Admitting: Dietician

## 2021-07-15 VITALS — BP 145/61 | HR 74 | Temp 98.3°F | Ht 64.0 in | Wt 233.1 lb

## 2021-07-15 DIAGNOSIS — E119 Type 2 diabetes mellitus without complications: Secondary | ICD-10-CM

## 2021-07-15 DIAGNOSIS — Z8719 Personal history of other diseases of the digestive system: Secondary | ICD-10-CM | POA: Diagnosis not present

## 2021-07-15 DIAGNOSIS — I159 Secondary hypertension, unspecified: Secondary | ICD-10-CM | POA: Diagnosis not present

## 2021-07-15 DIAGNOSIS — Z794 Long term (current) use of insulin: Secondary | ICD-10-CM

## 2021-07-15 DIAGNOSIS — J441 Chronic obstructive pulmonary disease with (acute) exacerbation: Secondary | ICD-10-CM

## 2021-07-15 LAB — POCT GLYCOSYLATED HEMOGLOBIN (HGB A1C): Hemoglobin A1C: 6.2 % — AB (ref 4.0–5.6)

## 2021-07-15 LAB — GLUCOSE, CAPILLARY: Glucose-Capillary: 99 mg/dL (ref 70–99)

## 2021-07-15 MED ORDER — INSULIN LISPRO (1 UNIT DIAL) 100 UNIT/ML (KWIKPEN): PEN_INJECTOR | SUBCUTANEOUS | 11 refills | Status: DC

## 2021-07-15 MED ORDER — TRESIBA FLEXTOUCH 100 UNIT/ML ~~LOC~~ SOPN: 50.0000 [IU] | PEN_INJECTOR | Freq: Every day | SUBCUTANEOUS | 5 refills | Status: DC

## 2021-07-15 NOTE — Assessment & Plan Note (Addendum)
Assessment: Patient with history of GI bleed found to have ectasias on former endoscopy and colonoscopy.  She is continued on Xarelto for history of A. Fib.  She was sent to the ED by GI last month for dark tarry stools.  Patient did not end up being evaluated as the wait time was too long.  She did have a CBC done with a normal hemoglobin.  We will repeat her hemoglobin study as well as add on iron studies today.  Discussed with patient that she needs to call GI and have a follow-up appointment with them for further evaluation.  Plan: -CBC, iron studies pending -Follow-up with GI -Return precautions for dark tarry stools or suspected bleed  Addendum:  Hgb normal. Iron studies with elevated iron levels. Request patient she hold iron supplements until follow up visit in a month. Will repeat levels at this time.

## 2021-07-15 NOTE — Progress Notes (Signed)
CC: Follow-up hyperglycemia, COPD  HPI:  [name] is a 75 y.o. female with a past medical history stated below and presents today for follow-up concerning diabetes, COPD. Please see problem based assessment and plan for additional details.  Past Medical History:  Diagnosis Date   Angiectasia    of colon and duodenum   CAD (coronary artery disease)    Candidal esophagitis (HCC)    COPD with acute exacerbation ([place])    Diabetes ([place])    Diverticulosis    GI bleed    H. pylori infection 10/2017   Heart failure (HCC)    Hypertension    IDA (iron deficiency anemia)    Internal hemorrhoids    MI (myocardial infarction) ([place])    Osteoarthritis    Pancreatic steatorrhea    Paroxysmal atrial fibrillation (HCC)    Tubular adenoma of colon     Current Outpatient Medications on File Prior to Visit  Medication Sig Dispense Refill   albuterol (ACCUNEB) 0.63 MG/3ML nebulizer solution Take 3 mLs (0.63 mg total) by nebulization every 6 (six) hours as needed for wheezing or shortness of breath. 75 mL 3   Blood Glucose Monitoring Suppl (ONETOUCH VERIO) w/Device KIT 1 Units by Does not apply route 3 (three) times daily. 1 kit 0   ferrous sulfate 325 (65 FE) MG tablet Take 325 mg by mouth daily with breakfast.     Fluticasone-Umeclidin-Vilant (TRELEGY ELLIPTA) 200-62.5-25 MCG/INH AEPB Inhale 1 puff into the lungs daily. 60 each 5   Fluticasone-Umeclidin-Vilant (TRELEGY ELLIPTA) 200-62.5-25 MCG/INH AEPB Inhale 1 puff into the lungs daily. 14 each 0   gabapentin (NEURONTIN) 100 MG capsule Take 200 mg by mouth 3 (three) times daily.     glucose blood (ONETOUCH VERIO) test strip 1 each by Other route 3 (three) times daily. Use as instructed 100 each 12   Insulin Pen Needle 32G X 4 MM MISC Use to inject insulin 4 times a day 360 each 3   Insulin Syringe-Needle U-100 31G X 15/64" 0.5 ML MISC Please use with insulin 100 each 3   Ipratropium-Albuterol (COMBIVENT RESPIMAT) 20-100 MCG/ACT  AERS respimat Inhale 1 puff into the lungs 4 (four) times daily.     ipratropium-albuterol (DUONEB) 0.5-2.5 (3) MG/3ML SOLN Take 3 mLs by nebulization 4 (four) times daily. 360 mL 3   JARDIANCE 10 MG TABS tablet TAKE 1 TABLET BY MOUTH ONCE DAILY BEFORE BREAKFAST 90 tablet 0   Lancets MISC 1 Units by Does not apply route in the morning, at noon, and at bedtime. 100 each 2   losartan (COZAAR) 100 MG tablet Take 100 mg by mouth daily.     lovastatin (MEVACOR) 20 MG tablet Take 20 mg by mouth every evening.     metoprolol succinate (TOPROL-XL) 100 MG 24 hr tablet Take 1 tablet (100 mg total) by mouth daily. Take with or immediately following a meal. 90 tablet 3   OneTouch Delica Lancets 48N MISC Please check your sugars 4x daily. 100 each 3   polyethylene glycol (MIRALAX / GLYCOLAX) 17 g packet Take 17 g by mouth as needed for mild constipation. 30 each 3   rivaroxaban (XARELTO) 20 MG TABS tablet Take 1 tablet (20 mg total) by mouth every morning. 30 tablet 0   Vitamin D, Ergocalciferol, (DRISDOL) 1.25 MG (50000 UNIT) CAPS capsule Take 50,000 Units by mouth every 7 (seven) days. Mondays     No current facility-administered medications on file prior to visit.    Family History  Problem Relation Age of Onset   Aneurysm Mother    Uterine cancer Mother    Lung cancer Father    Diabetes Sister    Hypertension Sister    Ovarian cancer Daughter    Uterine cancer Maternal Aunt    Lung cancer Paternal Aunt     Social History   Socioeconomic History   Marital status: Single    Spouse name: Not on file   Number of children: Not on file   Years of education: Not on file   Highest education level: Not on file  Occupational History   Not on file  Tobacco Use   Smoking status: Every Day    Packs/day: 1.00    Years: 60.00    Pack years: 60.00    Types: Cigarettes   Smokeless tobacco: Former   Tobacco comments:    Currently smoking 0.5ppd as of 07/09/21  Substance and Sexual Activity    Alcohol use: Not Currently   Drug use: Never   Sexual activity: Not on file  Other Topics Concern   Not on file  Social History Narrative   Not on file   Social Determinants of Health   Financial Resource Strain: Not on file  Food Insecurity: Not on file  Transportation Needs: Not on file  Physical Activity: Not on file  Stress: Not on file  Social Connections: Not on file  Intimate Partner Violence: Not on file   Review of Systems: ROS negative except for what is noted on the assessment and plan.  Vitals:   07/15/21 0923 07/15/21 0932  BP: (!) 149/68 (!) 145/61  Pulse: 76 74  Temp: 98.3 F (36.8 C)   TempSrc: Oral   SpO2: 95%   Weight: 233 lb 1.6 oz (105.7 kg)   Height: _0  (1.626 m)    Physical Exam: Constitutional: Well-appearing no acute distress HENT: normocephalic atraumatic Eyes: conjunctiva non-erythematous Neck: supple Cardiovascular: regular rate and rhythm, no m/r/g Pulmonary/Chest: normal work of breathing on room air, lungs clear to auscultation bilaterally MSK: normal bulk and tone Neurological: alert & oriented x 3 Skin: warm and dry Psych: Normal mood and thought process   Assessment & Plan:   See Encounters Tab for problem based charting.  Patient discussed with Dr. Caffie Damme, D.O. [facility], PGY-2 Pager: [phone], Phone: [phone] Date 07/15/2021 Time 11:27 AM

## 2021-07-15 NOTE — Assessment & Plan Note (Signed)
Assessment: Blood pressure reading today 145/61.  Current regimen of losartan 100 mg daily and metoprolol 100 mg daily.  Request patient check her blood pressures at home and bring in recordings next visit.  We discussed proper ways to check blood pressure.  Plan: -Continue losartan 100 mg daily and metoprolol 100 mg daily -Consider addition of amlodipine at next visit if blood pressure still elevated

## 2021-07-15 NOTE — Assessment & Plan Note (Signed)
Assessment: Patient endorses significant improvement of her breathing since being evaluated by PCCM.  She states that her inhalers were adjusted.  She states that she can walk without feeling short of breath.  Current regimen of Trelegy Ellipta 1 puff daily, albuterol every 6 hours as needed, DuoNeb 3 mils 4 times daily.  Patient also requesting a new DuoNeb machine.  Will place DME order.  Plan: -Continue regimen as per above -Continue to follow with PCCM -DuoNeb machine ordered

## 2021-07-15 NOTE — Patient Instructions (Signed)
Thank you, [name] for allowing [place] to provide your care today. Today we discussed  Diabetes We will be downloading your glucose readings. Because you have had a few low blood sugars, we will be making the specific change below  Lowering your tresiba to 50 units  You will also meet with Butch Penny to discuss your diabetes further.   Blood Pressure  Your blood pressure was elevated today. Please check your blood pressures at home, keep a log, and bring this back to [place] during your next appointment. If still elevated, we may add a blood pressure medication.   GI Bleed Please call the GI doctors and schedule a follow up appointment. You can tell them we did blood work at your appointment today to check your hemoglobin and iron levels.   If you have any other episodes of dark stools, please call our office or go to the closest emergency department.   I have ordered the following labs for you:   Lab Orders  CBC no Diff  Iron and IBC ([id number])  Ferritin  POC Hbg A1C     Referrals ordered today:   Referral Orders  No referral(s) requested today     I have ordered the following medication/changed the following medications:   Stop the following medications: Medications Discontinued During This Encounter  Medication Reason   insulin lispro (HUMALOG KWIKPEN) 100 UNIT/ML KwikPen Reorder   insulin degludec (TRESIBA FLEXTOUCH) 100 UNIT/ML FlexTouch Pen      Start the following medications: Meds ordered this encounter  Medications   insulin lispro (HUMALOG KWIKPEN) 100 UNIT/ML KwikPen    Sig: Inject 15 Units into the skin 3 (three) times daily with meals.    Dispense:  15 mL    Refill:  11   insulin degludec (TRESIBA FLEXTOUCH) 100 UNIT/ML FlexTouch Pen    Sig: Inject 50 Units into the skin at bedtime.    Dispense:  3 mL    Refill:  5     Follow up: 1 month to check diabetes and high blood pressure    Should you have any questions or concerns please call the  internal medicine clinic at [phone].     Sanjuana Letters, D.O. [place]

## 2021-07-15 NOTE — Assessment & Plan Note (Signed)
Assessment: A1c today of 6.2 down from 6.3.  Current regimen of Tresiba 54 units daily, Humalog 15 units 3 times daily and Jardiance 10 mg daily.  On CBG review patient has had 3 low glucose readings throughout the day, in the low 70s.  Plan to decrease her Tresiba to 50 units daily.  Patient is doing well on current regimen however I do feel as though she would benefit from a GLP-1.  This would also have cardiac benefits as well as weight loss benefits.  This could also allow [place] to decrease the amount of insulin she is taking daily.  Will consider at next visit.  Plan: -Continue current regimen of Humalog 15 units 3 times daily with meals, Jardiance 10 mg daily -Decrease Tresiba to 50 units daily -Repeat A1c in 3 months next -Follow-up CBG readings in 1 month, patient has meeting with Butch Penny tomorrow for placement and instructions on how to use.

## 2021-07-15 NOTE — Progress Notes (Signed)
  Medical Nutrition Therapy:  Appt start time: 1517 end time:  1126. Total time:21 minutes Visit # 2  Assessment:  Primary concerns today: diabetes education Ms. Peel-Cartaya is here with her son for a diabetes MNT follow up visit. She has no questions about meal planning today. She states " My son changed his cooking and is no longer seasoning foods like he was."  She threw the professional cgm sensor away because she was distraught over a death in her family. She says she has a box with [employer] reader and sensors at home that she does not know how to use. Her son was unable to download and sign on to the [facility] app. She would be best using a reader. Ms. Couts says she drinks sprite and was amazed how much sugar is in it.  She scheduled an appointment for tomorrow morning to learn how to use her personal CGM   Her goal is to have her breakfast readings a bit lower in the 100s rather than 150-180.   ANTHROPOMETRICS: Estimated body mass index is 40.01 kg/m as calculated from the following:   Height as of an earlier encounter on 07/15/21: 5\' 4"  (1.626 m).   Weight as of an earlier encounter on 07/15/21: 233 lb 1.6 oz (105.7 kg).  WEIGHT HISTORY:  Wt Readings from Last 10 Encounters:  07/15/21 233 lb 1.6 oz (105.7 kg)  07/09/21 231 lb (104.8 kg)  05/23/21 239 lb 4.8 oz (108.5 kg)  05/07/21 234 lb (106.1 kg)  04/23/21 235 lb (106.6 kg)  04/11/21 239 lb 6.7 oz (108.6 kg)    SLEEP:has trouble sleeping because of sleep apnea (she does not use CPAP) and also gets up many times to urinate  MEDICATIONS: per Ms Peeleclark  her Tyler Aas was decreased to 50units at bedtime today, Humalog 5 units with breakfast and 10 units with supper, jardiance 10 mg BLOOD SUGAR: METER DOWNLOAD reviewed briefly today   DIETARY INTAKE: not done in detail today  PHYSICAL ACTIVITY: limited to ADLs and some walking, she says she cannot stand for too long  Progress Towards Goal(s):  In  progress.   Nutritional Diagnosis:  NB-1.1 Food and nutrition-related knowledge deficit As related to lack of recent diabetes training.  As evidenced by her report. .    Intervention:  Nutrition education about personal  CGMs, what makes blood sugar go up and down.    Outcome goal: improved self monitoring and diabetes self management knowledge Coordination of care: patient would like Humalog pens- is getting needles and no pens  Teaching Method Utilized: Visual, Auditory,Hands on Handouts given during visit include:folder for CGM with food record,  After visit summary Barriers to learning/adherence to lifestyle change: competing values Demonstrated degree of understanding via:  Teach Back   Monitoring/Evaluation:  Dietary intake, exercise, meter, and body weight in 1 day(s) with me and a doctor Debera Lat, RD 07/15/2021 1:53 PM. .

## 2021-07-15 NOTE — Patient Instructions (Signed)
Bring your box with th [employer] CGM tomorrow.   Butch Penny [phone]

## 2021-07-16 ENCOUNTER — Ambulatory Visit: Payer: 59 | Admitting: Dietician

## 2021-07-16 ENCOUNTER — Other Ambulatory Visit: Payer: Self-pay

## 2021-07-16 ENCOUNTER — Telehealth: Payer: Self-pay | Admitting: *Deleted

## 2021-07-16 LAB — CBC
Hematocrit: 42.6 % (ref 34.0–46.6)
Hemoglobin: 13.2 g/dL (ref 11.1–15.9)
MCH: 27.7 pg (ref 26.6–33.0)
MCHC: 31 g/dL — ABNORMAL LOW (ref 31.5–35.7)
MCV: 89 fL (ref 79–97)
Platelets: 337 10*3/uL (ref 150–450)
RBC: 4.77 x10E6/uL (ref 3.77–5.28)
RDW: 13.6 % (ref 11.7–15.4)
WBC: 12.1 10*3/uL — ABNORMAL HIGH (ref 3.4–10.8)

## 2021-07-16 LAB — IRON AND TIBC
Iron Saturation: 84 % (ref 15–55)
Iron: 334 ug/dL (ref 27–139)
Total Iron Binding Capacity: 400 ug/dL (ref 250–450)
UIBC: 66 ug/dL — ABNORMAL LOW (ref 118–369)

## 2021-07-16 LAB — FERRITIN: Ferritin: 106 ng/mL (ref 15–150)

## 2021-07-16 MED ORDER — IPRATROPIUM-ALBUTEROL 0.5-2.5 (3) MG/3ML IN SOLN
3.0000 mL | Freq: Four times a day (QID) | RESPIRATORY_TRACT | 3 refills | Status: DC
Start: 2021-07-16 — End: 2021-09-09

## 2021-07-16 NOTE — Addendum Note (Signed)
Addended by: Riesa Pope on: 07/16/2021 01:47 PM   Modules accepted: Orders

## 2021-07-16 NOTE — Telephone Encounter (Signed)
CM sent to Stephannie Peters at [facility] for Nebulizer and supplies. F2F was 07/15/21.

## 2021-07-16 NOTE — Telephone Encounter (Signed)
Carola Rhine, RN; Seward Carol, Bradley;Garcia, Mardene Celeste  Received! Thanks!

## 2021-07-17 ENCOUNTER — Other Ambulatory Visit: Payer: Self-pay | Admitting: *Deleted

## 2021-07-17 DIAGNOSIS — F17210 Nicotine dependence, cigarettes, uncomplicated: Secondary | ICD-10-CM

## 2021-07-17 DIAGNOSIS — Z87891 Personal history of nicotine dependence: Secondary | ICD-10-CM

## 2021-07-17 NOTE — Progress Notes (Signed)
Internal Medicine Clinic Attending  Case discussed with Dr. Katsadouros  At the time of the visit.  We reviewed the resident's history and exam and pertinent patient test results.  I agree with the assessment, diagnosis, and plan of care documented in the resident's note.  

## 2021-08-15 ENCOUNTER — Encounter: Payer: Self-pay | Admitting: Student

## 2021-08-15 ENCOUNTER — Ambulatory Visit (INDEPENDENT_AMBULATORY_CARE_PROVIDER_SITE_OTHER): Payer: 59 | Admitting: Student

## 2021-08-15 ENCOUNTER — Other Ambulatory Visit: Payer: Self-pay

## 2021-08-15 VITALS — BP 136/69 | HR 72 | Temp 98.0°F | Ht 64.0 in | Wt 233.0 lb

## 2021-08-15 DIAGNOSIS — I159 Secondary hypertension, unspecified: Secondary | ICD-10-CM

## 2021-08-15 DIAGNOSIS — Z8719 Personal history of other diseases of the digestive system: Secondary | ICD-10-CM | POA: Diagnosis not present

## 2021-08-15 DIAGNOSIS — Z0000 Encounter for general adult medical examination without abnormal findings: Secondary | ICD-10-CM | POA: Diagnosis not present

## 2021-08-15 DIAGNOSIS — Z794 Long term (current) use of insulin: Secondary | ICD-10-CM

## 2021-08-15 DIAGNOSIS — I480 Paroxysmal atrial fibrillation: Secondary | ICD-10-CM

## 2021-08-15 DIAGNOSIS — E119 Type 2 diabetes mellitus without complications: Secondary | ICD-10-CM | POA: Diagnosis not present

## 2021-08-15 DIAGNOSIS — L409 Psoriasis, unspecified: Secondary | ICD-10-CM | POA: Insufficient documentation

## 2021-08-15 DIAGNOSIS — D500 Iron deficiency anemia secondary to blood loss (chronic): Secondary | ICD-10-CM | POA: Diagnosis not present

## 2021-08-15 MED ORDER — CLOBETASOL PROPIONATE 0.05 % EX SHAM: 1.0000 "application " | MEDICATED_SHAMPOO | Freq: Every day | CUTANEOUS | 0 refills | Status: AC

## 2021-08-15 NOTE — Assessment & Plan Note (Signed)
Assessment: Blood pressure today 136/69.  Current regimen of losartan 100 mg daily and metoprolol 100 mg daily.  Goal  blood pressure under 140/80.  We will continue with current regimen  Plan: -Continue losartan 100 mg daily metoprolol 100 mg daily

## 2021-08-15 NOTE — Patient Instructions (Signed)
Thank you, Ms.Riely Raoul Pitch for allowing [place] to provide your care today. Today we discussed  Blood Pressure Your blood pressure is at goal today! Please continue to take your medications as prescribed. Please check your blood pressure at home once a week and bring in these readings at your next visit.   Diabetes Please keep up the good work! Continue to try and eat a healthy diet and exercise regularly  Scalp Psoriasis Please follow the instructions for the shampoo I have written for you. Please do not use for more than 4 weeks.   Foot Care I have placed a referral to the foot doctor  High Iron levels We will repeat your iron levels today  COPD Please follow up with [name]  I have ordered the following labs for you:   Lab Orders         Iron and IBC [id number])       Referrals ordered today:    Referral Orders         Ambulatory referral to Podiatry      I have ordered the following medication/changed the following medications:   Stop the following medications: There are no discontinued medications.   Start the following medications: Meds ordered this encounter  Medications   Clobetasol Propionate 0.05 % shampoo    Sig: Apply 1 application topically daily for 21 days. Apply to dry scalp once daily. Leave on the area for 15 minutes, add water, lather, and rinse thoroughly.    Dispense:  118 mL    Refill:  0     Follow up: 2 months diabetes check   Should you have any questions or concerns please call the internal medicine clinic at [phone].     Sanjuana Letters, D.O. [place]

## 2021-08-15 NOTE — Assessment & Plan Note (Signed)
Assessment: Patient states over the past few years she has had itching of her scalp.  She states that since she was a child she noticed a small round area on the back of her head.  On examination today she has skin flaking to large plaques, 1 on the right posterior portion of her scalp the second on the left and midportion of her scalp.  Both consistent with plaque psoriasis.  Order completed as well clobetasol shampoo 0.05%.  Patient to use once daily leave on scalp for 15 minutes lather and rinse discussed instructions with patient once he is more than 4 weeks.  Plan: -Clobetasol shampoo ordered

## 2021-08-15 NOTE — Assessment & Plan Note (Addendum)
Assessment: Tresiba 50 units daily, 15 units 3 times daily with meals and Jardiance 10 mg daily.  Patient's Tyler Aas was decreased at a practice that she was having 3-4 episodes of hypoglycemia in the mornings.  Since, glucose readings hypoglycemic events.  Patient will schedule appointment to be seen by Butch Penny for placement of CGM.  We will continue her on current treatment and repeat her A1c in 3 months  Patient states she is able to keep her toenails well kept, will refer to podiatry to assist in patient's foot care.  Plan: -Continue Tresiba 50 units daily, Humalog 15 units 3 times daily and Jardiance 10 mg daily -Follow-up with Butch Penny [place] diabetes coordinator for placement of CGM -Follow-up in 3 months for A1c check

## 2021-08-15 NOTE — Progress Notes (Addendum)
CC: Follow-up chronic medical conditions-diabetes, hypertension  HPI:  [name] is a 75 y.o. female with a past medical history stated below and presents today for for follow-up concerning her chronic medical conditions of hypertension, diabetes as well as acute concerns of itchy scalp. Please see problem based assessment and plan for additional details.  Past Medical History:  Diagnosis Date   Angiectasia    of colon and duodenum   CAD (coronary artery disease)    Candidal esophagitis (HCC)    COPD with acute exacerbation ([place])    Diabetes ([place])    Diverticulosis    GI bleed    H. pylori infection 10/2017   Heart failure (HCC)    Hypertension    IDA (iron deficiency anemia)    Internal hemorrhoids    MI (myocardial infarction) ([place])    Osteoarthritis    Pancreatic steatorrhea    Paroxysmal atrial fibrillation (HCC)    Tubular adenoma of colon     Current Outpatient Medications on File Prior to Visit  Medication Sig Dispense Refill   albuterol (ACCUNEB) 0.63 MG/3ML nebulizer solution Take 3 mLs (0.63 mg total) by nebulization every 6 (six) hours as needed for wheezing or shortness of breath. 75 mL 3   Blood Glucose Monitoring Suppl (ONETOUCH VERIO) w/Device KIT 1 Units by Does not apply route 3 (three) times daily. 1 kit 0   ferrous sulfate 325 (65 FE) MG tablet Take 325 mg by mouth daily with breakfast.     Fluticasone-Umeclidin-Vilant (TRELEGY ELLIPTA) 200-62.5-25 MCG/INH AEPB Inhale 1 puff into the lungs daily. 60 each 5   Fluticasone-Umeclidin-Vilant (TRELEGY ELLIPTA) 200-62.5-25 MCG/INH AEPB Inhale 1 puff into the lungs daily. 14 each 0   gabapentin (NEURONTIN) 100 MG capsule Take 200 mg by mouth 3 (three) times daily.     glucose blood (ONETOUCH VERIO) test strip 1 each by Other route 3 (three) times daily. Use as instructed 100 each 12   insulin degludec (TRESIBA FLEXTOUCH) 100 UNIT/ML FlexTouch Pen Inject 50 Units into the skin at bedtime. 3 mL 5   insulin  lispro (HUMALOG KWIKPEN) 100 UNIT/ML KwikPen Inject 15 Units into the skin 3 (three) times daily with meals. 15 mL 11   Insulin Pen Needle 32G X 4 MM MISC Use to inject insulin 4 times a day 360 each 3   Insulin Syringe-Needle U-100 31G X 15/64" 0.5 ML MISC Please use with insulin 100 each 3   ipratropium-albuterol (DUONEB) 0.5-2.5 (3) MG/3ML SOLN Take 3 mLs by nebulization 4 (four) times daily. DX J44.1 360 mL 3   JARDIANCE 10 MG TABS tablet TAKE 1 TABLET BY MOUTH ONCE DAILY BEFORE BREAKFAST 90 tablet 0   Lancets MISC 1 Units by Does not apply route in the morning, at noon, and at bedtime. 100 each 2   losartan (COZAAR) 100 MG tablet Take 100 mg by mouth daily.     lovastatin (MEVACOR) 20 MG tablet Take 20 mg by mouth every evening.     metoprolol succinate (TOPROL-XL) 100 MG 24 hr tablet Take 1 tablet (100 mg total) by mouth daily. Take with or immediately following a meal. 90 tablet 3   OneTouch Delica Lancets 96G MISC Please check your sugars 4x daily. 100 each 3   polyethylene glycol (MIRALAX / GLYCOLAX) 17 g packet Take 17 g by mouth as needed for mild constipation. 30 each 3   rivaroxaban (XARELTO) 20 MG TABS tablet Take 1 tablet (20 mg total) by mouth every morning. [place]  tablet 0   Vitamin D, Ergocalciferol, (DRISDOL) 1.25 MG (50000 UNIT) CAPS capsule Take 50,000 Units by mouth every 7 (seven) days. Mondays     No current facility-administered medications on file prior to visit.    Family History  Problem Relation Age of Onset   Aneurysm Mother    Uterine cancer Mother    Lung cancer Father    Diabetes Sister    Hypertension Sister    Ovarian cancer Daughter    Uterine cancer Maternal Aunt    Lung cancer Paternal Aunt     Social History   Socioeconomic History   Marital status: Single    Spouse name: Not on file   Number of children: Not on file   Years of education: Not on file   Highest education level: Not on file  Occupational History   Not on file  Tobacco Use    Smoking status: Every Day    Packs/day: 1.00    Years: 60.00    Pack years: 60.00    Types: Cigarettes   Smokeless tobacco: Former   Tobacco comments:    Currently smoking 0.5ppd as of 07/09/21  Substance and Sexual Activity   Alcohol use: Not Currently   Drug use: Never   Sexual activity: Not on file  Other Topics Concern   Not on file  Social History Narrative   Not on file   Social Determinants of Health   Financial Resource Strain: Not on file  Food Insecurity: Not on file  Transportation Needs: Not on file  Physical Activity: Not on file  Stress: Not on file  Social Connections: Not on file  Intimate Partner Violence: Not on file    Review of Systems: ROS negative except for what is noted on the assessment and plan.  Vitals:   08/15/21 1329  BP: 136/69  Pulse: 72  Temp: 98 F (36.7 C)  TempSrc: Oral  SpO2: 97%  Weight: 233 lb (105.7 kg)  Height: 5' 4"  (1.626 m)   Physical Exam: Constitutional: Well-appearing, no acute distress HENT: normocephalic atraumatic, mucous membranes moist Eyes: conjunctiva non-erythematous Neck: supple Cardiovascular: regular rate and rhythm, no m/r/g Pulmonary/Chest: normal work of breathing on room air, decreased breath sounds MSK: normal bulk and tone.  Bilateral feet without wounds.   Neurological: alert & oriented x 3. Normal sensation of the feet bilaterally Skin: warm and dry.  Scalp with right posterior raised plaque 5.5 cm, gray scaly flat plaque 4 cm long psych: Normal mood and thought process     Assessment & Plan:   See Encounters Tab for problem based charting.  Patient discussed with Dr. Laurena Slimmer, D.O. [facility], PGY-2 Pager: [phone], Phone: [phone] Date 08/15/2021 Time 4:41 PM

## 2021-08-15 NOTE — Assessment & Plan Note (Addendum)
Assessment: Regular rate and rhythm on my exam today. Current CHADSVASC of 6. Patient has follow up with cardiology next week. She is currently on xarelto 20 mg daily for anticoagulation  Plan: -continue xarelto -follow up with cardiology

## 2021-08-15 NOTE — Assessment & Plan Note (Addendum)
Assessment: Iron levels checked during last visit patient with history of GI bleed and recent dark tarry stools.  At that time her iron levels were quite elevated discussed with patient to stop taking her iron supplementation until we repeat her labs.  Plan: -Repeat iron panel pending  Addendum: TIBC elevated and with significant drop in iron level.  With patient's history of AVMs, suspect she is continue to have chronic slow GI bleeding.  Will call patient to discuss if she has noticed any dark tarry stools or bright red blood per rectum.  Do not believe she needs emergent hospitalization or colonoscopy.  This can be managed on outpatient basis with medications, will need to determine correct dosing of her ferrous sulfate do not put her in an iron overloaded state.

## 2021-08-16 ENCOUNTER — Encounter: Payer: Self-pay | Admitting: Internal Medicine

## 2021-08-16 ENCOUNTER — Telehealth: Payer: Self-pay | Admitting: *Deleted

## 2021-08-16 ENCOUNTER — Telehealth: Payer: Self-pay | Admitting: Student

## 2021-08-16 LAB — IRON AND TIBC
Iron Saturation: 9 % — CL (ref 15–55)
Iron: 36 ug/dL (ref 27–139)
Total Iron Binding Capacity: 420 ug/dL (ref 250–450)
UIBC: 384 ug/dL — ABNORMAL HIGH (ref 118–369)

## 2021-08-16 NOTE — Assessment & Plan Note (Signed)
Patient denied wanting  shingrix or tetanus vaccine at this time

## 2021-08-16 NOTE — Telephone Encounter (Signed)
Attempted to call patient x3 today to discuss iron panel labs. Quite concerning how quickly her iron levels have dropped over the past month. I have a low suspicion for an acute GI bleed and need for emergent admission, but will need to discuss with the patient further to determine new PO iron supplementation dosing.

## 2021-08-16 NOTE — Telephone Encounter (Signed)
CRITICAL VALUE STICKER  CRITICAL VALUE: Iron Saturation 9  MD NOTIFIED: Gaylan Gerold, DO  TIME OF NOTIFICATION: [place]  L. Alica Shellhammer, BSN, RN-BC

## 2021-08-16 NOTE — Progress Notes (Signed)
Internal Medicine Clinic Attending  Case discussed with Dr. Katsadouros  At the time of the visit.  We reviewed the resident's history and exam and pertinent patient test results.  I agree with the assessment, diagnosis, and plan of care documented in the resident's note.  

## 2021-08-19 ENCOUNTER — Telehealth: Payer: Self-pay | Admitting: Student

## 2021-08-19 NOTE — Telephone Encounter (Signed)
Called patient to discuss recent iron studies and acute drop in iron levels. Patient denies dark tarry stools recently. Chronic GI bleed, will need to follow up with her GI doctor. She states she will call them to schedule an appointment tomorrow. She had no other questions or concerns.    Appears as though she has been taking her iron supplementation daily, will recommend she take it MWF and repeat labs in 1-2 months.

## 2021-08-20 ENCOUNTER — Ambulatory Visit (INDEPENDENT_AMBULATORY_CARE_PROVIDER_SITE_OTHER): Payer: 59 | Admitting: Podiatrist

## 2021-08-20 ENCOUNTER — Encounter: Payer: Self-pay | Admitting: Podiatrist

## 2021-08-20 ENCOUNTER — Other Ambulatory Visit: Payer: Self-pay

## 2021-08-20 ENCOUNTER — Ambulatory Visit (INDEPENDENT_AMBULATORY_CARE_PROVIDER_SITE_OTHER): Payer: 59

## 2021-08-20 DIAGNOSIS — I5022 Chronic systolic (congestive) heart failure: Secondary | ICD-10-CM | POA: Insufficient documentation

## 2021-08-20 DIAGNOSIS — M5136 Other intervertebral disc degeneration, lumbar region: Secondary | ICD-10-CM | POA: Insufficient documentation

## 2021-08-20 DIAGNOSIS — M7750 Other enthesopathy of unspecified foot: Secondary | ICD-10-CM

## 2021-08-20 DIAGNOSIS — E785 Hyperlipidemia, unspecified: Secondary | ICD-10-CM | POA: Insufficient documentation

## 2021-08-20 DIAGNOSIS — B351 Tinea unguium: Secondary | ICD-10-CM | POA: Diagnosis not present

## 2021-08-20 DIAGNOSIS — E669 Obesity, unspecified: Secondary | ICD-10-CM | POA: Insufficient documentation

## 2021-08-20 DIAGNOSIS — M7752 Other enthesopathy of left foot: Secondary | ICD-10-CM | POA: Diagnosis not present

## 2021-08-20 DIAGNOSIS — M19072 Primary osteoarthritis, left ankle and foot: Secondary | ICD-10-CM

## 2021-08-20 DIAGNOSIS — Z720 Tobacco use: Secondary | ICD-10-CM | POA: Insufficient documentation

## 2021-08-20 DIAGNOSIS — M79676 Pain in unspecified toe(s): Secondary | ICD-10-CM

## 2021-08-20 DIAGNOSIS — E0840 Diabetes mellitus due to underlying condition with diabetic neuropathy, unspecified: Secondary | ICD-10-CM | POA: Diagnosis not present

## 2021-08-20 DIAGNOSIS — I452 Bifascicular block: Secondary | ICD-10-CM | POA: Insufficient documentation

## 2021-08-20 DIAGNOSIS — M797 Fibromyalgia: Secondary | ICD-10-CM | POA: Insufficient documentation

## 2021-08-20 DIAGNOSIS — M8580 Other specified disorders of bone density and structure, unspecified site: Secondary | ICD-10-CM | POA: Insufficient documentation

## 2021-08-20 NOTE — Patient Instructions (Signed)

## 2021-08-20 NOTE — Progress Notes (Signed)
Chief Complaint  Patient presents with   Nail Problem    Toenails - can't trim well herself, feels some are ingrown   Ankle Pain    Medial ankle left - aching and swelling x few months, mostly pain with weight bearing, no injury, moved here in April and did have it checked prior to move - "said was twisted"   FYI    Would like to get established with a foot doctor for regular maintenance    Diabetes    Last a1c was 6.2   New Patient (Initial Visit)     HPI: Patient is 75 y.o. female who presents today for the concerns as listed above.  She states she is diabetic and she would like to be established with a foot doctor for her routine diabetic foot exams and nail trims.  She states that some of her nails are ingrown and she has difficulty trimming them herself.  She also relates they are uncomfortable with ambulation and in shoe gear.  She has a secondary concern over pain on the medial left ankle.  She states its been aching and swelling for a few months mostly with weightbearing.  She also states that she cannot wear enclosed sneakers and she prefers to wear sandals.  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   Tobacco abuse 08/20/2021   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   Healthcare maintenance 05/23/2021   Paroxysmal A-fib ([place]) 04/25/2021   Angiodysplasia of duodenum with hemorrhage    Benign neoplasm of ascending colon    Benign neoplasm of transverse colon    Benign neoplasm of sigmoid colon    Angiodysplasia of the colon    Heart failure (HCC)    Candidal esophagitis (HCC)    Iron deficiency anemia    COPD with acute exacerbation ([place])  04/08/2021   Insulin dependent type 2 diabetes mellitus ([place]) 04/08/2021   CAD (s/p MI and CABG x3) 04/08/2021   HTN (hypertension) 04/08/2021   History of GI bleed    Left ankle pain 12/24/2020   Syncope and collapse 09/06/2020   Benign paroxysmal positional vertigo of right ear 11/13/2019   TIA (transient ischemic attack) 11/13/2019   Morbid obesity due to excess calories ([place]) 04/14/2019   Abnormal nuclear stress test 12/31/2018   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     albuterol (ACCUNEB) 0.63 MG/3ML nebulizer solution Take 3 mLs (0.63 mg total) by nebulization every 6 (six) hours as needed for wheezing or shortness of breath. 75 mL 3   Blood Glucose Monitoring Suppl (ONETOUCH VERIO) w/Device KIT 1 Units by Does not apply route 3 (three) times daily. 1 kit 0   BREO ELLIPTA 100-25  MCG/INH AEPB Inhale 1 puff into the lungs daily.     Clobetasol Propionate 0.05 % shampoo Apply 1 application topically daily for 21 days. Apply to dry scalp once daily. Leave on the area for 15 minutes, add water, lather, and rinse thoroughly. 118 mL 0   ferrous sulfate 325 (65 FE) MG tablet Take 325 mg by mouth every Monday, Wednesday, and Friday.     gabapentin (NEURONTIN) 100 MG capsule Take 200 mg by mouth 3 (three) times daily.     glucose blood (ONETOUCH VERIO) test strip 1 each by Other route 3 (three) times daily. Use as instructed 100 each 12   insulin degludec (TRESIBA FLEXTOUCH)  100 UNIT/ML FlexTouch Pen Inject 50 Units into the skin at bedtime. 3 mL 5   insulin lispro (HUMALOG KWIKPEN) 100 UNIT/ML KwikPen Inject 15 Units into the skin 3 (three) times daily with meals. 15 mL 11   Insulin Pen Needle 32G X 4 MM MISC Use to inject insulin 4 times a day 360 each 3   Insulin Syringe-Needle U-100 31G X 15/64" 0.5 ML MISC Please use with insulin 100 each 3   ipratropium-albuterol (DUONEB) 0.5-2.5 (3) MG/3ML SOLN Take 3 mLs by nebulization 4 (four) times daily. DX J44.1 360 mL 3   isosorbide mononitrate (IMDUR) 60 MG 24 hr tablet Take 60 mg by mouth daily.     JARDIANCE 10 MG TABS tablet TAKE 1 TABLET BY MOUTH ONCE DAILY BEFORE BREAKFAST 90 tablet 0   Lancets MISC 1 Units by Does not apply route in the morning, at noon, and at bedtime. 100 each 2   losartan (COZAAR) 100 MG tablet Take 100 mg by mouth daily.     lovastatin (MEVACOR) 20 MG tablet Take 20 mg by mouth every evening.     metoprolol succinate (TOPROL-XL) 100 MG 24 hr tablet Take 1 tablet (100 mg total) by mouth daily. Take with or immediately following a meal. 90 tablet 3   OneTouch Delica Lancets 24Q MISC Please check your sugars 4x daily. 100 each 3   polyethylene glycol (MIRALAX / GLYCOLAX) 17 g packet Take 17 g by mouth as needed for mild constipation. 30 each 3   rivaroxaban (XARELTO) 20 MG TABS tablet Take 1 tablet (20 mg total) by mouth every morning. 30 tablet 0   Vitamin D, Ergocalciferol, (DRISDOL) 1.25 MG (50000 UNIT) CAPS capsule Take 50,000 Units by mouth every 7 (seven) days. Mondays     No current facility-administered medications on file prior to visit.    Allergies  Allergen Reactions   Sulfa Antibiotics Other (See Comments), Hives and Itching    Pt does not like how it makes her feel.  Other reaction(s): Other (See Comments) Pt does not like how it makes her feel.     Penicillins Hives and Itching    Review of Systems No fevers, chills, nausea, muscle aches, no difficulty breathing, no calf  pain, no chest pain or shortness of breath.   Physical Exam  GENERAL APPEARANCE: Alert, conversant. Appropriately groomed. No acute distress.   VASCULAR: Pedal pulses faintly palpable DP and PT bilateral.  Capillary refill time is immediate to all digits,  Proximal to distal cooling it warm to warm.  Digital perfusion adequate.   NEUROLOGIC: sensation is diminished to 5.07 monofilament at 2/5 sites bilateral.  Light touch is intact bilateral, vibratory sensation absent bilateral  MUSCULOSKELETAL: acceptable muscle strength, tone and stability bilateral.  No gross boney pedal deformities noted.  Pain along the medial  ankle joint as well as the posterior tibial tendon medial to the medial malleolus is identified.  No specific area of tenderness is identified.    DERMATOLOGIC: skin is warm, supple, and dry.  No open lesions noted.  No rash, no pre ulcerative lesions. Digital nails are thick, discolored, dystrophic, brittle, incurvated and clinically mycotic x 10.  Pain with dorsal plantar pressure as well as with debridement is noted.  X-ray evaluation 3 views of the left ankle are obtained.  Midfoot spurring is noted as well as some arthritic changes at the ankle.  No acute osseous abnormalities are identified.   Assessment   1. Pain due to onychomycosis of toenail   2. Tendonitis of ankle   3. Arthritis of left ankle   4. Diabetes mellitus due to underlying condition, controlled, with diabetic neuropathy, unspecified whether long term insulin use ([place])       Plan  Exam and x-ray findings are discussed with the patient.  Discussed that the posterior tibial tendon may be inflamed and I recommended a compressive ankle brace.  Also recommended that she try and wear enclosed supportive shoes as well.  If the pain does not get better she is instructed to call.  Debridement of toenails was also recommended.  Onychoreduction of symptomatic toenails was performed via nail nipper and power burr  without iatrogenic incident.  Patient was instructed on signs and symptoms of infection and was told to call immediately should any of these arise.    She will see be seen back in 3 months for routine care and will call sooner if any concerns arise.

## 2021-08-21 NOTE — Progress Notes (Signed)
Cardiology Office Note:    Date:  08/22/2021   ID:  Caitlyn Lawrence, DOB [birth year], MRN [id number]  PCP:  Caitlyn Pope, MD  Cardiologist:  None  Electrophysiologist:  None   Referring MD: Caitlyn Contes, MD   Chief Complaint  Patient presents with   Coronary Artery Disease    History of Present Illness:    Caitlyn Lawrence is a 75 y.o. female with a hx of CAD status post CABG in 2005, COPD, T2DM, hypertension, paroxysmal atrial fibrillation who is referred by Dr. Dareen Lawrence for evaluation of CAD.  Previously followed with Dr. Moshe Lawrence in cardiology in [facility].  Moved to [place] in April 2022. Echo on 01/21/2021 showed EF 60 to 65%, normal RV function, no significant valvular disease, RVSP 40-45.  Nuclear stress test 2019 showed small basal lateral area of reversibility, EF 69%.  Has loop recorder that was placed for syncopal episodes after unremarkable event monitor.  Last interrogation in May showed nocturnal pauses, suspected sleep apnea.  Her CABG was in 2005 in [place].  She has not had to have subsequent intervention.  She denies any chest pain or dyspnea.  Does report intermittent palpitations.  Had 5 syncopal episodes last year.  Episodes involved sudden syncope, no prodromal symptoms.  Last episode occurred in March, reports she was walking in her trailer next and she remembers was waking up on the ground.  Loop recorder was placed in [place].  Device interrogation in May showed 1 pause in early a.m., suspected OSA.  Reports some lower extremity edema.  She has been taking Xarelto.  Has had issues with blood in stool in past, sees GI.  Denies any recent bleeding.    Past Medical History:  Diagnosis Date   Angiectasia    of colon and duodenum   CAD (coronary artery disease)    Candidal esophagitis (HCC)    COPD with acute exacerbation (HCC)    Diabetes ([place])    Diverticulosis    GI bleed    H. pylori infection 10/2017   Heart failure  (HCC)    Hypertension    IDA (iron deficiency anemia)    Internal hemorrhoids    MI (myocardial infarction) ([place])    Osteoarthritis    Pancreatic steatorrhea    Paroxysmal atrial fibrillation (HCC)    Tubular adenoma of colon     Past Surgical History:  Procedure Laterality Date   CARPAL TUNNEL RELEASE     CATARACT EXTRACTION Bilateral    COLONOSCOPY WITH PROPOFOL N/A 04/10/2021   Procedure: COLONOSCOPY WITH PROPOFOL;  Surgeon: Caitlyn Bears, MD;  Location: [place];  Service: Gastroenterology;  Laterality: N/A;   CORONARY ARTERY BYPASS GRAFT N/A 2005   ESOPHAGOGASTRODUODENOSCOPY (EGD) WITH PROPOFOL N/A 04/10/2021   Procedure: ESOPHAGOGASTRODUODENOSCOPY (EGD) WITH PROPOFOL;  Surgeon: Caitlyn Bears, MD;  Location[facility] ENDOSCOPY;  Service: Gastroenterology;  Laterality: N/A;   HEMOSTASIS CONTROL  04/10/2021   Procedure: HEMOSTASIS CONTROL;  Surgeon: Caitlyn Bears, MD;  Location: [place];  Service: Gastroenterology;;  EPI injection   HOT HEMOSTASIS N/A 04/10/2021   Procedure: HOT HEMOSTASIS (ARGON PLASMA COAGULATION/BICAP);  Surgeon: Caitlyn Bears, MD;  Location: [facility] ENDOSCOPY;  Service: Gastroenterology;  Laterality: N/A;  EGD and COLON   KNEE ARTHROPLASTY Bilateral    POLYPECTOMY  04/10/2021   Procedure: POLYPECTOMY;  Surgeon: Caitlyn Bears, MD;  Location: [place];  Service: Gastroenterology;;   TOTAL SHOULDER ARTHROPLASTY Right 12/2009   TUBAL LIGATION      Current  Medications: Current Meds  Medication Sig   albuterol (ACCUNEB) 0.63 MG/3ML nebulizer solution Take 3 mLs (0.63 mg total) by nebulization every 6 (six) hours as needed for wheezing or shortness of breath.   amLODipine (NORVASC) 2.5 MG tablet Take 1 tablet by mouth daily.   Blood Glucose Monitoring Suppl (ONETOUCH VERIO) w/Device KIT 1 Units by Does not apply route 3 (three) times daily.   BREO ELLIPTA 100-25 MCG/INH AEPB Inhale 1 puff into the lungs daily.   Clobetasol Propionate 0.05 % shampoo Apply 1  application topically daily for 21 days. Apply to dry scalp once daily. Leave on the area for 15 minutes, add water, lather, and rinse thoroughly.   COMBIVENT RESPIMAT 20-100 MCG/ACT AERS respimat Inhale 1 puff into the lungs 4 (four) times daily.   gabapentin (NEURONTIN) 100 MG capsule Take 200 mg by mouth 3 (three) times daily.   glucose blood (ONETOUCH VERIO) test strip 1 each by Other route 3 (three) times daily. Use as instructed   insulin degludec (TRESIBA FLEXTOUCH) 100 UNIT/ML FlexTouch Pen Inject 50 Units into the skin at bedtime.   insulin lispro (HUMALOG KWIKPEN) 100 UNIT/ML KwikPen Inject 15 Units into the skin 3 (three) times daily with meals.   Insulin Pen Needle 32G X 4 MM MISC Use to inject insulin 4 times a day   Insulin Syringe-Needle U-100 31G X 15/64" 0.5 ML MISC Please use with insulin   ipratropium-albuterol (DUONEB) 0.5-2.5 (3) MG/3ML SOLN Take 3 mLs by nebulization 4 (four) times daily. DX J44.1   JARDIANCE 10 MG TABS tablet TAKE 1 TABLET BY MOUTH ONCE DAILY BEFORE BREAKFAST   Lancets MISC 1 Units by Does not apply route in the morning, at noon, and at bedtime.   lovastatin (MEVACOR) 20 MG tablet Take 20 mg by mouth every evening.   metoprolol succinate (TOPROL-XL) 100 MG 24 hr tablet Take 1 tablet (100 mg total) by mouth daily. Take with or immediately following a meal.   OneTouch Delica Lancets 44Y MISC Please check your sugars 4x daily.   polyethylene glycol (MIRALAX / GLYCOLAX) 17 g packet Take 17 g by mouth as needed for mild constipation.   rivaroxaban (XARELTO) 20 MG TABS tablet Take 1 tablet (20 mg total) by mouth every morning.   Vitamin D, Ergocalciferol, (DRISDOL) 1.25 MG (50000 UNIT) CAPS capsule Take 50,000 Units by mouth every 7 (seven) days. Mondays   [DISCONTINUED] ferrous sulfate 325 (65 FE) MG tablet Take 325 mg by mouth every Monday, Wednesday, and Friday.   [DISCONTINUED] isosorbide mononitrate (IMDUR) 60 MG 24 hr tablet Take 60 mg by mouth daily.    [DISCONTINUED] losartan (COZAAR) 100 MG tablet Take 100 mg by mouth daily.     Allergies:   Sulfa antibiotics and Penicillins   Social History   Socioeconomic History   Marital status: Single    Spouse name: Not on file   Number of children: Not on file   Years of education: Not on file   Highest education level: Not on file  Occupational History   Not on file  Tobacco Use   Smoking status: Every Day    Packs/day: 1.00    Years: 60.00    Pack years: 60.00    Types: Cigarettes   Smokeless tobacco: Former   Tobacco comments:    Currently smoking 0.5ppd as of 07/09/21  Substance and Sexual Activity   Alcohol use: Not Currently   Drug use: Never   Sexual activity: Not on file  Other Topics  Concern   Not on file  Social History Narrative   Not on file   Social Determinants of Health   Financial Resource Strain: Not on file  Food Insecurity: Not on file  Transportation Needs: Not on file  Physical Activity: Not on file  Stress: Not on file  Social Connections: Not on file     Family History: The patient's family history includes Aneurysm in her mother; Diabetes in her sister; Hypertension in her sister; Lung cancer in her father and paternal aunt; Ovarian cancer in her daughter; Uterine cancer in her maternal aunt and mother.  ROS:   Please see the history of present illness.     All other systems reviewed and are negative.  EKGs/Labs/Other Studies Reviewed:    The following studies were reviewed today:   EKG:  EKG is ordered today.  The ekg ordered today demonstrates normal sinus rhythm, rate 69, right bundle branch block, no ST abnormalities  Recent Labs: 04/08/2021: B Natriuretic Peptide 108.0 06/10/2021: ALT 16; BUN 11; Creatinine, Ser 0.66; Potassium 4.3; Sodium 139 07/15/2021: Hemoglobin 13.2; Platelets 337  Recent Lipid Panel No results found for: CHOL, TRIG, HDL, CHOLHDL, VLDL, LDLCALC, LDLDIRECT  Physical Exam:    VS:  BP 124/78   Pulse 69   Ht 5' 4"   (1.626 m)   Wt 232 lb 12.8 oz (105.6 kg)   SpO2 95%   BMI 39.96 kg/m     Wt Readings from Last 3 Encounters:  08/22/21 232 lb 12.8 oz (105.6 kg)  08/15/21 233 lb (105.7 kg)  07/15/21 233 lb 1.6 oz (105.7 kg)     GEN:  Well nourished, well developed in no acute distress HEENT: Normal NECK: No JVD; No carotid bruits LYMPHATICS: No lymphadenopathy CARDIAC: RRR, no murmurs, rubs, gallops RESPIRATORY:  Clear to auscultation without rales, wheezing or rhonchi  ABDOMEN: Soft, non-tender, non-distended MUSCULOSKELETAL:  No edema; No deformity  SKIN: Warm and dry NEUROLOGIC:  Alert and oriented x 3 PSYCHIATRIC:  Normal affect   ASSESSMENT:    1. Coronary artery disease involving native coronary artery of native heart without angina pectoris   2. PAF (paroxysmal atrial fibrillation) ([place])   3. Essential hypertension   4. Syncope and collapse   5. Hyperlipidemia, unspecified hyperlipidemia type   6. Daytime somnolence    PLAN:     CAD: s/p CABG in 2005.  Denies any anginal symptoms. -Continue Xarelto -Continue lovastatin 20 mg.  LDL 81 on 11/21/2020.  Goal LDL less than 70, will check lipid panel -Continue Toprol-XL 100 mg daily -Continue Imdur 60 mg daily -Check echocardiogram  Paroxysmal atrial fibrillation: CHA2DS2-VASc 6 (hypertension, age x2, diabetes, CAD, female) -Continue Xarelto.  Reports history of GI bleed, follows with GI.  Denies any recent bleeding. -Continue Toprol-XL 100 mg daily  Syncope: Reported multiple episodes of sudden loss of consciousness without prodromal symptoms, concerning for arrhythmia.  Last episode in March 2022. Loop recorder was placed in [place].  Will establish with Dr. Sallyanne Kuster for monitoring device.  Hypertension: On amlodipine 2.5 mg daily, Imdur 60 mg daily, losartan 100 mg daily, Toprol-XL 100 mg daily.  Appears controlled  T2DM: On insulin, Jardiance.  A1c 6.2% on 07/15/2021.  Hyperlipidemia: On lovastatin 20 mg daily.  LDL 81 on  11/21/20  Daytime somnolence: Given daytime somnolence and nocturnal pauses seen on prior loop recorder interrogation, recommend sleep study.  [place] 10  RTC in 6 months  Medication Adjustments/Labs and Tests Ordered: Current medicines are reviewed at length with the  patient today.  Concerns regarding medicines are outlined above.  Orders Placed This Encounter  Procedures   Basic metabolic panel   Lipid panel   EKG 12-Lead   ECHOCARDIOGRAM COMPLETE   Split night study    No orders of the defined types were placed in this encounter.   Patient Instructions  Medication Instructions:  Your physician recommends that you continue on your current medications as directed. Please refer to the Current Medication list given to you today.  *If you need a refill on your cardiac medications before your next appointment, please call your pharmacy*   Lab Work: BMET, Lipid today  If you have labs (blood work) drawn today and your tests are completely normal, you will receive your results only by: [place] (if you have MyChart) OR A paper copy in the mail If you have any lab test that is abnormal or we need to change your treatment, we will call you to review the results.   Testing/Procedures: Your physician has requested that you have an echocardiogram. Echocardiography is a painless test that uses sound waves to create images of your heart. It provides your doctor with information about the size and shape of your heart and how well your heart's chambers and valves are working. This procedure takes approximately one hour. There are no restrictions for this procedure. This will be done at our [facility] location:  [place] has recommended that you have a sleep study. This test records several body functions during sleep, including: brain activity, eye movement, oxygen and carbon dioxide blood levels, heart rate and rhythm, breathing rate and  rhythm, the flow of air through your mouth and nose, snoring, body muscle movements, and chest and belly movement.  Follow-Up: At [facility], you and your health needs are our priority.  As part of our continuing mission to provide you with exceptional heart care, we have created designated Provider Care Teams.  These Care Teams include your primary Cardiologist (physician) and Advanced Practice Providers (APPs -  Physician Assistants and Nurse Practitioners) who all work together to provide you with the care you need, when you need it.  We recommend signing up for the patient portal called "MyChart".  Sign up information is provided on this After Visit Summary.  MyChart is used to connect with patients for Virtual Visits (Telemedicine).  Patients are able to view lab/test results, encounter notes, upcoming appointments, etc.  Non-urgent messages can be sent to your provider as well.   To learn more about what you can do with MyChart, go to [email or web address].    Your next appointment:   6 months with Dr. Gardiner Rhyme  Dr. Sallyanne Kuster to manage loop recorder    Signed, Donato Heinz, MD  08/22/2021 12:38 PM    [place]

## 2021-08-22 ENCOUNTER — Other Ambulatory Visit: Payer: Self-pay

## 2021-08-22 ENCOUNTER — Encounter: Payer: Self-pay | Admitting: Student

## 2021-08-22 ENCOUNTER — Ambulatory Visit (INDEPENDENT_AMBULATORY_CARE_PROVIDER_SITE_OTHER): Payer: 59 | Admitting: Student

## 2021-08-22 ENCOUNTER — Encounter: Payer: Self-pay | Admitting: Cardiology

## 2021-08-22 ENCOUNTER — Ambulatory Visit (INDEPENDENT_AMBULATORY_CARE_PROVIDER_SITE_OTHER): Payer: 59 | Admitting: Cardiology

## 2021-08-22 ENCOUNTER — Telehealth: Payer: Self-pay | Admitting: *Deleted

## 2021-08-22 VITALS — BP 124/78 | HR 69 | Ht 64.0 in | Wt 232.8 lb

## 2021-08-22 VITALS — BP 123/64 | HR 83 | Temp 98.0°F | Ht 64.0 in | Wt 231.6 lb

## 2021-08-22 DIAGNOSIS — E785 Hyperlipidemia, unspecified: Secondary | ICD-10-CM

## 2021-08-22 DIAGNOSIS — I480 Paroxysmal atrial fibrillation: Secondary | ICD-10-CM | POA: Diagnosis not present

## 2021-08-22 DIAGNOSIS — R55 Syncope and collapse: Secondary | ICD-10-CM | POA: Diagnosis not present

## 2021-08-22 DIAGNOSIS — D500 Iron deficiency anemia secondary to blood loss (chronic): Secondary | ICD-10-CM | POA: Diagnosis not present

## 2021-08-22 DIAGNOSIS — L409 Psoriasis, unspecified: Secondary | ICD-10-CM | POA: Diagnosis not present

## 2021-08-22 DIAGNOSIS — T23121A Burn of first degree of single right finger (nail) except thumb, initial encounter: Secondary | ICD-10-CM | POA: Diagnosis not present

## 2021-08-22 DIAGNOSIS — I2510 Atherosclerotic heart disease of native coronary artery without angina pectoris: Secondary | ICD-10-CM

## 2021-08-22 DIAGNOSIS — I10 Essential (primary) hypertension: Secondary | ICD-10-CM

## 2021-08-22 DIAGNOSIS — R400 Somnolence: Secondary | ICD-10-CM

## 2021-08-22 DIAGNOSIS — E119 Type 2 diabetes mellitus without complications: Secondary | ICD-10-CM | POA: Diagnosis not present

## 2021-08-22 DIAGNOSIS — Z794 Long term (current) use of insulin: Secondary | ICD-10-CM

## 2021-08-22 LAB — BASIC METABOLIC PANEL
BUN/Creatinine Ratio: 12 (ref 12–28)
BUN: 9 mg/dL (ref 8–27)
CO2: 25 mmol/L (ref 20–29)
Calcium: 9.5 mg/dL (ref 8.7–10.3)
Chloride: 99 mmol/L (ref 96–106)
Creatinine, Ser: 0.73 mg/dL (ref 0.57–1.00)
Glucose: 108 mg/dL — ABNORMAL HIGH (ref 65–99)
Potassium: 4.3 mmol/L (ref 3.5–5.2)
Sodium: 138 mmol/L (ref 134–144)
eGFR: 86 mL/min/{1.73_m2} (ref >59)

## 2021-08-22 LAB — LIPID PANEL
Chol/HDL Ratio: 2 ratio (ref 0.0–4.4)
Cholesterol, Total: 175 mg/dL (ref 100–199)
HDL: 87 mg/dL (ref >39)
LDL Chol Calc (NIH): 72 mg/dL (ref 0–99)
Triglycerides: 89 mg/dL (ref 0–149)
VLDL Cholesterol Cal: 16 mg/dL (ref 5–40)

## 2021-08-22 MED ORDER — TRESIBA FLEXTOUCH 100 UNIT/ML ~~LOC~~ SOPN: 50.0000 [IU] | PEN_INJECTOR | Freq: Every day | SUBCUTANEOUS | 2 refills | Status: DC

## 2021-08-22 MED ORDER — FERROUS SULFATE 325 (65 FE) MG PO TABS: 325.0000 mg | ORAL_TABLET | ORAL | 0 refills | Status: AC

## 2021-08-22 MED ORDER — INSULIN PEN NEEDLE 32G X 4 MM MISC: 3 refills | Status: DC

## 2021-08-22 NOTE — Assessment & Plan Note (Addendum)
Patient report a burn right index finger yesterday.  She was cooking and had oil splashed on the finger.  She immediately put in cold water and apply cocoa butter on top.  She denies pain, fever or chills.  Denies any open wound or purulent discharge.  Physical exam showed a superficial burn of the right index finger around the MCP joint.  Wound appears noninfected.  Patient still have full range of motion of right index finger.  -Advised patient to keep the wound clean.  She will let us know if she has any symptoms of infection such as fever, chills, or wound drainage.

## 2021-08-22 NOTE — Assessment & Plan Note (Signed)
Patient report has not received the iron supplement from her pharmacy.  We will resend the prescription.

## 2021-08-22 NOTE — Patient Instructions (Addendum)
Medication Instructions:  Your physician recommends that you continue on your current medications as directed. Please refer to the Current Medication list given to you today.  *If you need a refill on your cardiac medications before your next appointment, please call your pharmacy*   Lab Work: BMET, Lipid today  If you have labs (blood work) drawn today and your tests are completely normal, you will receive your results only by: [place] (if you have MyChart) OR A paper copy in the mail If you have any lab test that is abnormal or we need to change your treatment, we will call you to review the results.   Testing/Procedures: Your physician has requested that you have an echocardiogram. Echocardiography is a painless test that uses sound waves to create images of your heart. It provides your doctor with information about the size and shape of your heart and how well your heart's chambers and valves are working. This procedure takes approximately one hour. There are no restrictions for this procedure. This will be done at our [facility] location:  [place] has recommended that you have a sleep study. This test records several body functions during sleep, including: brain activity, eye movement, oxygen and carbon dioxide blood levels, heart rate and rhythm, breathing rate and rhythm, the flow of air through your mouth and nose, snoring, body muscle movements, and chest and belly movement.  Follow-Up: At [facility], you and your health needs are our priority.  As part of our continuing mission to provide you with exceptional heart care, we have created designated Provider Care Teams.  These Care Teams include your primary Cardiologist (physician) and Advanced Practice Providers (APPs -  Physician Assistants and Nurse Practitioners) who all work together to provide you with the care you need, when you need it.  We recommend signing up for the  patient portal called "MyChart".  Sign up information is provided on this After Visit Summary.  MyChart is used to connect with patients for Virtual Visits (Telemedicine).  Patients are able to view lab/test results, encounter notes, upcoming appointments, etc.  Non-urgent messages can be sent to your provider as well.   To learn more about what you can do with MyChart, go to [email or web address].    Your next appointment:   6 months with Dr. Gardiner Rhyme  Dr. Sallyanne Kuster to manage loop recorder

## 2021-08-22 NOTE — Assessment & Plan Note (Signed)
Report improvement of her symptoms with clobetasol shampoo.

## 2021-08-22 NOTE — Telephone Encounter (Signed)
-----   Message from Silverio Lay, RN sent at 08/22/2021  1:19 PM EDT ----- Regarding: sleep study Sleep study ordered.   Thanks!

## 2021-08-22 NOTE — Assessment & Plan Note (Signed)
-   Tyler Aas refill sent

## 2021-08-22 NOTE — Telephone Encounter (Signed)
Left sleep study appointment on voicemail. Contact information for [facility] sleep lab provided to the patient to contact if the appointment date and time needs to be changed.

## 2021-08-22 NOTE — Progress Notes (Signed)
   CC: Burn of right index finger  HPI:  Caitlyn Lawrence is a 75 y.o. with past medical history of hypertension, diabetes and COPD who presented to clinic today for burn on the right index finger.  Please see problem based charting for detailed  Past Medical History:  Diagnosis Date   Angiectasia    of colon and duodenum   CAD (coronary artery disease)    Candidal esophagitis (HCC)    COPD with acute exacerbation ([place])    Diabetes ([place])    Diverticulosis    GI bleed    H. pylori infection 10/2017   Heart failure (HCC)    Hypertension    IDA (iron deficiency anemia)    Internal hemorrhoids    MI (myocardial infarction) (HCC)    Osteoarthritis    Pancreatic steatorrhea    Paroxysmal atrial fibrillation (HCC)    Tubular adenoma of colon    Review of Systems: Per HPI  Physical Exam:  Vitals:   08/22/21 1332  BP: 123/64  Pulse: 83  Temp: 98 F (36.7 C)  TempSrc: Oral  SpO2: 98%  Weight: 231 lb 9.6 oz (105.1 kg)  Height: '5\' 4"'$  (1.626 m)   Physical Exam Constitutional:      General: She is not in acute distress.    Appearance: She is not toxic-appearing.  HENT:     Head: Normocephalic.     Ears:     Comments: A small scabbed over wound seen in the left ear canal.  Cardiovascular:     Rate and Rhythm: Normal rate and regular rhythm.     Heart sounds: Normal heart sounds.  Pulmonary:     Effort: Pulmonary effort is normal.  Musculoskeletal:     Cervical back: Normal range of motion.     Comments: Burn wound seen on the right index finger around the MCP joint.  The wound appears clean and noninfected.  There is no open skin or purulent discharge.  No pain to palpation.  Patient still have normal range of motion of the finger.  Radial pulse palpated.  No other wounds seen on right hand.  Skin:    General: Skin is warm.  Neurological:     Mental Status: She is alert and oriented to person, place, and time.      Assessment & Plan:   See Encounters Tab for  problem based charting.  Patient discussed with Dr. Evette Doffing

## 2021-08-27 NOTE — Progress Notes (Signed)
Internal Medicine Clinic Attending  Case discussed with Dr. Nguyen  At the time of the visit.  We reviewed the resident's history and exam and pertinent patient test results.  I agree with the assessment, diagnosis, and plan of care documented in the resident's note. 

## 2021-08-28 ENCOUNTER — Ambulatory Visit
Admission: RE | Admit: 2021-08-28 | Discharge: 2021-08-28 | Disposition: A | Discharge status: Home / Self Care | Payer: 59 | Source: Ambulatory Visit | Attending: Acute Care | Admitting: Acute Care

## 2021-08-28 ENCOUNTER — Encounter: Payer: Self-pay | Admitting: Internal Medicine

## 2021-08-28 ENCOUNTER — Other Ambulatory Visit: Payer: Self-pay

## 2021-08-28 ENCOUNTER — Encounter: Payer: Self-pay | Admitting: Acute Care

## 2021-08-28 ENCOUNTER — Ambulatory Visit (INDEPENDENT_AMBULATORY_CARE_PROVIDER_SITE_OTHER): Payer: 59 | Admitting: Acute Care

## 2021-08-28 DIAGNOSIS — Z87891 Personal history of nicotine dependence: Secondary | ICD-10-CM

## 2021-08-28 DIAGNOSIS — F17210 Nicotine dependence, cigarettes, uncomplicated: Secondary | ICD-10-CM | POA: Diagnosis not present

## 2021-08-28 NOTE — Progress Notes (Signed)
Virtual Visit via Telephone Note  I connected with Caitlyn Lawrence on 08/28/21 at  9:00 AM EDT by telephone and verified that I am speaking with the correct person using two identifiers.  Location: Patient: At home Provider: [place], [place], [place], [address]    I discussed the limitations, risks, security and privacy concerns of performing an evaluation and management service by telephone and the availability of in person appointments. I also discussed with the patient that there may be a patient responsible charge related to this service. The patient expressed understanding and agreed to proceed.   Shared Decision Making Visit Lung Cancer Screening Program [phone])   Eligibility: Age 75 y.o. Pack Years Smoking History Calculation 45 pack year smoking history (# packs/per year x # years smoked) Recent History of coughing up blood  no Unexplained weight loss? no ( >Than 15 pounds within the last 6 months ) Prior History Lung / other cancer no (Diagnosis within the last 5 years already requiring surveillance chest CT Scans). Smoking Status Current Smoker Former Smokers: Years since quit: NA  Quit Date: NA  Visit Components: Discussion included one or more decision making aids. yes Discussion included risk/benefits of screening. yes Discussion included potential follow up diagnostic testing for abnormal scans. yes Discussion included meaning and risk of over diagnosis. yes Discussion included meaning and risk of False Positives. yes Discussion included meaning of total radiation exposure. yes  Counseling Included: Importance of adherence to annual lung cancer LDCT screening. yes Impact of comorbidities on ability to participate in the program. yes Ability and willingness to under diagnostic treatment. yes  Smoking Cessation Counseling: Current Smokers:  Discussed importance of smoking cessation. yes Information about tobacco cessation classes and interventions  provided to patient. yes Patient provided with "ticket" for LDCT Scan. yes Symptomatic Patient. no  Counseling NA Diagnosis Code: Tobacco Use Z72.0 Asymptomatic Patient yes  Counseling (Intermediate counseling: > three minutes counseling) [id number] Former Smokers:  Discussed the importance of maintaining cigarette abstinence. yes Diagnosis Code: Personal History of Nicotine Dependence. [id number] Information about tobacco cessation classes and interventions provided to patient. Yes Patient provided with "ticket" for LDCT Scan. yes Written Order for Lung Cancer Screening with LDCT placed in Epic. Yes (CT Chest Lung Cancer Screening Low Dose W/O CM) [id number] Z12.2-Screening of respiratory organs Z87.891-Personal history of nicotine dependence  I have spent 25 minutes of face to face time with Caitlyn Lawrence discussing the risks and benefits of lung cancer screening. We viewed a power point together that explained in detail the above noted topics. We paused at intervals to allow for questions to be asked and answered to ensure understanding.We discussed that the single most powerful action that she can take to decrease her risk of developing lung cancer is to quit smoking. We discussed whether or not she is ready to commit to setting a quit date. We discussed options for tools to aid in quitting smoking including nicotine replacement therapy, non-nicotine medications, support groups, Quit Smart classes, and behavior modification. We discussed that often times setting smaller, more achievable goals, such as eliminating 1 cigarette a day for a week and then 2 cigarettes a day for a week can be helpful in slowly decreasing the number of cigarettes smoked. This allows for a sense of accomplishment as well as providing a clinical benefit. I gave her the " Be Stronger Than Your Excuses" card with contact information for community resources, classes, free nicotine replacement therapy, and access to mobile apps, text  messaging, and on-line smoking cessation help. I have also given her my card and contact information in the event she needs to contact me. We discussed the time and location of the scan, and that either Doroteo Glassman RN or I will call with the results within 24-48 hours of receiving them. I have offered her  a copy of the power point we viewed  as a resource in the event they need reinforcement of the concepts we discussed today in the office. The patient verbalized understanding of all of  the above and had no further questions upon leaving the office. They have my contact information in the event they have any further questions.  I spent 3 minutes counseling on smoking cessation and the health risks of continued tobacco abuse.  I explained to the patient that there has been a high incidence of coronary artery disease noted on these exams. I explained that this is a non-gated exam therefore degree or severity cannot be determined. This patient is not on statin therapy. I have asked the patient to follow-up with their PCP regarding any incidental finding of coronary artery disease and management with diet or medication as their PCP  feels is clinically indicated. The patient verbalized understanding of the above and had no further questions upon completion of the visit.      Magdalen Spatz, NP 08/28/2021

## 2021-08-28 NOTE — Patient Instructions (Signed)
Thank you for participating in the [facility] Cancer Screening Program. It was our pleasure to meet you today. We will call you with the results of your scan within the next few days. Your scan will be assigned a Lung RADS category score by the physicians reading the scans.  This Lung RADS score determines follow up scanning.  See below for description of categories, and follow up screening recommendations. We will be in touch to schedule your follow up screening annually or based on recommendations of our providers. We will fax a copy of your scan results to your Primary Care Physician, or the physician who referred you to the program, to ensure they have the results. Please call the office if you have any questions or concerns regarding your scanning experience or results.  Our office number is [phone]. Please speak with Denise Phelps, RN. She is our Lung Cancer Screening RN. If she is unavailable when you call, please have the office staff send her a message. She will return your call at her earliest convenience. Remember, if your scan is normal, we will scan you annually as long as you continue to meet the criteria for the program. (Age 55-77, Current smoker or smoker who has quit within the last 15 years). If you are a smoker, remember, quitting is the single most powerful action that you can take to decrease your risk of lung cancer and other pulmonary, breathing related problems. We know quitting is hard, and we are here to help.  Please let us know if there is anything we can do to help you meet your goal of quitting. If you are a former smoker, congratulations. We are proud of you! Remain smoke free! Remember you can refer friends or family members through the number above.  We will screen them to make sure they meet criteria for the program. Thank you for helping us take better care of you by participating in Lung Screening.  Lung RADS Categories:  Lung RADS 1: no nodules  or definitely non-concerning nodules.  Recommendation is for a repeat annual scan in 12 months.  Lung RADS 2:  nodules that are non-concerning in appearance and behavior with a very low likelihood of becoming an active cancer. Recommendation is for a repeat annual scan in 12 months.  Lung RADS 3: nodules that are probably non-concerning , includes nodules with a low likelihood of becoming an active cancer.  Recommendation is for a 6-month repeat screening scan. Often noted after an upper respiratory illness. We will be in touch to make sure you have no questions, and to schedule your 6-month scan.  Lung RADS 4 A: nodules with concerning findings, recommendation is most often for a follow up scan in 3 months or additional testing based on our provider's assessment of the scan. We will be in touch to make sure you have no questions and to schedule the recommended 3 month follow up scan.  Lung RADS 4 B:  indicates findings that are concerning. We will be in touch with you to schedule additional diagnostic testing based on our provider's  assessment of the scan.   

## 2021-09-02 ENCOUNTER — Other Ambulatory Visit: Payer: Self-pay

## 2021-09-02 ENCOUNTER — Ambulatory Visit (INDEPENDENT_AMBULATORY_CARE_PROVIDER_SITE_OTHER): Payer: 59

## 2021-09-02 ENCOUNTER — Ambulatory Visit (HOSPITAL_COMMUNITY)
Admission: EM | Admit: 2021-09-02 | Discharge: 2021-09-02 | Disposition: A | Discharge status: Home / Self Care | Payer: 59 | Attending: Family Medicine | Admitting: Family Medicine

## 2021-09-02 ENCOUNTER — Encounter (HOSPITAL_COMMUNITY): Payer: Self-pay

## 2021-09-02 DIAGNOSIS — J441 Chronic obstructive pulmonary disease with (acute) exacerbation: Secondary | ICD-10-CM

## 2021-09-02 DIAGNOSIS — T23261D Burn of second degree of back of right hand, subsequent encounter: Secondary | ICD-10-CM | POA: Diagnosis not present

## 2021-09-02 DIAGNOSIS — R0602 Shortness of breath: Secondary | ICD-10-CM

## 2021-09-02 MED ORDER — DOXYCYCLINE HYCLATE 100 MG PO CAPS: 100.0000 mg | ORAL_CAPSULE | Freq: Two times a day (BID) | ORAL | 0 refills | Status: DC

## 2021-09-02 MED ORDER — PREDNISONE 20 MG PO TABS: 20.0000 mg | ORAL_TABLET | Freq: Every day | ORAL | 0 refills | Status: DC
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
## 2021-09-04 ENCOUNTER — Telehealth: Payer: Self-pay

## 2021-09-04 NOTE — Telephone Encounter (Signed)
Records reviewed, unable to locate a serial number for patient device.  Contacted patient.  She reports she misplaced her documentation about the loop recorder when she moved so she is unable to provide the device serial number.  Advised patient that her monitoring will continue to go to [facility] until her device is interrogated here and the serial number is obtained.

## 2021-09-04 NOTE — Telephone Encounter (Signed)
Copy of loop ID card made in clinic.  Will scan into chart.     Serial #: [id number]

## 2021-09-04 NOTE — Telephone Encounter (Signed)
-----   Message from Sanda Klein, MD sent at 09/04/2021  9:25 AM EDT ----- Regarding: RE: Loop Will ask the device clinic to transfer her follow up to [place]. Thanks! ----- Message ----- From: Silverio Lay, RN Sent: 09/03/2021   4:53 PM EDT To: Sanda Klein, MD Subject: Loop                                           Hello Dr. Sallyanne Kuster,  Dr. Gardiner Rhyme saw this patient 8/25 as a new patient.  She recently moved from [place] and has a loop (implanted 09/14/2020 for syncope).   They have been doing remote downloads (in Epic)-followed by [facility].   Would you be okay to follow her loop downloads?   Just trying to get her care transitioned since she has relocated.     Thank you! Hayley

## 2021-09-09 ENCOUNTER — Encounter: Payer: Self-pay | Admitting: Pulmonary Disease

## 2021-09-09 ENCOUNTER — Other Ambulatory Visit: Payer: Self-pay

## 2021-09-09 ENCOUNTER — Ambulatory Visit (INDEPENDENT_AMBULATORY_CARE_PROVIDER_SITE_OTHER): Payer: 59 | Admitting: Pulmonary Disease

## 2021-09-09 VITALS — BP 118/60 | HR 81 | Temp 98.1°F | Ht 64.0 in | Wt 231.6 lb

## 2021-09-09 DIAGNOSIS — K1379 Other lesions of oral mucosa: Secondary | ICD-10-CM | POA: Diagnosis not present

## 2021-09-09 DIAGNOSIS — J432 Centrilobular emphysema: Secondary | ICD-10-CM | POA: Diagnosis not present

## 2021-09-09 DIAGNOSIS — F17200 Nicotine dependence, unspecified, uncomplicated: Secondary | ICD-10-CM

## 2021-09-09 MED ORDER — IPRATROPIUM-ALBUTEROL 0.5-2.5 (3) MG/3ML IN SOLN: 3.0000 mL | Freq: Four times a day (QID) | RESPIRATORY_TRACT | 3 refills | Status: DC

## 2021-09-09 MED ORDER — COMBIVENT RESPIMAT 20-100 MCG/ACT IN AERS: 1.0000 | INHALATION_SPRAY | Freq: Four times a day (QID) | RESPIRATORY_TRACT | 5 refills | Status: DC

## 2021-09-09 MED ORDER — TRELEGY ELLIPTA 100-62.5-25 MCG/INH IN AEPB: 1.0000 | INHALATION_SPRAY | Freq: Every day | RESPIRATORY_TRACT | 5 refills | Status: DC

## 2021-09-09 NOTE — Progress Notes (Signed)
Subjective:   PATIENT ID: Caitlyn Lawrence GENDER: female DOB: 10/02/1946, MRN: [id number]   HPI  Chief Complaint  Patient presents with   Follow-up    COPD, currently 0.5 pack daily    Reason for Visit: Follow-up COPD  Caitlyn Lawrence is a 75 year old female active smoker with COPD, atrial fibrillation, DM2, s/p CABG in 2005 who presents for follow-up  Synopsis: She was referred by her PCP Dr. Johnney Ou and established with [facility] in 06/2021. She is currently on Combivent, Breo ellipta, albuterol and duonebs. She moved from [place], [place] to [place], [place] in 03/2021 and has worsening COPD. She was previously seen by Pulmonology in her hometown for at least 10 years.  At baseline, she has expiratory wheezing, shortness of breath at rest and exertion and chronic cough throughout the day. She is on Breo daily, combivent one puff twice a day, and albuterol nebulizer three times a day. Her activity is limited due to her symptoms. Her last exacerbation requiring steroids was spring 2021. Has not been hospitalized for her COPD. Symptoms are triggered by activity, pollen, heat. Cold air improves it.   She performs ADLs including showering on her own but she has to pace herself and can be difficult at times. She reports that she has gradually slowed down in activity in the last few years. Her son assists with laundry, cooking, housework. She moved in with her son three months ago in [place].  She is an active smoker and using 1/2-1ppd. She is trying to eliminate. Tried patch for one day and self discontinued.   09/09/21 Since our last visit in July 2022, she was seen in urgent care on 09/02/21 for COPD exacerbation with prednisone and doxycycline. She continues to smoke 1/2 ppd. She was started on Trelegy and feels it has improved her respiratory symptoms. She continues to have shortness of breath with exertion and wheezing. Cough has improved. She is able to perform  more activities in the house including making her bed, laundry and loading dishwasher. Weather worsens her symptoms. She does report a mouth sore appeared one week ago. She states she washes her mouth every time she uses her inhalers. She uses her combivent three times a day out of habit but notices she does fine if she misses a dose. She uses her duonebs maybe 3-4 times a week.  Social History: Active smoker. 60 pack-years   Past Medical History:  Diagnosis Date   Angiectasia    of colon and duodenum   CAD (coronary artery disease)    Candidal esophagitis (HCC)    COPD with acute exacerbation (HCC)    Diabetes ([place])    Diverticulosis    GI bleed    H. pylori infection 10/2017   Heart failure (HCC)    Hypertension    IDA (iron deficiency anemia)    Internal hemorrhoids    MI (myocardial infarction) (HCC)    Osteoarthritis    Pancreatic steatorrhea    Paroxysmal atrial fibrillation (HCC)    Tubular adenoma of colon     Allergies  Allergen Reactions   Sulfa Antibiotics Other (See Comments), Hives and Itching    Pt does not like how it makes her feel.  Other reaction(s): Other (See Comments) Pt does not like how it makes her feel.     Penicillins Hives and Itching     Outpatient Medications Prior to Visit  Medication Sig Dispense Refill   albuterol (ACCUNEB) 0.63 MG/3ML nebulizer solution  Take 3 mLs (0.63 mg total) by nebulization every 6 (six) hours as needed for wheezing or shortness of breath. 75 mL 3   amLODipine (NORVASC) 2.5 MG tablet Take 1 tablet by mouth daily.     Blood Glucose Monitoring Suppl (ONETOUCH VERIO) w/Device KIT 1 Units by Does not apply route 3 (three) times daily. 1 kit 0   BREO ELLIPTA 100-25 MCG/INH AEPB Inhale 1 puff into the lungs daily.     COMBIVENT RESPIMAT 20-100 MCG/ACT AERS respimat Inhale 1 puff into the lungs 4 (four) times daily.     doxycycline (VIBRAMYCIN) 100 MG capsule Take 1 capsule (100 mg total) by mouth 2 (two) times daily. 14  capsule 0   ferrous sulfate 325 (65 FE) MG tablet Take 1 tablet (325 mg total) by mouth every Monday, Wednesday, and Friday. 36 tablet 0   gabapentin (NEURONTIN) 100 MG capsule Take 200 mg by mouth 3 (three) times daily.     glucose blood (ONETOUCH VERIO) test strip 1 each by Other route 3 (three) times daily. Use as instructed 100 each 12   insulin degludec (TRESIBA FLEXTOUCH) 100 UNIT/ML FlexTouch Pen Inject 50 Units into the skin at bedtime. 15 mL 2   insulin lispro (HUMALOG KWIKPEN) 100 UNIT/ML KwikPen Inject 15 Units into the skin 3 (three) times daily with meals. 15 mL 11   Insulin Pen Needle 32G X 4 MM MISC Use to inject insulin 4 times a day 360 each 3   Insulin Syringe-Needle U-100 31G X 15/64" 0.5 ML MISC Please use with insulin 100 each 3   ipratropium-albuterol (DUONEB) 0.5-2.5 (3) MG/3ML SOLN Take 3 mLs by nebulization 4 (four) times daily. DX J44.1 360 mL 3   JARDIANCE 10 MG TABS tablet TAKE 1 TABLET BY MOUTH ONCE DAILY BEFORE BREAKFAST 90 tablet 0   Lancets MISC 1 Units by Does not apply route in the morning, at noon, and at bedtime. 100 each 2   lovastatin (MEVACOR) 20 MG tablet Take 20 mg by mouth every evening.     metoprolol succinate (TOPROL-XL) 100 MG 24 hr tablet Take 1 tablet (100 mg total) by mouth daily. Take with or immediately following a meal. 90 tablet 3   OneTouch Delica Lancets 69G MISC Please check your sugars 4x daily. 100 each 3   polyethylene glycol (MIRALAX / GLYCOLAX) 17 g packet Take 17 g by mouth as needed for mild constipation. 30 each 3   predniSONE (DELTASONE) 20 MG tablet Take 1 tablet (20 mg total) by mouth daily with breakfast. 5 tablet 0   rivaroxaban (XARELTO) 20 MG TABS tablet Take 1 tablet (20 mg total) by mouth every morning. 30 tablet 0   Vitamin D, Ergocalciferol, (DRISDOL) 1.25 MG (50000 UNIT) CAPS capsule Take 50,000 Units by mouth every 7 (seven) days. Mondays     No facility-administered medications prior to visit.    Review of Systems   Constitutional:  Negative for chills, diaphoresis, fever, malaise/fatigue and weight loss.  HENT:  Negative for congestion.   Respiratory:  Positive for cough and shortness of breath. Negative for hemoptysis, sputum production and wheezing.   Cardiovascular:  Negative for chest pain, palpitations and leg swelling.    Objective:   Vitals:   09/09/21 0859  BP: 118/60  Pulse: 81  Temp: 98.1 F (36.7 C)  TempSrc: Oral  SpO2: 98%  Weight: 231 lb 9.6 oz (105.1 kg)  Height: _0  (1.626 m)       Body mass index is  39.75 kg/m.  Physical Exam: General: Well-appearing, no acute distress HENT: [place], AT Eyes: EOMI, no scleral icterus Respiratory: Clear to auscultation bilaterally.  No crackles, wheezing or rales Cardiovascular: RRR, -M/R/G, no JVD Extremities:-Edema,-tenderness Neuro: AAO x4, CNII-XII grossly intact Psych: Normal mood, normal affect  Data Reviewed:  Imaging: CT Chest 08/08/1999 (report only) - Mild paraseptal emphysema CT Chest Lung Screen 08/28/21 - Mild centrilobular and paraseptal emphysema with upper lobe predominance. Lingular atelectasis/scarring  PFT: None on file  Labs: CBC    Component Value Date/Time   WBC 12.1 (H) 07/15/2021 1033   WBC 8.1 06/10/2021 1240   RBC 4.77 07/15/2021 1033   RBC 5.02 06/10/2021 1240   HGB 13.2 07/15/2021 1033   HCT 42.6 07/15/2021 1033   PLT 337 07/15/2021 1033   MCV 89 07/15/2021 1033   MCH 27.7 07/15/2021 1033   MCH 27.7 06/10/2021 1240   MCHC 31.0 (L) 07/15/2021 1033   MCHC 31.2 06/10/2021 1240   RDW 13.6 07/15/2021 1033   LYMPHSABS 0.7 04/08/2021 0925   MONOABS 0.6 04/08/2021 0925   EOSABS 0.2 04/08/2021 0925   BASOSABS 0.1 04/08/2021 0925   Absolute eosinophils 04/08/21 - 200     Assessment & Plan:   Discussion: 75 year old female active smoker with COPD, atrial fibrillation, diabetes, status post CABG in 2005 who presents for follow-up.  Has had 2 outpatient COPD exacerbations this year.  No  hospitalizations related to COPD.  On her last visit she was stepped up to ICS/LABA/LAMA. Discussed management of her chronic lung conditions compliance with bronchodilators with tobacco cessation  COPD with emphysema --CONTINUE Trelegy 200-62.5-25 mcg ONE puff ONCE a day. REFILL --CONTINUE Duonebs nebulizer treatment AS NEEDED up to  4 times a day for shortness of breath or wheezing. REFILL --CONTINUE Combivent ONE puff AS NEEDEDfor shortness of breath or wheezing. REFILL --Discuss Pulmonary Rehab  COPD exacerbation - completed --Completed prednisone  --Usually needs lower dose due to hx hyperglycemia  Mouth sore --Low suspicion this is related to inhaler, improving without intervention --Rinse mouth after every inhaler use --Continue to monitor. If it worsens, please contact your PCP  Tobacco abuse Patient is an active smoker. We discussed smoking cessation for 5 minutes. We discussed triggers and stressors and ways to deal with them. We discussed barriers to continued smoking and benefits of smoking cessation. Provided patient with information cessation techniques and interventions including [place] quitline.  Health Maintenance Immunization History  Administered Date(s) Administered   Influenza, High Dose Seasonal PF 10/12/2012, 11/14/2013, 11/20/2014, 11/13/2015, 10/18/2016, 09/09/2017, 09/29/2018, 10/26/2019, 10/18/2020   Moderna Sars-Covid-2 Vaccination 01/19/2020, 03/05/2020, 11/09/2020   Pneumococcal Conjugate-13 09/09/2017   Pneumococcal Polysaccharide-23 10/19/2019   CT Lung Screen - 08/2022  No orders of the defined types were placed in this encounter.  Meds ordered this encounter  Medications   ipratropium-albuterol (DUONEB) 0.5-2.5 (3) MG/3ML SOLN    Sig: Take 3 mLs by nebulization 4 (four) times daily. DX J44.1    Dispense:  360 mL    Refill:  3    DX J44.1   COMBIVENT RESPIMAT 20-100 MCG/ACT AERS respimat    Sig: Inhale 1 puff into the lungs 4 (four) times daily.     Dispense:  4 g    Refill:  5   Fluticasone-Umeclidin-Vilant (TRELEGY ELLIPTA) 100-62.5-25 MCG/INH AEPB    Sig: Inhale 1 puff into the lungs daily.    Dispense:  60 each    Refill:  5    Return in about 6 months (around 03/09/2022).  I have spent a total time of 32-minutes on the day of the appointment reviewing prior documentation, coordinating care and discussing medical diagnosis and plan with the patient/family. Past medical history, allergies, medications were reviewed. Pertinent imaging, labs and tests included in this note have been reviewed and interpreted independently by me.   [place], MD [facility] 09/09/2021 8:56 AM  Office Number [phone]

## 2021-09-09 NOTE — Patient Instructions (Addendum)
COPD with emphysema --CONTINUE Trelegy 200-62.5-25 mcg ONE puff ONCE a day. REFILL --CONTINUE Duonebs nebulizer treatment AS NEEDED up to  4 times a day for shortness of breath or wheezing. REFILL --CONTINUE Combivent ONE puff AS NEEDEDfor shortness of breath or wheezing. REFILL  Mouth sore --Low suspicion this is related to inhaler, improving without intervention --Rinse mouth after every inhaler use --Continue to monitor. If it worsens, please contact your PCP  Tobacco abuse QUIT SMOKING!  Follow-up with me in 6 month

## 2021-09-10 ENCOUNTER — Encounter: Payer: Self-pay | Admitting: Internal Medicine

## 2021-09-10 ENCOUNTER — Ambulatory Visit (INDEPENDENT_AMBULATORY_CARE_PROVIDER_SITE_OTHER): Payer: 59 | Admitting: Internal Medicine

## 2021-09-10 ENCOUNTER — Other Ambulatory Visit: Payer: Self-pay

## 2021-09-10 DIAGNOSIS — Z0000 Encounter for general adult medical examination without abnormal findings: Secondary | ICD-10-CM | POA: Diagnosis not present

## 2021-09-10 DIAGNOSIS — T23121A Burn of first degree of single right finger (nail) except thumb, initial encounter: Secondary | ICD-10-CM | POA: Diagnosis not present

## 2021-09-10 DIAGNOSIS — J441 Chronic obstructive pulmonary disease with (acute) exacerbation: Secondary | ICD-10-CM | POA: Diagnosis not present

## 2021-09-10 DIAGNOSIS — K1379 Other lesions of oral mucosa: Secondary | ICD-10-CM | POA: Diagnosis not present

## 2021-09-10 MED ORDER — DEXAMETHASONE 0.5 MG/5ML PO ELIX: 4.0000 mg | ORAL_SOLUTION | Freq: Every day | ORAL | 0 refills | Status: DC

## 2021-09-10 MED ORDER — LIDOCAINE VISCOUS HCL 2 % MT SOLN: 15.0000 mL | OROMUCOSAL | 0 refills | Status: DC | PRN

## 2021-09-10 NOTE — Assessment & Plan Note (Signed)
Flu shot administered today. 
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
## 2021-09-10 NOTE — Assessment & Plan Note (Signed)
Patient was evaluated in the emergency department on September 5.  Per notes at that time she was complaining of shortness of breath cough congestion.  O2 sat at home was 90% and chest x-ray was stable.  She was treated for a COPD exacerbation with prednisone for 5 days and Doxy for 1 week.  She follows with [facility] pulmonology and was evaluated by them yesterday.  She reports today that she is feeling well.  Has noticed significant improvement since being started on Trelegy and.  She has a chronic cough and some shortness of breath at rest, but reports that she is at her baseline today.  She does not use home oxygen.  She is an active smoker.  We will continue with duo nebs Trelegy Ellipta and Combivent.  Counseled the patient on smoking cessation.  Continue to follow with pulmonology.  Flu shot administered today as well.
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
## 2021-09-10 NOTE — Assessment & Plan Note (Signed)
Patient reports a progressively enlarging painful sore on her external lower gingiva that she noticed roughly 2 weeks ago.  It is painful and has limited her ability to eat.  She was recently evaluated for this by her pulmonologist but it was not thought to be related to her inhaler.  She routinely rinses out her mouth after using her inhalers.  On physical exam it is a raised white lesion on the external surface of the lower gingiva with some ulceration.  It is exquisitely tender to palpation.  Although, she does have a history of tobacco use we will treat it like an aphthous ulcer for now.  She has been prescribed a course of viscous lidocaine to swish and spit as well as dexamethasone elixir swish and spit to help with pain and healing.

## 2021-09-12 ENCOUNTER — Other Ambulatory Visit: Payer: Self-pay | Admitting: *Deleted

## 2021-09-12 DIAGNOSIS — F17210 Nicotine dependence, cigarettes, uncomplicated: Secondary | ICD-10-CM

## 2021-09-12 DIAGNOSIS — Z87891 Personal history of nicotine dependence: Secondary | ICD-10-CM

## 2021-09-16 NOTE — Progress Notes (Signed)
Internal Medicine Clinic Attending  I saw and evaluated the patient.  I personally confirmed the key portions of the history and exam documented by Dr. Elliot Gurney and I reviewed pertinent patient test results.  The assessment, diagnosis, and plan were formulated together and I agree with the documentation in the resident's note. P.S. prescription for topical steroid rinse will be corrected.

## 2021-09-19 ENCOUNTER — Other Ambulatory Visit (HOSPITAL_COMMUNITY): Payer: 59

## 2021-09-22 NOTE — Progress Notes (Signed)
Result Letter Sent by Doroteo Glassman RN 12 month follow up LDCT ordered Fax results report  sent to PCP  + CAD,on statin, followed by cards

## 2021-09-23 ENCOUNTER — Encounter: Payer: Self-pay | Admitting: Pulmonary Disease

## 2021-09-29 ENCOUNTER — Other Ambulatory Visit: Payer: Self-pay | Admitting: Student

## 2021-09-29 DIAGNOSIS — E119 Type 2 diabetes mellitus without complications: Secondary | ICD-10-CM

## 2021-09-29 DIAGNOSIS — Z794 Long term (current) use of insulin: Secondary | ICD-10-CM

## 2021-09-29 DIAGNOSIS — I509 Heart failure, unspecified: Secondary | ICD-10-CM

## 2021-09-30 ENCOUNTER — Other Ambulatory Visit: Payer: Self-pay | Admitting: *Deleted

## 2021-09-30 MED ORDER — RIVAROXABAN 20 MG PO TABS: 20.0000 mg | ORAL_TABLET | Freq: Every morning | ORAL | 0 refills | Status: DC

## 2021-10-08 ENCOUNTER — Other Ambulatory Visit: Payer: Self-pay

## 2021-10-08 ENCOUNTER — Ambulatory Visit (HOSPITAL_COMMUNITY): Payer: 59 | Attending: Cardiovascular Disease

## 2021-10-08 DIAGNOSIS — I10 Essential (primary) hypertension: Secondary | ICD-10-CM | POA: Insufficient documentation

## 2021-10-08 DIAGNOSIS — I2510 Atherosclerotic heart disease of native coronary artery without angina pectoris: Secondary | ICD-10-CM | POA: Insufficient documentation

## 2021-10-08 DIAGNOSIS — I480 Paroxysmal atrial fibrillation: Secondary | ICD-10-CM | POA: Diagnosis present

## 2021-10-08 LAB — ECHOCARDIOGRAM COMPLETE
Area-P 1/2: 3.31 cm2
S' Lateral: 3 cm

## 2021-10-15 ENCOUNTER — Ambulatory Visit (HOSPITAL_BASED_OUTPATIENT_CLINIC_OR_DEPARTMENT_OTHER): Payer: 59 | Admitting: Cardiovascular Disease

## 2021-10-28 ENCOUNTER — Other Ambulatory Visit: Payer: Self-pay | Admitting: Student

## 2021-10-29 ENCOUNTER — Other Ambulatory Visit: Payer: Self-pay | Admitting: Student

## 2021-10-29 MED ORDER — RIVAROXABAN 20 MG PO TABS: 20.0000 mg | ORAL_TABLET | Freq: Every morning | ORAL | 2 refills | Status: DC

## 2021-11-01 ENCOUNTER — Ambulatory Visit (INDEPENDENT_AMBULATORY_CARE_PROVIDER_SITE_OTHER): Payer: 59

## 2021-11-01 ENCOUNTER — Ambulatory Visit (INDEPENDENT_AMBULATORY_CARE_PROVIDER_SITE_OTHER): Payer: 59 | Admitting: Nurse Practitioner

## 2021-11-01 ENCOUNTER — Encounter: Payer: Self-pay | Admitting: Nurse Practitioner

## 2021-11-01 ENCOUNTER — Other Ambulatory Visit (INDEPENDENT_AMBULATORY_CARE_PROVIDER_SITE_OTHER): Payer: 59

## 2021-11-01 VITALS — BP 140/70 | HR 77 | Ht 64.0 in | Wt 230.0 lb

## 2021-11-01 DIAGNOSIS — K625 Hemorrhage of anus and rectum: Secondary | ICD-10-CM | POA: Diagnosis not present

## 2021-11-01 DIAGNOSIS — K5909 Other constipation: Secondary | ICD-10-CM

## 2021-11-01 DIAGNOSIS — D500 Iron deficiency anemia secondary to blood loss (chronic): Secondary | ICD-10-CM | POA: Diagnosis not present

## 2021-11-01 DIAGNOSIS — R55 Syncope and collapse: Secondary | ICD-10-CM

## 2021-11-01 LAB — CBC
HCT: 32.9 % — ABNORMAL LOW (ref 36.0–46.0)
Hemoglobin: 10 g/dL — ABNORMAL LOW (ref 12.0–15.0)
MCHC: 30.5 g/dL (ref 30.0–36.0)
MCV: 74.8 fl — ABNORMAL LOW (ref 78.0–100.0)
Platelets: 327 10*3/uL (ref 150.0–400.0)
RBC: 4.4 Mil/uL (ref 3.87–5.11)
RDW: 19.1 % — ABNORMAL HIGH (ref 11.5–15.5)
WBC: 8.5 10*3/uL (ref 4.0–10.5)

## 2021-11-01 NOTE — Progress Notes (Signed)
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  Colonoscopy with findings of angiectasia, status post APC, and colon polyps (adenomatous).  Here with isolated episode of painless rectal bleeding with bowel movement a few weeks ago.  No bleeding since.  Bleeding was most likely hemorrhoidal in setting of constipation   --- She received iron in the hospital and a second infusion.  Hemoglobin 13 in July.  She takes oral iron 3 times weekly .  -- Dark stool on DRE today ( on iron) -- Update her CBC today to make sure hemoglobin is still within normal range  #Chronic constipation, surely exacerbated by oral iron.  Patient says that she used to take MiraLAX on a daily basis when she lived in [place] but no one has prescribed it for her in [place].  We did recommend daily MiraLAX at the time of her last visit here in May 2022.  -- 1 capful of MiraLAX in 8 ounces of water daily -- If no improvement within a couple of weeks patient will call our office for further recommendations  #History of adenomatous colon polyps.  3 subcentimeter adenomas without HGD removed at time of colonoscopy April 2022. Surveillance colonoscopy would be in the 3 to 5-year range but may be deferred based on age.  Will discuss at the time draws closer.  Marland Kitchen  HISTORY OF PRESENT ILLNESS    Chief Complaint : episode of rectal bleeding a few weeks ago  Caitlyn Lawrence is a 75 y.o. female known to Dr. 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 See additional medical history as listed in PMH .   Patient relocated here from [place] several months ago.  She was hospitalized in [facility] April 2022  with symptomatic IDA and +FOBT  for which we saw her in consult.  She underwent upper and lower endoscopy with findings of small bowel and colonic angioectasias, status post APC ablation.  She got IV Fe in the hospital followed by another dose two weeks later.  Her last hemoglobin in July was 13.2.   When we saw her for hospital follow-up in May 2022 she was taking the iron every day but at some point since then it has been reduced to 3 times weekly.   Patient called 2-3 weeks ago for painless rectal bleeding x1 with a bowel movement. No episodes of bleeding since. She tries not to strain She has to take MOM to have a BM and takes it about about every 4 days. . She used to take Miralax (when lived in [place]) which worked but no one prescribed it since being in [place] / PERTINENT STUDIES:   April 2022 EGD and colonoscopy for GIB and anemia.  Four colonic angioectasias. Injected and then ablated with argon plasma coagulation (APC). A single colonic angioectasia. Injected and then ablated with argon plasma coagulation APC). One 8 mm polyp in the ascending colon, removed with a cold snare. Resected and retrieved.One 4 mm polyp in the transverse colon, removed with a cold snare. Resected and retrieved. One 5 mm polyp in the sigmoid colon, removed with a cold snare. Resected and retrieved.  Diverticulosis in the sigmoid colon and in the distal descending colon. The distal rectum and anal verge are normal on retroflexion view.  FINAL MICROSCOPIC DIAGNOSIS:   A. COLON, ASCENDING, TRANSVERSE AND SIGMOID, POLYPECTOMY:  -  Multiple fragments of tubular adenoma(s)  -  No high-grade dysplasia or malignancy identified  EGD - Esophageal plaques were found, consistent with candidiasis. - Normal stomach. - Nine angioectasias in the duodenum. Treated with argon plasma coagulation (APC). - No specimens collected.   Current Medications, Allergies, Past Medical History, Past  Surgical History, Family History and Social History were reviewed in [employer] record.     Current Outpatient Medications  Medication Sig Dispense Refill   albuterol (ACCUNEB) 0.63 MG/3ML nebulizer solution Take 3 mLs (0.63 mg total) by nebulization every 6 (six) hours as needed for wheezing or shortness of breath. 75 mL 3   amLODipine (NORVASC) 2.5 MG tablet Take 1 tablet by mouth daily.     Blood Glucose Monitoring Suppl (ONETOUCH VERIO) w/Device KIT 1 Units by Does not apply route 3 (three) times daily. 1 kit 0   COMBIVENT RESPIMAT 20-100 MCG/ACT AERS respimat Inhale 1 puff into the lungs 4 (four) times daily. 4 g 5   dexamethasone 0.5 MG/5ML elixir Take 40 mLs (4 mg total) by mouth daily. 100 mL 0   ferrous sulfate 325 (65 FE) MG tablet Take 1 tablet (325 mg total) by mouth every Monday, Wednesday, and Friday. 36 tablet 0   Fluticasone-Umeclidin-Vilant (TRELEGY ELLIPTA) 100-62.5-25 MCG/INH AEPB Inhale 1 puff into the lungs daily. 60 each 5   gabapentin (NEURONTIN) 100 MG capsule Take 200 mg by mouth 3 (three) times daily.     glucose blood (ONETOUCH VERIO) test strip 1 each by Other route 3 (three) times daily. Use as instructed 100 each 12   insulin degludec (TRESIBA FLEXTOUCH) 100 UNIT/ML FlexTouch Pen Inject 50 Units into the skin at bedtime. 15 mL 2   insulin lispro (HUMALOG KWIKPEN) 100 UNIT/ML KwikPen Inject 15 Units into the skin 3 (three) times daily with meals. 15 mL 11   Insulin Pen Needle 32G X 4 MM MISC Use to inject insulin 4 times a day 360 each 3   Insulin Syringe-Needle U-100 31G X 15/64" 0.5 ML MISC Please use with insulin 100 each 3   ipratropium-albuterol (DUONEB) 0.5-2.5 (3) MG/3ML SOLN Take 3 mLs by nebulization 4 (four) times daily. DX J44.1 360 mL 3   JARDIANCE 10 MG TABS tablet TAKE 1 TABLET BY MOUTH ONCE DAILY BEFORE BREAKFAST 90 tablet 0   Lancets MISC 1 Units by Does not apply route in the morning, at noon, and at bedtime. 100 each 2    lidocaine (XYLOCAINE) 2 % solution Use as directed 15 mLs in the mouth or throat as needed for mouth pain. 400 mL 0   lovastatin (MEVACOR) 20 MG tablet Take 20 mg by mouth every evening.     metoprolol succinate (TOPROL-XL) 100 MG 24 hr tablet Take 1 tablet (100 mg total) by mouth daily. Take with or immediately following a meal. 90 tablet 3   OneTouch Delica Lancets 16X MISC Please check your sugars 4x daily. 100 each 3   polyethylene glycol (MIRALAX / GLYCOLAX) 17 g packet Take 17 g by mouth as needed for mild constipation. 30 each 3   rivaroxaban (XARELTO) 20 MG TABS tablet Take 1 tablet (20 mg total) by mouth every morning. 30 tablet 2   Vitamin D, Ergocalciferol, (DRISDOL) 1.25 MG (50000 UNIT) CAPS  capsule Take 50,000 Units by mouth every 7 (seven) days. Mondays     No current facility-administered medications for this visit.    Review of Systems: No chest pain. No shortness of breath. No urinary complaints.   PHYSICAL EXAM :    Wt Readings from Last 3 Encounters:  11/01/21 230 lb (104.3 kg)  09/10/21 233 lb 3.2 oz (105.8 kg)  09/09/21 231 lb 9.6 oz (105.1 kg)    BP 140/70   Pulse 77   Ht 5' 4"  (1.626 m)   Wt 230 lb (104.3 kg)   BMI 39.48 kg/m  Constitutional:  Obese female in no acute distress. Psychiatric: Very pleasant. Normal mood and affect. Behavior is normal. EENT: Pupils normal.  Conjunctivae are normal. No scleral icterus. Neck supple.  Cardiovascular: Normal rate. No edema Pulmonary/chest: Effort normal and breath sounds normal. No wheezing, rales or rhonchi. Abdominal: Limited exam, she had a difficult time laying flat on the table . Abdomen soft, nondistended, nontender. Bowel sounds active .  Rectal:  Small amount of dark stool on DRE. No pain on DRE speaking against a fissure Neurological: Alert and oriented to person place and time. Skin: Skin is warm and dry. No rashes noted.  Tye Savoy, NP  11/01/2021, 11:44 AM

## 2021-11-01 NOTE — Patient Instructions (Signed)
If you are age 75 or older, your body mass index should be between 23-30. Your Body mass index is 39.48 kg/m. If this is out of the aforementioned range listed, please consider follow up with your Primary Care Provider.  RECOMMENDATIONS: Miralax- Dissolve one capful in 8 ounces of water and drink once a day.  Please call us in a couple of weeks if still constipated.  LABS:  Lab work has been ordered for you today. Our lab is located in the basement. Press "B" on the elevator. The lab is located at the first door on the left as you exit the elevator.  HEALTHCARE LAWS AND MY CHART RESULTS: Due to recent changes in healthcare laws, you may see the results of your imaging and laboratory studies on MyChart before your provider has had a chance to review them.   We understand that in some cases there may be results that are confusing or concerning to you. Not all laboratory results come back in the same time frame and the provider may be waiting for multiple results in order to interpret others.  Please give [place] 48 hours in order for your provider to thoroughly review all the results before contacting the office for clarification of your results.   It was great seeing you today! Thank you for entrusting me with your care and choosing [facility].  Tye Savoy, NP

## 2021-11-03 LAB — CUP PACEART REMOTE DEVICE CHECK: Date Time Interrogation Session: 20221103230900

## 2021-11-05 ENCOUNTER — Encounter: Payer: Self-pay | Admitting: *Deleted

## 2021-11-05 NOTE — Progress Notes (Unsigned)

## 2021-11-05 NOTE — Progress Notes (Signed)
Addendum: Reviewed and agree with assessment and management plan. See my comment attached to her recent lab work, plan is for VCE Dedrick Heffner, Lajuan Lines, MD

## 2021-11-06 NOTE — Progress Notes (Signed)
Carelink Summary Report / Loop Recorder 

## 2021-11-07 ENCOUNTER — Other Ambulatory Visit: Payer: Self-pay

## 2021-11-07 DIAGNOSIS — D500 Iron deficiency anemia secondary to blood loss (chronic): Secondary | ICD-10-CM

## 2021-11-07 NOTE — Progress Notes (Signed)
Beth, please have her come in for ferritin, TIBC and lets repeat the CBC. IF she is iron deficient then will recommended IV iron ( will wait for those results).  In the meantime please arrange for video capsule study. Thanks

## 2021-11-08 ENCOUNTER — Other Ambulatory Visit (INDEPENDENT_AMBULATORY_CARE_PROVIDER_SITE_OTHER): Payer: 59

## 2021-11-08 ENCOUNTER — Other Ambulatory Visit: Payer: Self-pay

## 2021-11-08 DIAGNOSIS — D500 Iron deficiency anemia secondary to blood loss (chronic): Secondary | ICD-10-CM | POA: Diagnosis not present

## 2021-11-08 LAB — CBC WITH DIFFERENTIAL/PLATELET
Basophils Absolute: 0.1 10*3/uL (ref 0.0–0.1)
Basophils Relative: 1.7 % (ref 0.0–3.0)
Eosinophils Absolute: 0.3 10*3/uL (ref 0.0–0.7)
Eosinophils Relative: 4 % (ref 0.0–5.0)
HCT: 33.3 % — ABNORMAL LOW (ref 36.0–46.0)
Hemoglobin: 10 g/dL — ABNORMAL LOW (ref 12.0–15.0)
Lymphocytes Relative: 16.6 % (ref 12.0–46.0)
Lymphs Abs: 1.1 10*3/uL (ref 0.7–4.0)
MCHC: 30.1 g/dL (ref 30.0–36.0)
MCV: 74 fl — ABNORMAL LOW (ref 78.0–100.0)
Monocytes Absolute: 0.8 10*3/uL (ref 0.1–1.0)
Monocytes Relative: 12.1 % — ABNORMAL HIGH (ref 3.0–12.0)
Neutro Abs: 4.3 10*3/uL (ref 1.4–7.7)
Neutrophils Relative %: 65.6 % (ref 43.0–77.0)
Platelets: 315 10*3/uL (ref 150.0–400.0)
RBC: 4.5 Mil/uL (ref 3.87–5.11)
RDW: 18.7 % — ABNORMAL HIGH (ref 11.5–15.5)
WBC: 6.6 10*3/uL (ref 4.0–10.5)

## 2021-11-08 LAB — FERRITIN: Ferritin: 18.5 ng/mL (ref 10.0–291.0)

## 2021-11-09 LAB — IRON AND TIBC
Iron Saturation: 3 % — CL (ref 15–55)
Iron: 12 ug/dL — ABNORMAL LOW (ref 27–139)
Total Iron Binding Capacity: 475 ug/dL — ABNORMAL HIGH (ref 250–450)
UIBC: 463 ug/dL — ABNORMAL HIGH (ref 118–369)

## 2021-11-13 ENCOUNTER — Other Ambulatory Visit: Payer: Self-pay | Admitting: Student

## 2021-11-13 ENCOUNTER — Other Ambulatory Visit: Payer: Self-pay

## 2021-11-13 DIAGNOSIS — D500 Iron deficiency anemia secondary to blood loss (chronic): Secondary | ICD-10-CM

## 2021-11-18 ENCOUNTER — Other Ambulatory Visit: Payer: Self-pay

## 2021-11-18 ENCOUNTER — Ambulatory Visit (INDEPENDENT_AMBULATORY_CARE_PROVIDER_SITE_OTHER): Payer: 59 | Admitting: Student

## 2021-11-18 ENCOUNTER — Encounter: Payer: Self-pay | Admitting: Dietician

## 2021-11-18 ENCOUNTER — Encounter: Payer: Self-pay | Admitting: Student

## 2021-11-18 ENCOUNTER — Ambulatory Visit (INDEPENDENT_AMBULATORY_CARE_PROVIDER_SITE_OTHER): Payer: 59 | Admitting: Dietician

## 2021-11-18 VITALS — BP 137/69 | HR 81 | Temp 98.2°F | Resp 24 | Ht 64.0 in | Wt 232.0 lb

## 2021-11-18 DIAGNOSIS — Z794 Long term (current) use of insulin: Secondary | ICD-10-CM

## 2021-11-18 DIAGNOSIS — J441 Chronic obstructive pulmonary disease with (acute) exacerbation: Secondary | ICD-10-CM

## 2021-11-18 DIAGNOSIS — Z720 Tobacco use: Secondary | ICD-10-CM | POA: Diagnosis not present

## 2021-11-18 DIAGNOSIS — E119 Type 2 diabetes mellitus without complications: Secondary | ICD-10-CM

## 2021-11-18 DIAGNOSIS — I159 Secondary hypertension, unspecified: Secondary | ICD-10-CM

## 2021-11-18 DIAGNOSIS — L409 Psoriasis, unspecified: Secondary | ICD-10-CM

## 2021-11-18 DIAGNOSIS — D500 Iron deficiency anemia secondary to blood loss (chronic): Secondary | ICD-10-CM

## 2021-11-18 LAB — POCT GLYCOSYLATED HEMOGLOBIN (HGB A1C): Hemoglobin A1C: 6 % — AB (ref 4.0–5.6)

## 2021-11-18 LAB — GLUCOSE, CAPILLARY: Glucose-Capillary: 99 mg/dL (ref 70–99)

## 2021-11-18 MED ORDER — TRESIBA FLEXTOUCH 100 UNIT/ML ~~LOC~~ SOPN
50.0000 [IU] | PEN_INJECTOR | Freq: Every day | SUBCUTANEOUS | 2 refills | Status: AC
Start: 2021-11-18 — End: 2022-02-16

## 2021-11-18 MED ORDER — CLOBETASOL PROPIONATE 0.05 % EX SHAM: MEDICATED_SHAMPOO | CUTANEOUS | 0 refills | Status: AC

## 2021-11-18 MED ORDER — LOSARTAN POTASSIUM 100 MG PO TABS: 100.0000 mg | ORAL_TABLET | Freq: Every day | ORAL | 3 refills | Status: DC

## 2021-11-18 NOTE — Patient Instructions (Addendum)
Thank you, Ms.Ardath Raoul Pitch for allowing [place] to provide your care today. Today we discussed .  Diabetes You are doing great.  Please stop taking your mealtime insulin.  That also means you no longer need to check your blood sugars at that time.  Please only check your blood sugars in the mornings and continue your nightly insulin  Low Iron/GI Bleeding Please follow-up with gastroenterology and follow-up with their instructions over the next week.  High blood pressure Please restart taking your blood pressure medication losartan.  Please take this as well as her metoprolol.  If you find her self feeling lightheaded or dizzy while taking this please call our clinic and stop taking the losartan.  Smoking Please continue to work on stopping smoking.  If you need assistance please call clinic.  There are gums, patches and medications that we can assist you with this.  Rash on head   I am writing for the shampoo please only use it once daily  I have ordered the following labs for you:   Lab Orders         POC Hbg A1C      Referrals ordered today:   Referral Orders  No referral(s) requested today     I have ordered the following medication/changed the following medications:   Stop the following medications: There are no discontinued medications.   Start the following medications: No orders of the defined types were placed in this encounter.    Follow up: 3 months   Should you have any questions or concerns please call the internal medicine clinic at [phone].    Sanjuana Letters, D.O. [place]

## 2021-11-18 NOTE — Assessment & Plan Note (Signed)
Assessment: Improvement of A1c today from 6.2-6.0.  Current regimen of Tresiba 50 units at bedtime, Humalog 3 times daily, Jardiance 10 mg daily.  She denies episodes of hypoglycemia.  Recent adjustments were made during her last visit as she was having episodes of lows.  With this improvement of her A1c will plan to discontinue her mealtime insulin.  At this point in her life I believe her A1c goal is 8.0.  Plan: -Discontinue mealtime insulin and mealtime glucose checks -Continue Tresiba 50 units daily and morning glucose checks, Jardiance 10 mg daily -Repeat A1c in 3 months

## 2021-11-18 NOTE — Assessment & Plan Note (Signed)
Assessment: Continues to do well on current inhaler therapy and to follow-up with pulmonology regularly.  Appreciate Dr. Cordelia Pen assistance in her care.  She continues to smoke.  We discussed smoking cessation for a good while today.  States she will continue to think about it.  Discussed to reach out to our clinic for any assistance she may need with quitting.  Plan: -Continue to follow with pulmonology -Continue inhalers per pulm -Continue smoking cessation

## 2021-11-18 NOTE — Assessment & Plan Note (Signed)
Assessment: Significant improvement for the past few months.  She continues to have itching of the area and there is some discoloration of the skin but the plaque has the most part resolved.  We will rewrite for steroid shampoo, to use once daily rather than twice daily as before.  Plan: -Continue steroid shampoo daily

## 2021-11-18 NOTE — Addendum Note (Signed)
Addended by: Riesa Pope on: 11/18/2021 02:37 PM   Modules accepted: Orders

## 2021-11-18 NOTE — Assessment & Plan Note (Signed)
Smoking cessation given today.  Patient instructed to reach out to clinic when she is ready to quit smoking.  Discussed risk and benefits of continuing to smoke, including worsening of her COPD

## 2021-11-18 NOTE — Progress Notes (Signed)
  Medical Nutrition Therapy:  Appt start time: 1210   end time:  1235 Total time:25 minutes Visit # 3  Assessment:  Primary concerns today: diabetes education Caitlyn Lawrence is here for a diabetes MNT follow up visit.  She has no questions about meal planning today. She feesl she is doing well. She is happy to use her meter and fogo using Continuous glucose monitoring espcailly now that she is only on insulin 1 time a day of long acting insulin. She says her son still cooks lower salt for her and sh buys all diet drinks. She states what she could change to make her intake healthier. She denies nay signs or symptoms of low blood sugar. Her A1C is improved and excellent.       Her goal is to have her breakfast readings a bit lower in the 100s rather than 150-180.   ANTHROPOMETRICS: Estimated body mass index is 39.82 kg/m as calculated from the following:   Height as of an earlier encounter on 11/18/21: 5\' 4"  (1.626 m).   Weight as of an earlier encounter on 11/18/21: 232 lb (105.2 kg).  WEIGHT HISTORY:  Wt Readings from Last 10 Encounters:  11/18/21 232 lb (105.2 kg)  11/01/21 230 lb (104.3 kg)  09/10/21 233 lb 3.2 oz (105.8 kg)  09/09/21 231 lb 9.6 oz (105.1 kg)  08/22/21 231 lb 9.6 oz (105.1 kg)  08/22/21 232 lb 12.8 oz (105.6 kg)  08/15/21 233 lb (105.7 kg)  07/15/21 233 lb 1.6 oz (105.7 kg)  07/09/21 231 lb (104.8 kg)  05/23/21 239 lb 4.8 oz (108.5 kg)    SLEEP:has trouble sleeping because of sleep apnea (she does not have a CPAP) and also gets up many times to urinate. Plans to address in 2023.   MEDICATIONS: per Ms Peeleclark  her Tyler Aas is 50 units at bedtime, jardiance 10 mg BLOOD SUGAR: METER DOWNLOAD  Average for past 30 days : 160, range is 100-233 with 36 total readings.  Lab Results  Component Value Date   HGBA1C 6.0 (A) 11/18/2021   HGBA1C 6.2 (A) 07/15/2021   HGBA1C 6.3 (H) 04/08/2021     DIETARY INTAKE: not done in detail today  PHYSICAL ACTIVITY: limited to  ADLs and some walking, she says she cannot stand for too long  Progress Towards Goal(s):  In progress.   Nutritional Diagnosis:  NB-1.1 Food and nutrition-related knowledge deficit As related to lack of recent diabetes training improving and patient satisfied with current level.  As evidenced by her report. .    Intervention:  Nutrition education about CGMs, what makes blood sugar go up and down.    Outcome goal: improved self monitoring and diabetes self management knowledge Coordination of care: recommend professional CGM  Teaching Method Utilized: Visual, Auditory,Hands on Handouts given during visit include:folder for CGM with food record,  After visit summary Barriers to learning/adherence to lifestyle change: competing values Demonstrated degree of understanding via:  Teach Back   Monitoring/Evaluation:  Dietary intake, exercise, meter, and body weight in 3 months Debera Lat, RD 11/18/2021 11:53 AM. .

## 2021-11-18 NOTE — Patient Instructions (Addendum)
Please follow up in 3 months- February 2023.   Consider moving snacks from your room for a week to see if that helps lower your morning blood sugars.  Call if you have signs or symptoms of low blood sugar- sweaty, dizzy, shaky, cold, clammy, hunger.  Butch Penny [phone]

## 2021-11-18 NOTE — Assessment & Plan Note (Addendum)
Assessment: Patient continued to have bright red blood per rectum at times and continue to have iron deficiency.  Patient plan for capsule GI study to be performed on Monday.  Patient given instructions on bowel prep as well as to hold aspirin.  No documentation noted about patient holding Xarelto, will reach out to patient's gastroenterologist to discuss further  Plan: -Continue for capsule study -Follow-up GI response  Addendum Appreciate Dr. Vena Rua assistance, patient to continue Xarelto

## 2021-11-18 NOTE — Progress Notes (Signed)
CC: Follow-up history of GI bleed, diabetes, high blood pressure, psoriasis of the head and tobacco use  HPI:  Caitlyn Lawrence is a 75 y.o. female with a past medical history stated below and presents today for discussion of chronic medical conditions of GI bleed, diabetes, hypertension, psoriasis of the head, tobacco use. Please see problem based assessment and plan for additional details.  Past Medical History:  Diagnosis Date   Angiectasia    of colon and duodenum   CAD (coronary artery disease)    Candidal esophagitis (HCC)    COPD with acute exacerbation ([place])    Diabetes ([place])    Diverticulosis    GI bleed    H. pylori infection 10/2017   Heart failure (HCC)    Hypertension    IDA (iron deficiency anemia)    Internal hemorrhoids    MI (myocardial infarction) ([place])    Osteoarthritis    Pancreatic steatorrhea    Paroxysmal atrial fibrillation (HCC)    Tubular adenoma of colon     Current Outpatient Medications on File Prior to Visit  Medication Sig Dispense Refill   albuterol (ACCUNEB) 0.63 MG/3ML nebulizer solution Take 3 mLs (0.63 mg total) by nebulization every 6 (six) hours as needed for wheezing or shortness of breath. 75 mL 3   amLODipine (NORVASC) 2.5 MG tablet Take 1 tablet by mouth daily.     Blood Glucose Monitoring Suppl (ONETOUCH VERIO) w/Device KIT 1 Units by Does not apply route 3 (three) times daily. 1 kit 0   COMBIVENT RESPIMAT 20-100 MCG/ACT AERS respimat Inhale 1 puff into the lungs 4 (four) times daily. 4 g 5   dexamethasone 0.5 MG/5ML elixir Take 40 mLs (4 mg total) by mouth daily. 100 mL 0   ferrous sulfate 325 (65 FE) MG tablet Take 1 tablet (325 mg total) by mouth every Monday, Wednesday, and Friday. 36 tablet 0   Fluticasone-Umeclidin-Vilant (TRELEGY ELLIPTA) 100-62.5-25 MCG/INH AEPB Inhale 1 puff into the lungs daily. 60 each 5   gabapentin (NEURONTIN) 100 MG capsule Take 200 mg by mouth 3 (three) times daily.     glucose blood (ONETOUCH  VERIO) test strip 1 each by Other route 3 (three) times daily. Use as instructed 100 each 12   insulin degludec (TRESIBA FLEXTOUCH) 100 UNIT/ML FlexTouch Pen Inject 50 Units into the skin at bedtime. 15 mL 2   insulin lispro (HUMALOG KWIKPEN) 100 UNIT/ML KwikPen Inject 15 Units into the skin 3 (three) times daily with meals. 15 mL 11   Insulin Pen Needle 32G X 4 MM MISC Use to inject insulin 4 times a day 360 each 3   Insulin Syringe-Needle U-100 31G X 15/64" 0.5 ML MISC Please use with insulin 100 each 3   ipratropium-albuterol (DUONEB) 0.5-2.5 (3) MG/3ML SOLN Take 3 mLs by nebulization 4 (four) times daily. DX J44.1 360 mL 3   JARDIANCE 10 MG TABS tablet TAKE 1 TABLET BY MOUTH ONCE DAILY BEFORE BREAKFAST 90 tablet 0   Lancets MISC 1 Units by Does not apply route in the morning, at noon, and at bedtime. 100 each 2   lidocaine (XYLOCAINE) 2 % solution Use as directed 15 mLs in the mouth or throat as needed for mouth pain. 400 mL 0   lovastatin (MEVACOR) 20 MG tablet Take 20 mg by mouth every evening.     metoprolol succinate (TOPROL-XL) 100 MG 24 hr tablet Take 1 tablet (100 mg total) by mouth daily. Take with or immediately following a meal.  90 tablet 3   OneTouch Delica Lancets 79J MISC Please check your sugars 4x daily. 100 each 3   polyethylene glycol (MIRALAX / GLYCOLAX) 17 g packet Take 17 g by mouth as needed for mild constipation. 30 each 3   rivaroxaban (XARELTO) 20 MG TABS tablet Take 1 tablet (20 mg total) by mouth every morning. 30 tablet 2   Vitamin D, Ergocalciferol, (DRISDOL) 1.25 MG (50000 UNIT) CAPS capsule Take 50,000 Units by mouth every 7 (seven) days. Mondays     No current facility-administered medications on file prior to visit.    Family History  Problem Relation Age of Onset   Aneurysm Mother    Uterine cancer Mother    Lung cancer Father    Diabetes Sister    Hypertension Sister    Alzheimer's disease Brother        Per pt he was beat to death at the nursing home    Ovarian cancer Daughter    Uterine cancer Maternal Aunt    Lung cancer Paternal Aunt     Social History   Socioeconomic History   Marital status: Single    Spouse name: Not on file   Number of children: 5   Years of education: Not on file   Highest education level: Not on file  Occupational History   Not on file  Tobacco Use   Smoking status: Every Day    Packs/day: 1.00    Years: 60.00    Pack years: 60.00    Types: Cigarettes   Smokeless tobacco: Former   Tobacco comments:    Currently smoking 0.5ppd as of 08/28/21  Vaping Use   Vaping Use: Never used  Substance and Sexual Activity   Alcohol use: Not Currently   Drug use: Never   Sexual activity: Not Currently  Other Topics Concern   Not on file  Social History Narrative   Not on file   Social Determinants of Health   Financial Resource Strain: Not on file  Food Insecurity: Not on file  Transportation Needs: Not on file  Physical Activity: Not on file  Stress: Not on file  Social Connections: Not on file  Intimate Partner Violence: Not on file    Review of Systems: ROS negative except for what is noted on the assessment and plan.  Vitals:   11/18/21 1034  BP: 137/69  Pulse: 81  Resp: (!) 24  Temp: 98.2 F (36.8 C)  TempSrc: Oral  SpO2: 100%  Weight: 232 lb (105.2 kg)  Height: 5' 4"  (1.626 m)     Physical Exam: Constitutional: Well-appearing, elderly, no acute distress HENT: normocephalic atraumatic, improvement of left-sided head plaque Eyes: conjunctiva non-erythematous Neck: supple Cardiovascular: regular rate and rhythm, no m/r/g Pulmonary/Chest: normal work of breathing on room air, decreased breath sounds bases bilaterally MSK: normal bulk and tone Neurological: alert & oriented x 3 Skin: warm and dry Psych: Normal mood and thought process  Patient refused foot exam   Assessment & Plan:   See Encounters Tab for problem based charting.  Patient discussed with Dr.  Newell Coral, D.O. [facility], PGY-2 Pager: [phone], Phone: [phone] Date 11/18/2021 Time 1:51 PM

## 2021-11-18 NOTE — Progress Notes (Signed)
Internal Medicine Clinic Attending  Case discussed with Dr. Johnney Ou  At the time of the visit.  We reviewed the resident's history and exam and pertinent patient test results.  I agree with the assessment, diagnosis, and plan of care documented in the resident's note, and particularly with the plan to stop meal coverage insulin; she is tightly controlled and this will simplify her regimen immensely.

## 2021-11-18 NOTE — Assessment & Plan Note (Signed)
Assessment: Current regimen of amlodipine 2.5 mg daily.  It appears as though losartan and Imdur were both discontinued for unknown reason.  I will reach back out to patient's cardiologist concerning this matter.  Patient states at home her blood pressures have been running 967 systolically.  We will go ahead and represcribe losartan  Plan: -Continue amlodipine 2.5 mg daily, represcribe losartan 100 mg daily -With patient's cardiologist about starting Imdur back

## 2021-11-19 ENCOUNTER — Telehealth: Payer: Self-pay | Admitting: Hematology and Oncology

## 2021-11-19 NOTE — Telephone Encounter (Signed)
Scheduled appt per 11/17 referral. Pt is aware of appt date and time.

## 2021-11-25 ENCOUNTER — Encounter: Payer: Self-pay | Admitting: Internal Medicine

## 2021-11-25 ENCOUNTER — Ambulatory Visit (INDEPENDENT_AMBULATORY_CARE_PROVIDER_SITE_OTHER): Payer: 59 | Admitting: Internal Medicine

## 2021-11-25 DIAGNOSIS — D500 Iron deficiency anemia secondary to blood loss (chronic): Secondary | ICD-10-CM | POA: Diagnosis not present

## 2021-11-25 DIAGNOSIS — K5520 Angiodysplasia of colon without hemorrhage: Secondary | ICD-10-CM | POA: Diagnosis not present

## 2021-11-25 NOTE — Progress Notes (Signed)
SN: [id number] Exp: 2023-01-28 LOT: [id number] Patient arrived for Capsule Endoscopy. Reported the prep went well. This nurse explained dietary restrictions for the next few hours. Patient verbalized understanding. Opened capsule, ensured capsule was flashing prior to the patient swallowing the capsule. Patient swallowed capsule without difficulty. Patient instructed to return to the office at 4:00 pm today for removal of the recording equipment, to call the office with any questions and if no capsule was visualized after 72 hours. No further questions by the conclusion of the visit.

## 2021-11-25 NOTE — Patient Instructions (Signed)

## 2021-11-27 ENCOUNTER — Telehealth: Payer: Self-pay | Admitting: Internal Medicine

## 2021-11-27 ENCOUNTER — Other Ambulatory Visit: Payer: Self-pay

## 2021-11-27 ENCOUNTER — Ambulatory Visit (INDEPENDENT_AMBULATORY_CARE_PROVIDER_SITE_OTHER): Payer: 59 | Admitting: Podiatry

## 2021-11-27 DIAGNOSIS — M79676 Pain in unspecified toe(s): Secondary | ICD-10-CM

## 2021-11-27 DIAGNOSIS — E0840 Diabetes mellitus due to underlying condition with diabetic neuropathy, unspecified: Secondary | ICD-10-CM

## 2021-11-27 DIAGNOSIS — B351 Tinea unguium: Secondary | ICD-10-CM

## 2021-11-27 DIAGNOSIS — Q828 Other specified congenital malformations of skin: Secondary | ICD-10-CM

## 2021-11-27 NOTE — Telephone Encounter (Signed)
Attempted to call pt, received message that voice mailbox has not been set up yet. Will try again.

## 2021-11-27 NOTE — Telephone Encounter (Signed)
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 When I saw her earlier this year the plan had been to continue oral iron replacement and only consider repeat endoscopy in the event that she had recurrent iron deficiency anemia.  She now has recurrent iron deficiency anemia and has been referred to Dr. Lorenso Courier for IV iron.  Please ensure that this referral is in place and the appointment has been facilitated.  I recommend that we proceed with single balloon enteroscopy in the outpatient hospital setting for further ablation of known angioectasias leading to iron deficiency anemia.  Single balloon would be with one of my partners, Dr. Bryan Lemma.  I will copy him on this note to see if he is agreeable.  Patient should begin pantoprazole 40 mg daily and remain on this as there is evidence of gastritis with a small, nonbleeding, gastric ulcer.  We will need permission to hold her Xarelto for the single balloon enteroscopy and we need to contact the prescribing provider to see if this is okay to hold 48 hours prior to procedure.  If patient agreeable, we will not need to repeat office visit prior to this procedure.  If she has any questions then we can discuss by phone or have her come back for a visit.  Thank you

## 2021-11-27 NOTE — Progress Notes (Signed)
  Subjective:  Patient ID: Caitlyn Lawrence, female    DOB: [birth year],  MRN: [id number]  Caitlyn Lawrence presents to clinic today for at risk foot care with history of diabetic neuropathy and painful elongated mycotic toenails 1-5 bilaterally which are tender when wearing enclosed shoe gear. Pain is relieved with periodic professional debridement.  Patient states blood glucose was 167 mg/dl this morning.   PCP is Riesa Pope, MD , and last visit was 11/18/2021.  Allergies  Allergen Reactions   Sulfa Antibiotics Other (See Comments), Hives and Itching    Pt does not like how it makes her feel.  Other reaction(s): Other (See Comments) Pt does not like how it makes her feel.     Penicillins Hives and Itching    Review of Systems: Negative except as noted in the HPI. Objective:   Constitutional Caitlyn Lawrence is a pleasant 75 y.o. African American female, in NAD. AAO x 3.   Vascular CFT immediate b/l LE. Faintly palpable DP/PT pulses b/l LE. Digital hair absent b/l. Skin temperature gradient WNL b/l. No pain with calf compression b/l. No edema noted b/l. No cyanosis or clubbing noted b/l LE.  Neurologic Normal speech. Oriented to person, place, and time. Protective sensation diminished with 10g monofilament b/l. Vibratory sensation diminished b/l.  Dermatologic Pedal integument with normal turgor, texture and tone b/l LE. No open wounds b/l. No interdigital macerations b/l. Toenails 1-5 b/l elongated, thickened, discolored with subungual debris. +Tenderness with dorsal palpation of nailplates. Porokeratotic lesion(s) noted left plantar midfoot and plantar aspect b/l heel pads.  Orthopedic: Normal muscle strength 5/5 to all lower extremity muscle groups bilaterally. HAV with bunion deformity noted b/l LE. Hammertoe deformity noted 2-5 b/l.Marland Kitchen No pain, crepitus or joint limitation noted with ROM b/l LE.  Patient ambulates independently without assistive aids.   Radiographs:  None  Last A1c:  Hemoglobin A1C Latest Ref Rng & Units 11/18/2021 07/15/2021 04/08/2021  HGBA1C 4.0 - 5.6 % 6.0(A) 6.2(A) 6.3(H)  Some recent data might be hidden    Assessment:   1. Pain due to onychomycosis of toenail   2. Porokeratosis   3. Diabetes mellitus due to underlying condition, controlled, with diabetic neuropathy, unspecified whether long term insulin use ([place])    Plan:  Patient was evaluated and treated and all questions answered. Consent given for treatment as described below: -Examined patient. -Continue foot and shoe inspections daily. Monitor blood glucose per PCP/Endocrinologist's recommendations. -Mycotic toenails 1-5 bilaterally were debrided in length and girth with sterile nail nippers and dremel without incident. -Painful porokeratotic lesion(s) lateral aspect of left midfoot and plantar aspect b/l heel pads pared and enucleated with sterile scalpel blade without incident. Total number of lesions debrided=3. -Patient/POA to call should there be question/concern in the interim.  Return in about 3 months (around 02/25/2022).  Marzetta Board, DPM

## 2021-11-28 ENCOUNTER — Encounter: Payer: Self-pay | Admitting: Podiatry

## 2021-12-02 ENCOUNTER — Ambulatory Visit (INDEPENDENT_AMBULATORY_CARE_PROVIDER_SITE_OTHER): Payer: 59

## 2021-12-02 ENCOUNTER — Inpatient Hospital Stay: Payer: 59 | Attending: Hematology and Oncology | Admitting: Hematology and Oncology

## 2021-12-02 ENCOUNTER — Inpatient Hospital Stay: Payer: 59

## 2021-12-02 DIAGNOSIS — D509 Iron deficiency anemia, unspecified: Secondary | ICD-10-CM | POA: Insufficient documentation

## 2021-12-02 DIAGNOSIS — R55 Syncope and collapse: Secondary | ICD-10-CM | POA: Diagnosis not present

## 2021-12-02 LAB — CUP PACEART REMOTE DEVICE CHECK: Date Time Interrogation Session: 20221204230846

## 2021-12-02 NOTE — Telephone Encounter (Signed)
Pt called back and is interested in scheduling the procedure. WIll call pt back with appt.

## 2021-12-04 ENCOUNTER — Telehealth: Payer: Self-pay

## 2021-12-04 ENCOUNTER — Other Ambulatory Visit: Payer: Self-pay

## 2021-12-04 ENCOUNTER — Telehealth: Payer: Self-pay | Admitting: *Deleted

## 2021-12-04 DIAGNOSIS — K5520 Angiodysplasia of colon without hemorrhage: Secondary | ICD-10-CM

## 2021-12-04 DIAGNOSIS — D500 Iron deficiency anemia secondary to blood loss (chronic): Secondary | ICD-10-CM

## 2021-12-04 NOTE — Telephone Encounter (Signed)
Dr. Hilarie Fredrickson the first slot Dr. Bryan Lemma has is 2/23, is this ok?

## 2021-12-04 NOTE — Telephone Encounter (Signed)
Pt scheduled for Single balloon enteroscopy at [facility] with Dr. Tracie Harrier 02/20/22@7 :30am. Pt will need to arrive there at [place]. Pt scheduled for previsit 01/31/22 at 2pm. Attempted to call pt with appt, received message that voicemail box has not been set up yet. Will try again. Pt is on Xarelto, will need to obtain clearance.

## 2021-12-04 NOTE — Telephone Encounter (Signed)
Letter has been sent to patient instructing them to call us if they are still interested in completing their sleep study. If we have not received a response from the patient within 30 days of this notice, the order will be cancelled and they will need to discuss the need for a sleep study at their next office visit.  ° °

## 2021-12-04 NOTE — Telephone Encounter (Signed)
Yes that is okay for the enteroscopy with Dr. Bryan Lemma --hematology which she has been referred to can follow iron levels and hemoglobin until this visit

## 2021-12-04 NOTE — Telephone Encounter (Signed)
Pt is scheduled for a single balloon enteroscopy at [facility] 02/20/21 with Dr. Bryan Lemma. It looks like her xarelto is prescribed by your office. Please advise is pt may hold the xarelto for 2 days prior to the procedure.l

## 2021-12-04 NOTE — Telephone Encounter (Signed)
Call transferred from the front office. Pt's requesting an appt for rectal bleeding. Pt stated this has been going on for many years on and off. Stated she noticed some "muddy" color blood in her stool yesterday. No bleeding today. Stated she call Dr Vena Rua office but cannot be seen until Feb. Stated she has no energy. Pt confirmed she's on Xarelto and takes her iron tabs 3 days a week. Appt schedule tomorrow @ 7902 AM with Dr Alfonse Spruce. Pt instructed to call 911 or go to the ED if bleeding increases, feels weaker or any changes in her condition befoe her appt tomorrow - stated she understands.

## 2021-12-05 ENCOUNTER — Ambulatory Visit (INDEPENDENT_AMBULATORY_CARE_PROVIDER_SITE_OTHER): Payer: 59 | Admitting: Student

## 2021-12-05 ENCOUNTER — Encounter: Payer: Self-pay | Admitting: Student

## 2021-12-05 ENCOUNTER — Other Ambulatory Visit: Payer: Self-pay

## 2021-12-05 VITALS — BP 131/48 | HR 79 | Temp 98.4°F | Ht 64.0 in | Wt 229.9 lb

## 2021-12-05 DIAGNOSIS — D500 Iron deficiency anemia secondary to blood loss (chronic): Secondary | ICD-10-CM

## 2021-12-05 DIAGNOSIS — Z8719 Personal history of other diseases of the digestive system: Secondary | ICD-10-CM | POA: Diagnosis not present

## 2021-12-05 LAB — CBC WITH DIFFERENTIAL/PLATELET
Abs Immature Granulocytes: 0.06 10*3/uL (ref 0.00–0.07)
Basophils Absolute: 0.1 10*3/uL (ref 0.0–0.1)
Basophils Relative: 1 %
Eosinophils Absolute: 0.4 10*3/uL (ref 0.0–0.5)
Eosinophils Relative: 4 %
HCT: 31.6 % — ABNORMAL LOW (ref 36.0–46.0)
Hemoglobin: 9.3 g/dL — ABNORMAL LOW (ref 12.0–15.0)
Immature Granulocytes: 1 %
Lymphocytes Relative: 13 %
Lymphs Abs: 1.2 10*3/uL (ref 0.7–4.0)
MCH: 21.5 pg — ABNORMAL LOW (ref 26.0–34.0)
MCHC: 29.4 g/dL — ABNORMAL LOW (ref 30.0–36.0)
MCV: 73 fL — ABNORMAL LOW (ref 80.0–100.0)
Monocytes Absolute: 0.9 10*3/uL (ref 0.1–1.0)
Monocytes Relative: 9 %
Neutro Abs: 6.8 10*3/uL (ref 1.7–7.7)
Neutrophils Relative %: 72 %
Platelets: 357 10*3/uL (ref 150–400)
RBC: 4.33 MIL/uL (ref 3.87–5.11)
RDW: 18.1 % — ABNORMAL HIGH (ref 11.5–15.5)
WBC: 9.3 10*3/uL (ref 4.0–10.5)
nRBC: 0 % (ref 0.0–0.2)

## 2021-12-05 LAB — FERRITIN: Ferritin: 6 ng/mL — ABNORMAL LOW (ref 11–307)

## 2021-12-05 MED ORDER — SODIUM CHLORIDE 0.9 % IV BOLUS
1000.0000 mL | Freq: Once | INTRAVENOUS | Status: AC
Start: 2021-12-05 — End: 2021-12-05
  Administered 2021-12-05: 1000 mL via INTRAVENOUS

## 2021-12-05 NOTE — Patient Instructions (Signed)
Caitlyn Lawrence,  It was a pleasure seeing you in the clinic today.  I am sorry that you still have persistent GI bleed and now symptoms of dizziness and lightheadedness.  Your hemoglobin checked here is slightly lower than your baseline but your iron is very low.  We will admit you to the hospital for further follow-up and blood transfusion.  Please be on the look for a call from the hospital for admission.  When you get to the hospital our team will take care of you.  If you develop worsening lightheadedness, dizziness, passing out, or worsening bleeding, please go to the ED or call 911.  Take care,  Dr. Alfonse Spruce

## 2021-12-05 NOTE — Assessment & Plan Note (Addendum)
Patient was admitted in April 2022 for GI bleed.  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  Colonoscopy showed 4 ectasia in the cecum and one in the ascending colon, all were treated with APC.  She received 1 unit of red blood cells, 2 doses of Feraheme and was started on oral ferrous sulfate.  She follow-up with her GI and had repeat blood work which showed hemoglobin of 10, ferritin of 18 and iron saturation of 3.  She underwent a capsule endoscopy on 11/28 which found more ectasia in the small intestine.  She was referred to Dr. Lorenso Courier with hematology for IV iron transfusion.  She is scheduled for a single balloon enteroscopy in February.  Today she reports ongoing melena and hematochezia.  States that this has been going on since her hospitalization.  She reports regular stool pattern, denies straining or tearing sensation when having a bowel forming.  She endorses worsening dizziness, lightheadedness but denies any fall.  She has stopped taking her Xarelto and iron supplement today.  Assessment and plan Initial blood pressure 127/42, repeat 131/48.  Much lower than her baseline.  Orthostatic vital sign was not conclusive but she was very symptomatic with positional change.  Rectal exam did not reveal any obvious external source of bleeding.  With her symptomatic anemia, we obtained stat CBC and ferritin.  If there is a significant drop in hemoglobin, will send her to the ED for blood transfusion.  If not, we can direct admit her.  -Will continue holding Xarelto  Addendum CBC showed hemoglobin of 9.3.  Ferritin is very low at 6.  She does not need urgent eval in the ED at this time.  She received 1 L bolus of normal saline which resulted in significant improvement of her orthostatic symptoms.  It is safe at this time to send her home while waiting for a bed placement.  Advised patient to go to the ED or call EMS if she develops worsening lightheaded, dizziness, syncope or worsening bleeding.   Patient verbalizes understanding.  Advised patient to hold Xarelto.

## 2021-12-05 NOTE — Addendum Note (Signed)
Addended by: Marcelino Duster on: 12/05/2021 12:03 PM   Modules accepted: Orders

## 2021-12-05 NOTE — Progress Notes (Signed)
   CC: Ongoing melena, hematochezia  HPI:  [name] is a 75 y.o. with past medical history of hypertension, CAD status post CABG, A. fib on Xarelto, COPD, type 2 diabetes who presents to the clinic today for ongoing melena and hematochezia.  She also report symptoms of dizziness, lightheadedness.  Please see problem based charting for detail  Past Medical History:  Diagnosis Date   Angiectasia    of colon and duodenum   CAD (coronary artery disease)    Candidal esophagitis (HCC)    COPD with acute exacerbation ([place])    Diabetes ([place])    Diverticulosis    GI bleed    H. pylori infection 10/2017   Heart failure (HCC)    Hypertension    IDA (iron deficiency anemia)    Internal hemorrhoids    MI (myocardial infarction) ([place])    Osteoarthritis    Pancreatic steatorrhea    Paroxysmal atrial fibrillation (HCC)    Tubular adenoma of colon    Review of Systems:  per HPI  Physical Exam:  Vitals:   12/05/21 1008 12/05/21 1009 12/05/21 1039  BP:  (!) 127/42 (!) 131/48  Pulse:  77 79  Temp:  98.4 F (36.9 C)   TempSrc:  Oral   SpO2:  98% 100%  Weight: 229 lb 14.4 oz (104.3 kg)    Height: 5\' 4"  (1.626 m)     Physical Exam Constitutional:      General: She is not in acute distress.    Appearance: She is not ill-appearing or toxic-appearing.  HENT:     Head: Normocephalic.     Ears:     Comments: Conjunctival pallor Eyes:     General:        Right eye: No discharge.        Left eye: No discharge.  Cardiovascular:     Rate and Rhythm: Normal rate and regular rhythm.  Pulmonary:     Effort: Pulmonary effort is normal. No respiratory distress.     Breath sounds: Normal breath sounds. No wheezing.  Abdominal:     General: Bowel sounds are normal. There is no distension.     Palpations: Abdomen is soft.     Tenderness: There is no abdominal tenderness.     Comments: Normal appearance of anus.  No obvious source of bleeding visualized.  No external hemorrhoids.   No fissure.  Musculoskeletal:        General: Normal range of motion.  Skin:    General: Skin is warm.  Neurological:     General: No focal deficit present.     Mental Status: She is alert and oriented to person, place, and time.  Psychiatric:        Mood and Affect: Mood normal.        Behavior: Behavior normal.     Assessment & Plan:   See Encounters Tab for problem based charting.  Patient discussed with Dr.  Saverio Danker

## 2021-12-05 NOTE — Addendum Note (Signed)
Addended by: Marcelino Duster on: 12/05/2021 12:05 PM   Modules accepted: Orders

## 2021-12-05 NOTE — Assessment & Plan Note (Addendum)
Patient have extensive history of GI bleed, dated back to 2012.  She was found to have an unusual amount of ectasia throughout her entire colon.   She denies history of epistaxis or abnormal bleeding in the past.  She denies family history of coagulopathy.  I wonder if she has hereditary hemorrhagic telangiectasia.  Her onset of bleeding is not consistent with a hereditary condition though.  If indicated, will consider work-up for this condition when stable.

## 2021-12-05 NOTE — Telephone Encounter (Signed)
See additional phone note stating ok to hold xarelto for 2 days.

## 2021-12-06 ENCOUNTER — Observation Stay (HOSPITAL_COMMUNITY)
Admission: AD | Admit: 2021-12-06 | Discharge: 2021-12-07 | Disposition: A | Discharge status: Home / Self Care | Payer: 59 | Source: Ambulatory Visit | Attending: Student in an Organized Health Care Education/Training Program | Admitting: Student in an Organized Health Care Education/Training Program

## 2021-12-06 ENCOUNTER — Telehealth: Payer: Self-pay | Admitting: *Deleted

## 2021-12-06 ENCOUNTER — Other Ambulatory Visit: Payer: Self-pay

## 2021-12-06 DIAGNOSIS — I480 Paroxysmal atrial fibrillation: Secondary | ICD-10-CM | POA: Diagnosis not present

## 2021-12-06 DIAGNOSIS — Z794 Long term (current) use of insulin: Secondary | ICD-10-CM | POA: Diagnosis not present

## 2021-12-06 DIAGNOSIS — H8110 Benign paroxysmal vertigo, unspecified ear: Secondary | ICD-10-CM | POA: Diagnosis not present

## 2021-12-06 DIAGNOSIS — F17210 Nicotine dependence, cigarettes, uncomplicated: Secondary | ICD-10-CM | POA: Insufficient documentation

## 2021-12-06 DIAGNOSIS — E119 Type 2 diabetes mellitus without complications: Secondary | ICD-10-CM | POA: Diagnosis not present

## 2021-12-06 DIAGNOSIS — J449 Chronic obstructive pulmonary disease, unspecified: Secondary | ICD-10-CM | POA: Insufficient documentation

## 2021-12-06 DIAGNOSIS — Z79899 Other long term (current) drug therapy: Secondary | ICD-10-CM | POA: Diagnosis not present

## 2021-12-06 DIAGNOSIS — I2510 Atherosclerotic heart disease of native coronary artery without angina pectoris: Secondary | ICD-10-CM | POA: Diagnosis not present

## 2021-12-06 DIAGNOSIS — K922 Gastrointestinal hemorrhage, unspecified: Principal | ICD-10-CM | POA: Insufficient documentation

## 2021-12-06 DIAGNOSIS — D509 Iron deficiency anemia, unspecified: Secondary | ICD-10-CM | POA: Insufficient documentation

## 2021-12-06 DIAGNOSIS — K5520 Angiodysplasia of colon without hemorrhage: Secondary | ICD-10-CM | POA: Diagnosis not present

## 2021-12-06 DIAGNOSIS — D500 Iron deficiency anemia secondary to blood loss (chronic): Secondary | ICD-10-CM | POA: Diagnosis not present

## 2021-12-06 DIAGNOSIS — D649 Anemia, unspecified: Secondary | ICD-10-CM | POA: Insufficient documentation

## 2021-12-06 LAB — COMPREHENSIVE METABOLIC PANEL
ALT: 14 U/L (ref 0–44)
AST: 18 U/L (ref 15–41)
Albumin: 3.6 g/dL (ref 3.5–5.0)
Alkaline Phosphatase: 83 U/L (ref 38–126)
Anion gap: 9 (ref 5–15)
BUN: 8 mg/dL (ref 8–23)
CO2: 24 mmol/L (ref 22–32)
Calcium: 9 mg/dL (ref 8.9–10.3)
Chloride: 100 mmol/L (ref 98–111)
Creatinine, Ser: 0.72 mg/dL (ref 0.44–1.00)
GFR, Estimated: >60 mL/min (ref >60)
Glucose, Bld: 158 mg/dL — ABNORMAL HIGH (ref 70–99)
Potassium: 4 mmol/L (ref 3.5–5.1)
Sodium: 133 mmol/L — ABNORMAL LOW (ref 135–145)
Total Bilirubin: 0.1 mg/dL — ABNORMAL LOW (ref 0.3–1.2)
Total Protein: 6.8 g/dL (ref 6.5–8.1)

## 2021-12-06 LAB — CBC WITH DIFFERENTIAL/PLATELET
Abs Immature Granulocytes: 0.04 10*3/uL (ref 0.00–0.07)
Basophils Absolute: 0.1 10*3/uL (ref 0.0–0.1)
Basophils Relative: 1 %
Eosinophils Absolute: 0.3 10*3/uL (ref 0.0–0.5)
Eosinophils Relative: 3 %
HCT: 31.3 % — ABNORMAL LOW (ref 36.0–46.0)
Hemoglobin: 9.3 g/dL — ABNORMAL LOW (ref 12.0–15.0)
Immature Granulocytes: 0 %
Lymphocytes Relative: 12 %
Lymphs Abs: 1 10*3/uL (ref 0.7–4.0)
MCH: 21.4 pg — ABNORMAL LOW (ref 26.0–34.0)
MCHC: 29.7 g/dL — ABNORMAL LOW (ref 30.0–36.0)
MCV: 72.1 fL — ABNORMAL LOW (ref 80.0–100.0)
Monocytes Absolute: 0.8 10*3/uL (ref 0.1–1.0)
Monocytes Relative: 9 %
Neutro Abs: 6.8 10*3/uL (ref 1.7–7.7)
Neutrophils Relative %: 75 %
Platelets: 300 10*3/uL (ref 150–400)
RBC: 4.34 MIL/uL (ref 3.87–5.11)
RDW: 17.9 % — ABNORMAL HIGH (ref 11.5–15.5)
WBC: 9 10*3/uL (ref 4.0–10.5)
nRBC: 0 % (ref 0.0–0.2)

## 2021-12-06 LAB — GLUCOSE, CAPILLARY
Glucose-Capillary: 167 mg/dL — ABNORMAL HIGH (ref 70–99)
Glucose-Capillary: 238 mg/dL — ABNORMAL HIGH (ref 70–99)

## 2021-12-06 MED ORDER — SODIUM CHLORIDE 0.9 % IV SOLN
250.0000 mg | Freq: Once | INTRAVENOUS | Status: AC
  Administered 2021-12-06: 250 mg via INTRAVENOUS
  Filled 2021-12-06: qty 20

## 2021-12-06 MED ORDER — FLUTICASONE-UMECLIDIN-VILANT 100-62.5-25 MCG/ACT IN AEPB: 1.0000 | INHALATION_SPRAY | Freq: Every day | RESPIRATORY_TRACT | Status: DC

## 2021-12-06 MED ORDER — UMECLIDINIUM BROMIDE 62.5 MCG/ACT IN AEPB
1.0000 | INHALATION_SPRAY | Freq: Every day | RESPIRATORY_TRACT | Status: DC
  Administered 2021-12-06 – 2021-12-07 (×2): 1 via RESPIRATORY_TRACT
  Filled 2021-12-06: qty 7

## 2021-12-06 MED ORDER — ENOXAPARIN SODIUM 60 MG/0.6ML IJ SOSY
50.0000 mg | PREFILLED_SYRINGE | Freq: Every day | INTRAMUSCULAR | Status: DC
  Administered 2021-12-06: 50 mg via SUBCUTANEOUS
  Filled 2021-12-06: qty 0.6

## 2021-12-06 MED ORDER — GABAPENTIN 100 MG PO CAPS
200.0000 mg | ORAL_CAPSULE | Freq: Three times a day (TID) | ORAL | Status: DC
  Administered 2021-12-06 – 2021-12-07 (×4): 200 mg via ORAL
  Filled 2021-12-06 (×4): qty 2

## 2021-12-06 MED ORDER — AMLODIPINE BESYLATE 2.5 MG PO TABS
2.5000 mg | ORAL_TABLET | Freq: Every day | ORAL | Status: DC
  Administered 2021-12-06 – 2021-12-07 (×2): 2.5 mg via ORAL
  Filled 2021-12-06 (×2): qty 1

## 2021-12-06 MED ORDER — FLUTICASONE FUROATE-VILANTEROL 100-25 MCG/ACT IN AEPB
1.0000 | INHALATION_SPRAY | Freq: Every day | RESPIRATORY_TRACT | Status: DC
  Administered 2021-12-06: 1 via RESPIRATORY_TRACT
  Filled 2021-12-06: qty 28

## 2021-12-06 MED ORDER — METOPROLOL SUCCINATE ER 50 MG PO TB24
100.0000 mg | ORAL_TABLET | Freq: Every day | ORAL | Status: DC
  Administered 2021-12-06 – 2021-12-07 (×2): 100 mg via ORAL
  Filled 2021-12-06 (×2): qty 2

## 2021-12-06 MED ORDER — ACETAMINOPHEN 650 MG RE SUPP: 650.0000 mg | Freq: Four times a day (QID) | RECTAL | Status: DC | PRN

## 2021-12-06 MED ORDER — ALBUTEROL SULFATE (2.5 MG/3ML) 0.083% IN NEBU
2.5000 mg | INHALATION_SOLUTION | Freq: Four times a day (QID) | RESPIRATORY_TRACT | Status: DC | PRN
  Administered 2021-12-06 – 2021-12-07 (×3): 2.5 mg via RESPIRATORY_TRACT
  Filled 2021-12-06 (×3): qty 3

## 2021-12-06 MED ORDER — LOSARTAN POTASSIUM 50 MG PO TABS
100.0000 mg | ORAL_TABLET | Freq: Every day | ORAL | Status: DC
  Administered 2021-12-06 – 2021-12-07 (×2): 100 mg via ORAL
  Filled 2021-12-06 (×2): qty 2

## 2021-12-06 MED ORDER — EMPAGLIFLOZIN 10 MG PO TABS
10.0000 mg | ORAL_TABLET | Freq: Every day | ORAL | Status: DC
  Administered 2021-12-07: 10 mg via ORAL
  Filled 2021-12-06 (×3): qty 1

## 2021-12-06 MED ORDER — INSULIN GLARGINE-YFGN 100 UNIT/ML ~~LOC~~ SOLN
50.0000 [IU] | Freq: Every day | SUBCUTANEOUS | Status: DC
  Administered 2021-12-06: 50 [IU] via SUBCUTANEOUS
  Filled 2021-12-06 (×2): qty 0.5

## 2021-12-06 MED ORDER — ACETAMINOPHEN 325 MG PO TABS
650.0000 mg | ORAL_TABLET | Freq: Four times a day (QID) | ORAL | Status: DC | PRN
  Administered 2021-12-06: 650 mg via ORAL
  Filled 2021-12-06: qty 2

## 2021-12-06 MED ORDER — INSULIN DEGLUDEC 100 UNIT/ML ~~LOC~~ SOPN: 50.0000 [IU] | PEN_INJECTOR | Freq: Every day | SUBCUTANEOUS | Status: DC

## 2021-12-06 NOTE — Telephone Encounter (Signed)
TC from asking about her admission and could she eat.  Call to Admissions.  Still awaiting discharges.  Patient is on the list and may be called today after discharges discharges.  Patient was called and informed and told to go ahead and eat as admitting  time is still not known.

## 2021-12-06 NOTE — H&P (Addendum)
NAME:  Caitlyn Lawrence, MRN:  [id number], DOB:  1946/08/14, LOS: 0 ADMISSION DATE:  12/06/2021, Primary: Riesa Pope, MD  CHIEF COMPLAINT:  GIB   Medical Service: Internal Medicine Teaching Service         Attending Physician: Dr. Lucious Groves, DO    First Contact: Dr. Vinetta Bergamo Pager: [phone]  Second Contact: Dr. Allyson Sabal Pager: [phone]       After Hours (After 5p/  First Contact Pager: [phone]  weekends / holidays): Second Contact Pager: [phone]    History of present illness   75 year old female with the below past medical history pertinent for recurrent GI bleeding secondary to AVMs status post APC treatment in April presented to the hospital today for a direct admission from our clinic due to recurrent bleeding.  She is initially admitted April 2022 for GI bleed.  EGD and colonoscopy that admission revealed several AVMs in the duodenum and colon.  These were subsequently treated with APC.   She underwent capsule endoscopy in November which showed more AVMs. she was referred to hematology by oncology for iron transfusions due to ongoing blood loss from these.  GI notes that the plan for her to undergo a balloon enteroscopy in February.  She presented to the [facility] yesterday for melena and hematochezia although she had noted that it had been going on since her admission in April.  She had noted vertigo during that visit and her PCP was concerned regarding symptomatic anemia.  On admission today, she is able to clearly identify that the symptoms she experiences is true vertigo, with the room spinning around her.  She states this typically occurs upon standing up.  Sometimes she develops some mild symptoms while turning over in bed.  She notes that this is happened to her in the past several years ago.  She does not believe that she went to vestibular rehab at that time and the symptoms eventually resolved on their own.  She denies acute abdominal pain or pain with bowel  movements.  She tells me that she gets scared when she sees blood in the toilet.  She is unable to quantify how much blood has been in the toilet but she does note that its bright red.  She denies seeing clots in it.  She denies changes in her bowel or bladder function.  Denies recent infection.  Past Medical History  She,  has a past medical history of Angiectasia, CAD (coronary artery disease), Candidal esophagitis ([place]), COPD with acute exacerbation ([place]), Diabetes ([place]), Diverticulosis, GI bleed, H. pylori infection (10/2017), Heart failure ([place]), Hypertension, IDA (iron deficiency anemia), Internal hemorrhoids, MI (myocardial infarction) ([place]), Osteoarthritis, Pancreatic steatorrhea, Paroxysmal atrial fibrillation ([place]), and Tubular adenoma of colon.   Home Medications     Prior to Admission medications   Medication Sig Start Date End Date Taking? Authorizing Provider  albuterol (ACCUNEB) 0.63 MG/3ML nebulizer solution Take 3 mLs (0.63 mg total) by nebulization every 6 (six) hours as needed for wheezing or shortness of breath. 05/23/21  Yes Katsadouros, Vasilios, MD  amLODipine (NORVASC) 2.5 MG tablet Take 2.5 mg by mouth daily. 03/26/21  Yes [provider]  Clobetasol Propionate 0.05 % shampoo Please apply to scalp daily Patient taking differently: Apply 1 application topically every other day. 11/18/21  Yes Katsadouros, Vasilios, MD  COMBIVENT RESPIMAT 20-100 MCG/ACT AERS respimat Inhale 1 puff into the lungs 4 (four) times daily. 09/09/21  Yes Margaretha Seeds, MD  ferrous sulfate 325 (65 FE) MG tablet Take  1 tablet (325 mg total) by mouth every Monday, Wednesday, and Friday. 08/23/21 02/08/22 Yes Gaylan Gerold, DO  Fluticasone-Umeclidin-Vilant (TRELEGY ELLIPTA) 100-62.5-25 MCG/INH AEPB Inhale 1 puff into the lungs daily. 09/09/21  Yes Margaretha Seeds, MD  gabapentin (NEURONTIN) 100 MG capsule Take 200 mg by mouth 3 (three) times daily.   Yes [provider]  insulin  degludec (TRESIBA FLEXTOUCH) 100 UNIT/ML FlexTouch Pen Inject 50 Units into the skin at bedtime. 11/18/21 02/16/22 Yes Katsadouros, Vasilios, MD  JARDIANCE 10 MG TABS tablet TAKE 1 TABLET BY MOUTH ONCE DAILY BEFORE BREAKFAST Patient taking differently: Take 10 mg by mouth daily. 10/01/21  Yes Katsadouros, Vasilios, MD  losartan (COZAAR) 100 MG tablet Take 1 tablet (100 mg total) by mouth daily. 11/18/21  Yes Katsadouros, Vasilios, MD  lovastatin (MEVACOR) 20 MG tablet Take 20 mg by mouth every evening.   Yes [provider]  metoprolol succinate (TOPROL-XL) 100 MG 24 hr tablet Take 1 tablet (100 mg total) by mouth daily. Take with or immediately following a meal. 05/23/21  Yes Katsadouros, Vasilios, MD  polyethylene glycol (MIRALAX / GLYCOLAX) 17 g packet Take 17 g by mouth as needed for mild constipation. Patient taking differently: Take 17 g by mouth daily as needed for mild constipation. 05/07/21  Yes Pyrtle, Lajuan Lines, MD  rivaroxaban (XARELTO) 20 MG TABS tablet Take 1 tablet (20 mg total) by mouth every morning. Patient taking differently: Take 20 mg by mouth daily. 10/29/21  Yes Katsadouros, Vasilios, MD  sodium chloride (OCEAN) 0.65 % SOLN nasal spray Place 1 spray into both nostrils 3 (three) times daily as needed for congestion.   Yes [provider]  Vitamin D, Ergocalciferol, (DRISDOL) 1.25 MG (50000 UNIT) CAPS capsule Take 50,000 Units by mouth every 7 (seven) days. Mondays   Yes [provider]  Blood Glucose Monitoring Suppl (ONETOUCH VERIO) w/Device KIT 1 Units by Does not apply route 3 (three) times daily. 04/19/21   Mosetta Anis, MD  glucose blood [facility] VERIO) test strip 1 each by Other route 3 (three) times daily. Use as instructed 05/23/21   Riesa Pope, MD  Insulin Pen Needle 32G X 4 MM MISC Use to inject insulin 4 times a day 08/22/21   Gaylan Gerold, DO  Insulin Syringe-Needle U-100 31G X 15/64" 0.5 ML MISC Please use with insulin 05/29/21    Riesa Pope, MD  Lancets MISC 1 Units by Does not apply route in the morning, at noon, and at bedtime. 04/19/21   Mosetta Anis, MD  OneTouch Delica Lancets 67Y MISC Please check your sugars 4x daily. 05/23/21   Riesa Pope, MD    Allergies    Allergies as of 12/05/2021 - Review Complete 12/05/2021  Allergen Reaction Noted   Sulfa antibiotics Other (See Comments), Hives, and Itching 06/05/2011   Penicillins Hives and Itching 04/08/2021    Social History   reports that she has been smoking cigarettes. She has a 60.00 pack-year smoking history. She has quit using smokeless tobacco. She reports that she does not currently use alcohol. She reports that she does not use drugs.   Family History   Her family history includes Alzheimer's disease in her brother; Aneurysm in her mother; Diabetes in her sister; Hypertension in her sister; Lung cancer in her father and paternal aunt; Ovarian cancer in her daughter; Uterine cancer in her maternal aunt and mother.   ROS  10 point review of systems negative unless stated in the HPI.  Objective  Blood pressure (!) 123/57, pulse 90, temperature 98.9 F (37.2 C), resp. rate 18, SpO2 99 %.    General: Obese female in no acute distress HEENT: Moist mucous membranes, head atraumatic.  No scleral icterus or conjunctival injection Cardiac: Regular rate and rhythm Pulm: Breathing comfortably on room air, lung sounds are clear throughout GI: Abdomen soft, nondistended, no tenderness to palpation.  Bowel sounds active.  Rectal exam deferred, performed in the clinic on day prior to admission. Neuro: Alert and oriented x4.  No focal neurologic deficits on limited exam Skin: No rash or lesion on limited exam MSK: Normal muscle bulk and tone  Labs    CBC Latest Ref Rng & Units 12/06/2021 12/05/2021 11/08/2021  WBC 4.0 - 10.5 K/uL 9.0 9.3 6.6  Hemoglobin 12.0 - 15.0 g/dL 9.3(L) 9.3(L) 10.0(L)  Hematocrit 36.0 - 46.0 % 31.3(L) 31.6(L)  33.3(L)  Platelets 150 - 400 K/uL 300 357 315.0   BMP Latest Ref Rng & Units 12/06/2021 08/22/2021 06/10/2021  Glucose 70 - 99 mg/dL 158(H) 108(H) 94  BUN 8 - 23 mg/dL _0 Creatinine 0.44 - 1.00 mg/dL 0.72 0.73 0.66  BUN/Creat Ratio 12 - 28 - 12 -  Sodium 135 - 145 mmol/L 133(L) 138 139  Potassium 3.5 - 5.1 mmol/L 4.0 4.3 4.3  Chloride 98 - 111 mmol/L 100 99 101  CO2 22 - 32 mmol/L _1 Calcium 8.9 - 10.3 mg/dL 9.0 9.5 9.3    Summary  75 year old female with history of GI bleeding due to AVMs treated with APC who was admitted under observation for lab monitoring and IV iron transfusion in the setting of recurrent bleed.  Assessment & Plan:  Principal Problem:   Acute GI bleeding Active Problems:   Insulin dependent type 2 diabetes mellitus (HCC)   Iron deficiency anemia   Paroxysmal A-fib (HCC)   AVM (arteriovenous malformation) of small bowel, acquired  Acute on chronic iron deficiency anemia due to current GI bleeding secondary to AVMs Positional vertigo Thought to be symptomatic on evaluation in the clinic yesterday. Orthostatic vitals unremarkable.  I would not expect her to be symptomatic from the anemia with hemoglobin of 9.3 and this obviously being a slow bleed given that her hemoglobin has dropped less than a gram over the last month from 10.0-9.3.  She is hemodynamically stable and hemoglobin is actually unchanged from yesterday at 9.3.   History of BPPV noted on her problem list although her symptoms are not typical of this on admission. She may benefit from evaluation by vestibular PT. As long as she is able to ambulate safely with physical therapy, I do not think this is the reason to keep her in the hospital and to be followed up outpatient. - Admit to med-surg for overnight observation for hemoglobin monitoring - Iron transfusion - Hold Xarelto - Repeat CBC in a.m.  If stable, will likely be able to discharge home and continue to have her hemoglobin followed in  the clinic.  She may benefit from outpatient iron transfusions in the future to avoid recurrent admissions. - PT consult for safe discharge planning - Outpatient follow-up with GI  Hypertension. Chronic and stable Paroxysmal atrial fibrillation (CHA2DS2-VASc 6) - Continue home medications including amlodipine, losartan, metoprolol -She would benefit from a detailed Risk versus benefit discussion with her PCP about ongoing Xarelto use given her recurrent GI bleeding  COPD not in acute exacerbation - Continue home inhalers  Type 2 diabetes mellitus -Continue Tresiba 50 units  nightly - CBG monitoring  Best practice:  CODE STATUS: Full DVT for prophylaxis: hold for active GIB Social considerations/Family communication: updated by pt Dispo: Admit patient to Observation with expected length of stay less than 2 midnights.   Mitzi Hansen, MD Internal Medicine Resident PGY-3 [facility] Pager: [phone] 12/06/2021 5:04 PM

## 2021-12-06 NOTE — Plan of Care (Signed)

## 2021-12-06 NOTE — Progress Notes (Signed)
Patient is a direct admit to room 5N20. Alert and oriented and able to verbalize her needs to staff. No CO pain or discomfort at this time. Sitting in the chair without any distress. VS checked. Pt has PIV to her left FA, good blood return and flushes well. Waiting for Dr order for further care.

## 2021-12-07 DIAGNOSIS — K922 Gastrointestinal hemorrhage, unspecified: Secondary | ICD-10-CM

## 2021-12-07 LAB — CBC
HCT: 28.7 % — ABNORMAL LOW (ref 36.0–46.0)
Hemoglobin: 8.5 g/dL — ABNORMAL LOW (ref 12.0–15.0)
MCH: 21.2 pg — ABNORMAL LOW (ref 26.0–34.0)
MCHC: 29.6 g/dL — ABNORMAL LOW (ref 30.0–36.0)
MCV: 71.6 fL — ABNORMAL LOW (ref 80.0–100.0)
Platelets: 283 10*3/uL (ref 150–400)
RBC: 4.01 MIL/uL (ref 3.87–5.11)
RDW: 17.8 % — ABNORMAL HIGH (ref 11.5–15.5)
WBC: 7.7 10*3/uL (ref 4.0–10.5)
nRBC: 0 % (ref 0.0–0.2)

## 2021-12-07 LAB — BASIC METABOLIC PANEL
Anion gap: 10 (ref 5–15)
BUN: 9 mg/dL (ref 8–23)
CO2: 24 mmol/L (ref 22–32)
Calcium: 8.6 mg/dL — ABNORMAL LOW (ref 8.9–10.3)
Chloride: 102 mmol/L (ref 98–111)
Creatinine, Ser: 0.6 mg/dL (ref 0.44–1.00)
GFR, Estimated: >60 mL/min (ref >60)
Glucose, Bld: 180 mg/dL — ABNORMAL HIGH (ref 70–99)
Potassium: 3.8 mmol/L (ref 3.5–5.1)
Sodium: 136 mmol/L (ref 135–145)

## 2021-12-07 LAB — GLUCOSE, CAPILLARY
Glucose-Capillary: 128 mg/dL — ABNORMAL HIGH (ref 70–99)
Glucose-Capillary: 142 mg/dL — ABNORMAL HIGH (ref 70–99)
Glucose-Capillary: 144 mg/dL — ABNORMAL HIGH (ref 70–99)

## 2021-12-07 LAB — RETICULOCYTES
Immature Retic Fract: 32 % — ABNORMAL HIGH (ref 2.3–15.9)
RBC.: 3.99 MIL/uL (ref 3.87–5.11)
Retic Count, Absolute: 86.6 10*3/uL (ref 19.0–186.0)
Retic Ct Pct: 2.2 % (ref 0.4–3.1)

## 2021-12-07 MED ORDER — SODIUM CHLORIDE 0.9 % IV SOLN
250.0000 mg | Freq: Once | INTRAVENOUS | Status: AC
  Administered 2021-12-07: 250 mg via INTRAVENOUS
  Filled 2021-12-07: qty 20

## 2021-12-07 MED ORDER — POLYETHYLENE GLYCOL 3350 17 G PO PACK
17.0000 g | PACK | Freq: Every day | ORAL | Status: DC
  Administered 2021-12-07: 17 g via ORAL
  Filled 2021-12-07: qty 1

## 2021-12-07 NOTE — TOC Transition Note (Signed)
Transition of Care [facility]) - CM/SW Discharge Note   Patient Details  Name: Caitlyn Lawrence MRN: [id number] Date of Birth: [birth year]  Transition [facility]) CM/SW Contact:  Bartholomew Crews, RN Phone Number: [phone] 12/07/2021, 5:02 PM   Clinical Narrative:     Patient to transition home today. [place] referral accepted for PT and nursing. HH order updated by MD. Latricia Heft aware of transition home today and will follow up tomorrow afternoon with patient. No further TOC needs identified.   Final next level of care: [place] Barriers to Discharge: No Barriers Identified   Patient Goals and CMS Choice Patient states their goals for this hospitalization and ongoing recovery are:: return home with son/dil CMS Medicare.gov Compare Post Acute Care list provided to:: Patient Choice offered to / list presented to : Patient  Discharge Placement                       Discharge Plan and Services In-house Referral: NA Discharge Planning Services: CM Consult Post Acute Care Choice: Home Health          DME Arranged: N/A DME Agency: NA       HH Arranged: RN, PT HH Agency: [place] Date [facility] Agency Contacted: 12/07/21 Time [place]: [id number] Representative spoke with at [place]: Amy  Social Determinants of Health ([place]) Interventions     Readmission Risk Interventions No flowsheet data found.

## 2021-12-07 NOTE — TOC Initial Note (Signed)
Transition of Care [facility]) - Initial/Assessment Note    Patient Details  Name: Caitlyn Lawrence MRN: [id number] Date of Birth: 15-Dec-1946  Transition [facility]) CM/SW Contact:    Bartholomew Crews, RN Phone Number: [phone] 12/07/2021, 1:10 PM  Clinical Narrative:                  Spoke with patient at the bedside to discuss plans for transition home. Demographics, PCP, and preferred pharmacy verified. Lives with her son and dil stating they won't let her live alone d/t she falls too much. Has a cane and walker at home. Agreeable to [facility] PT - offered agency choice. Referral accepted by [facility] - patient will need HH PT and Face to Face order at discharge. Family provides transportation for medical appointments and will provide transportation home from hospital when ready to discharge home. TOC following for transition needs.   Expected Discharge Plan: [place] Barriers to Discharge: Continued Medical Work up   Patient Goals and CMS Choice Patient states their goals for this hospitalization and ongoing recovery are:: return home with son/dil CMS Medicare.gov Compare Post Acute Care list provided to:: Patient Choice offered to / list presented to : Patient  Expected Discharge Plan and Services Expected Discharge Plan: [place] In-house Referral: NA Discharge Planning Services: CM Consult Post Acute Care Choice: [place] arrangements for the past 2 months: Single Family Home                 DME Arranged: N/A DME Agency: NA       HH Arranged: PT HH Agency: [place] Date [place]: 12/07/21 Time HH Agency Contacted: 50 Representative spoke with at [place]: [place] Arrangements/Services Living arrangements for the past 2 months: [place] with:: Self, Adult Children Patient language and need for interpreter reviewed:: Yes Do you feel safe going back to the place where you live?: Yes       Need for Family Participation in Patient Care: Yes (Comment) Care giver support system in place?: Yes (comment) Current home services: DME (cane, walker) Criminal Activity/Legal Involvement Pertinent to Current Situation/Hospitalization: No - Comment as needed  Activities of Daily Living Home Assistive Devices/Equipment: Cane (specify quad or straight) ADL Screening (condition at time of admission) Patient's cognitive ability adequate to safely complete daily activities?: Yes Is the patient deaf or have difficulty hearing?: No Does the patient have difficulty seeing, even when wearing glasses/contacts?: No Does the patient have difficulty concentrating, remembering, or making decisions?: No Patient able to express need for assistance with ADLs?: Yes Does the patient have difficulty dressing or bathing?: No Independently performs ADLs?: Yes (appropriate for developmental age) Does the patient have difficulty walking or climbing stairs?: No Weakness of Legs: Both Weakness of Arms/Hands: Both  Permission Sought/Granted                  Emotional Assessment Appearance:: Appears stated age Attitude/Demeanor/Rapport: Engaged Affect (typically observed): Accepting Orientation: : Oriented to Self, Oriented to Place, Oriented to  Time, Oriented to Situation Alcohol / Substance Use: Not Applicable Psych Involvement: No (comment)  Admission diagnosis:  Symptomatic anemia [D64.9] Patient Active Problem List   Diagnosis Date Noted   Symptomatic anemia 12/06/2021   AVM (arteriovenous malformation) of small bowel, acquired 12/06/2021   Acute GI bleeding 12/06/2021   Centrilobular emphysema ([place]) 09/09/2021   Mouth sore 09/09/2021   Superficial burn of  right index finger 08/22/2021   Degenerative disc disease, lumbar 08/20/2021   Fibromyalgia 08/20/2021   Hyperlipidemia 08/20/2021   Osteopenia 08/20/2021   RBBB (right bundle branch block with left anterior fascicular block)  08/20/2021   Tobacco abuse 08/20/2021   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   Healthcare maintenance 05/23/2021   Paroxysmal A-fib ([place]) 04/25/2021   Angiodysplasia of duodenum with hemorrhage    Angiodysplasia of the colon    Heart failure (HCC)    Candidal esophagitis (HCC)    Iron deficiency anemia    COPD with acute exacerbation ([place]) 04/08/2021   Insulin dependent type 2 diabetes mellitus ([place]) 04/08/2021   CAD (s/p MI and CABG x3) 04/08/2021   HTN (hypertension) 04/08/2021   History of GI bleed    Syncope and collapse 09/06/2020   Benign paroxysmal positional vertigo of right ear 11/13/2019   TIA (transient ischemic attack) 11/13/2019   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   OAB (overactive bladder) 05/19/2017   Spondylolisthesis of lumbar region 06/15/2015   Enthesopathy of hip region 12/27/2014   Generalized OA 06/11/2011   OSA (obstructive sleep apnea) 06/11/2011   PCP:  Riesa Pope, MD Pharmacy:   [facility] [place] [address] Phone: [phone] Fax: [phone]     Social Determinants of Health (SDOH) Interventions    Readmission Risk Interventions No flowsheet data found.

## 2021-12-07 NOTE — Care Management Obs Status (Signed)
[place] NOTIFICATION   Patient Details  Name: Eyanna Mcgonagle MRN: [id number] Date of Birth: [birth year]   Medicare Observation Status Notification Given:  Yes    Bartholomew Crews, RN 12/07/2021, 1:04 PM

## 2021-12-07 NOTE — Discharge Summary (Addendum)
Name: Caitlyn Lawrence MRN: [id number] DOB: [birth year] 75 y.o. PCP: Riesa Pope, MD  Date of Admission: 12/06/2021 10:30 AM Date of Discharge: 12/07/2021 Attending Physician: Axel Filler, *  Discharge Diagnosis: 1. 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 Benign paroxysmal positional vertigo 3. Atrial Fibrillation  4. Chronic obstructive pulmonary disease 5. Constipation 6. Type II Diabetes Mellitus  Discharge Medications: Allergies as of 12/07/2021       Reactions   Sulfa Antibiotics Other (See Comments), Hives, Itching   Pt does not like how it makes her feel.  Other reaction(s): Other (See Comments) Pt does not like how it makes her feel.    Penicillins Hives, Itching        Medication List     TAKE these medications    albuterol 0.63 MG/3ML nebulizer solution Commonly known as: ACCUNEB Take 3 mLs (0.63 mg total) by nebulization every 6 (six) hours as needed for wheezing or shortness of breath.   amLODipine 2.5 MG tablet Commonly known as: NORVASC Take 2.5 mg by mouth daily.   Clobetasol Propionate 0.05 % shampoo Please apply to scalp daily What changed:  how much to take how to take this when to take this additional instructions   Combivent Respimat 20-100 MCG/ACT Aers respimat Generic drug: Ipratropium-Albuterol Inhale 1 puff into the lungs 4 (four) times daily.   ferrous sulfate 325 (65 FE) MG tablet Take 1 tablet (325 mg total) by mouth every Monday, Wednesday, and Friday.   gabapentin 100 MG capsule Commonly known as: NEURONTIN Take 200 mg by mouth 3 (three) times daily.   Insulin Pen Needle 32G X 4 MM Misc Use to inject insulin 4 times a day   Insulin Syringe-Needle U-100 31G X 15/64" 0.5 ML Misc Please use with insulin   Jardiance 10 MG Tabs tablet Generic drug: empagliflozin TAKE 1 TABLET BY MOUTH ONCE DAILY BEFORE BREAKFAST What  changed:  how much to take when to take this   Lancets Misc 1 Units by Does not apply route in the morning, at noon, and at bedtime.   OneTouch Delica Lancets 99I Misc Please check your sugars 4x daily.   losartan 100 MG tablet Commonly known as: COZAAR Take 1 tablet (100 mg total) by mouth daily.   lovastatin 20 MG tablet Commonly known as: MEVACOR Take 20 mg by mouth every evening.   metoprolol succinate 100 MG 24 hr tablet Commonly known as: TOPROL-XL Take 1 tablet (100 mg total) by mouth daily. Take with or immediately following a meal.   OneTouch Verio test strip Generic drug: glucose blood 1 each by Other route 3 (three) times daily. Use as instructed   OneTouch Verio w/Device Kit 1 Units by Does not apply route 3 (three) times daily.   polyethylene glycol 17 g packet Commonly known as: MIRALAX / GLYCOLAX Take 17 g by mouth as needed for mild constipation. What changed: when to take this   rivaroxaban 20 MG Tabs tablet Commonly known as: Xarelto Take 1 tablet (20 mg total) by mouth every morning. What changed: when to take this   sodium chloride 0.65 % Soln nasal spray Commonly known as: OCEAN Place 1 spray into both nostrils 3 (three) times daily as needed for congestion.   Trelegy Ellipta 100-62.5-25 MCG/ACT Aepb Generic drug: Fluticasone-Umeclidin-Vilant Inhale 1 puff into the lungs daily.   Tyler Aas FlexTouch 100 UNIT/ML FlexTouch Pen Generic drug: insulin degludec Inject 50 Units into the skin  at bedtime.   Vitamin D (Ergocalciferol) 1.25 MG (50000 UNIT) Caps capsule Commonly known as: DRISDOL Take 50,000 Units by mouth every 7 (seven) days. Mondays        Disposition and follow-up:   Caitlyn Lawrence was discharged from [facility] in Stable condition.  At the hospital follow up visit please address:  1.  Problems: Acute on chronic iron deficiency anemia secondary to gastrointestinal bleeding likely due to arteriovenous  malformation.  -f/u in [facility] in 1 week  -will need repeat CBC to check Hgb -iron deficit of 860m, received two infusions of ferrilicit here ([id number]), will likely need additional iron therapy -ensure she has GI f/u and, if possible, have GI evaluate for balloon endoscopy at an earlier date (currently scheduled for 01/2022 [facility] may need vestibular PT (can evaluate for this as outpatient) -of note, some dark stools may be from oral iron supplementation  A Fib: held xarelto on admission given concern for bleeding. Hgb is fairly stable, so will resume at discharge. CHADSVASc of 6 and HASBLED of 4, likely more beneficial to continue xarelto than holding it (patient would like to continue it as well).   2.  Labs / imaging needed at time of follow-up: CBC, iron panel in 1 month  3.  Pending labs/ test needing follow-up:  None at this time.  Follow-up Appointments: F/u with PCP in 1 week  Follow-up Information     [facility].   Specialty: HGoodrichWhy: the office which now goes by name ELatricia Heftwill call to schedule home health visits Contact information: [phone]                Hospital Course by problem list: Ms. 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      1. 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 Patient presented to the hospital complaining of hematochezia and melena for 8 months. She noted that most recently she has been experiencing bright red blood in her stool along with fatigue and weakness. She had an EGD and colonoscopy 8 months ago which showed a non-bleeding ulcer and AVMs.  She was treated with argon plasma coagulation at the time. She had a capsule endoscopy 2 months ago which revealed presence of more AVMs and pt was referred for iron infusions. Pt missed her latest infusion treatment on 12/02/21 and continued to have multiple episodes of bloody stools. During this admission, pt's hemoglobin has been stable at latest value of 8.5 with a ferritin of 6, MCV of 71.6, RDW of 17.8 and reticulocyte count of 2.2 which indicates that she has a microcytic anemia with iron store depletion. Calculated total iron deficit is 831 mg. Pt received an iron infusion of 250 mg yesterday and will receive an additional 250 mg iron infusion today to replete stores. Received total 505mhere, but calculated iron deficit of about 83033mPer her outpatient gastroenterologist, pt has an appointment in February to obtain a balloon enteroscopy to further evaluate and treat her AVM which will likely improve her recurrent bleeds and iron deficiency status. Pt is stable for discharge at this  time. Will f/u at [facility] for hospital f/u in 1 week to recheck Hgb. Will also need repeat iron studies in 1 month to ensure adequate supplementation.  2.Possible benign paroxysmal positional vertigo. Pt has had two episodes in the past where she experienced dizziness with positional changes. The first episode was 2 years ago where she had mild dizziness while turning in bed and severe symptoms while getting out of bed. She reports that she had a workup done and diagnosed with vertigo at the time but denies she takes any medications for it. Several months ago she had another episode of dizziness which caused her to trip over her cat's food but she was able to catch herself and sit down. She also reports that she has an extensive history of ear infections with purulent drainage over her lifetime and has seen an ENT specialist in the past. She denies that she had any episodes of dizziness or lightheadedness recently or during this  admission. Orthostatic vitals negative yesterday. Going home with [facility] PT, can evaluate for benefit of vestibular PT as an outpatient.  3. Atrial fibrillation. Pt has a history of a-fib and has been on Xarelto 20 mg QD in the past. Discussed risk of bleeding vs risk of stroke with patient. HAS-BLED score is 3 and CHAD-VASC score is 6 at this time indicating that there is a 5.8% risk of GIB and 13.6% risk of stroke/TIA/embolism with her risk factors at this time. It was also noted that patient is able to compensate for any GIB with adequate repletion of iron. Pt is amenable to taking Xarelto for increased risk of stroke and monitoring for any changes in her overall symptomatology and GIB.   4. Chronic obstructive pulmonary disease. Pt has a long standing history of smoking cigarettes (60 pack year history). She has some SOB at baseline due to this and was receiving her home medications during this admission.   5 . Constipation. Pt has a history of chronic constipation for which she takes miralax PRN. She noted that her last BM was 3 days ago and we continued her on Miralax.    Discharge HPI: Pt reports that she is feeling much better this morning. She notes that she has more energy and although she maybe not back to her usual self, she feel that she is getting close. She complains of some back pain which is not unusual for her, chronic. She does mention that she has a history of vertigo, but none during hospitalization. She also states that she has not had a BM for 3 days noting that she has constipation intermittently at home and takes miralax PRN. She denies any lightheadedness, dizziness, worsening or new SOB, or abdominal pain.   Discharge Exam:   BP (!) 108/54 (BP Location: Right Arm)   Pulse 65   Temp 98.3 F (36.8 C) (Oral)   Resp 16   SpO2 99%  Discharge exam: Physical Exam Vitals and nursing note reviewed.  Constitutional:      Appearance: Normal appearance. She is obese.  HENT:      Head: Normocephalic and atraumatic.     Nose: Nose normal.     Mouth/Throat:     Mouth: Mucous membranes are moist.  Eyes:     Conjunctiva/sclera: Conjunctivae normal.     Pupils: Pupils are equal, round, and reactive to light.  Cardiovascular:     Rate and Rhythm: Normal rate and regular rhythm.     Pulses: Normal pulses.  Heart sounds: Normal heart sounds. No murmur heard.   No friction rub. No gallop.  Pulmonary:     Effort: Pulmonary effort is normal.     Breath sounds: Wheezing (mild diffusely) present.  Abdominal:     General: Bowel sounds are normal.     Palpations: Abdomen is soft.     Comments: No tenderness to palpation  Musculoskeletal:        General: Swelling (mild bilaterally) present. Normal range of motion.     Cervical back: Normal range of motion.  Skin:    General: Skin is warm and dry.     Capillary Refill: Capillary refill takes less than 2 seconds.  Neurological:     General: No focal deficit present.     Mental Status: She is alert and oriented to person, place, and time. Mental status is at baseline.  Psychiatric:        Mood and Affect: Mood normal.        Behavior: Behavior normal.     Pertinent Labs, Studies, and Procedures:   CBC    Component Value Date/Time   WBC 7.7 12/07/2021 0151   RBC 4.01 12/07/2021 0151   RBC 3.99 12/07/2021 0151   HGB 8.5 (L) 12/07/2021 0151   HGB 13.2 07/15/2021 1033   HCT 28.7 (L) 12/07/2021 0151   HCT 42.6 07/15/2021 1033   PLT 283 12/07/2021 0151   PLT 337 07/15/2021 1033   MCV 71.6 (L) 12/07/2021 0151   MCV 89 07/15/2021 1033   MCH 21.2 (L) 12/07/2021 0151   MCHC 29.6 (L) 12/07/2021 0151   RDW 17.8 (H) 12/07/2021 0151   RDW 13.6 07/15/2021 1033   LYMPHSABS 1.0 12/06/2021 1217   MONOABS 0.8 12/06/2021 1217   EOSABS 0.3 12/06/2021 1217   BASOSABS 0.1 12/06/2021 1217     CMP     Component Value Date/Time   NA 136 12/07/2021 0151   NA 138 08/22/2021 1122   K 3.8 12/07/2021 0151   CL 102  12/07/2021 0151   CO2 24 12/07/2021 0151   GLUCOSE 180 (H) 12/07/2021 0151   BUN 9 12/07/2021 0151   BUN 9 08/22/2021 1122   CREATININE 0.60 12/07/2021 0151   CALCIUM 8.6 (L) 12/07/2021 0151   PROT 6.8 12/06/2021 1217   ALBUMIN 3.6 12/06/2021 1217   AST 18 12/06/2021 1217   ALT 14 12/06/2021 1217   ALKPHOS 83 12/06/2021 1217   BILITOT 0.1 (L) 12/06/2021 1217   GFRNONAA >60 12/07/2021 0151     CUP PACEART REMOTE DEVICE CHECK  Result Date: 12/02/2021 ILR summary report received. Battery status OK. Normal device function. No new symptom, tachy, brady, or pause episodes. No new AF episodes. Monthly summary reports and ROV/PRN LR    Discharge Instructions: Discharge Instructions     Call MD for:  persistant dizziness or light-headedness   Complete by: As directed    Diet - low sodium heart healthy   Complete by: As directed    Discharge instructions   Complete by: As directed    Caitlyn Lawrence, it was a pleasure taking care of you during your time here. You came in for dark stools and concern that your blood counts were going too low because of low iron. You received two iron infusions while here and will need to continue taking oral iron at home. For your own knowledge, oral iron can cause for your stools to become darker in color.  Please make sure to do the following: 1. I  have scheduled you to be seen at the [facility] in 1 week. Someone will call you to schedule this appointment. They will be able to make sure that your hemoglobin is not dropping too fast.  2. Physical therapy will come to your home to help with strength building exercises.  3. You received iron here, but will need more going forward. Please take the oral iron you have and reach out to your hematologist to schedule future iron infusion appointments for you.   Increase activity slowly   Complete by: As directed        Signed: Claudean Kinds, MS4 Pager: [phone]   Attestation for Student  Documentation:  I personally was present and performed or re-performed the history, physical exam and medical decision-making activities of this service and have verified that the service and findings are accurately documented in the student's note.  Virl Axe, MD 12/07/2021, 1:50 PM

## 2021-12-07 NOTE — Progress Notes (Signed)
Discharge summary packet provided to pt with instructions. Pt verbalized understanding of instructions. NO complaints. Pt d/c to home with [facility] as ordered.. Pt remains alert/oriented in no apparent distress. Per pt her son is responsible for her ride.

## 2021-12-07 NOTE — Evaluation (Signed)
Physical Therapy Evaluation Patient Details Name: Caitlyn Lawrence MRN: [id number] DOB: Jul 06, 1946 Today's Date: 12/07/2021  History of Present Illness  Pt. is 75 yr old F admitted on 12/06/21 for melanotic stools and orthostatic symptoms.  PMH: CAD, COPD, DM, CHF, HTN, anemia, MI, OA, a-fib, colon CA.  Clinical Impression  Pt was previously mod I with use of rollator for household distances only.  Pt currently requiring supervision for short distance amb with c/o fatigue during activity. Pt demos intermittent dizziness, generalized LE weakness, fair balance and dec activity tolerance and would benefit from skilled PT in acute care to address deficits indicated.  AMPAC score indicates home with [facility] and pt is agreeable to return home.  Dix-Hallpike testing negative B at this time, pt would benefit from further OP testing for c/o dizziness.     Recommendations for follow up therapy are one component of a multi-disciplinary discharge planning process, led by the attending physician.  Recommendations may be updated based on patient status, additional functional criteria and insurance authorization.  Follow Up Recommendations Home health PT    Assistance Recommended at Discharge Intermittent Supervision/Assistance  Functional Status Assessment Patient has had a recent decline in their functional status and demonstrates the ability to make significant improvements in function in a reasonable and predictable amount of time.  Equipment Recommendations       Recommendations for Other Services       Precautions / Restrictions Precautions Precautions: None      Mobility  Bed Mobility                 Patient Response: Cooperative  Transfers Overall transfer level: Modified independent Equipment used: Straight cane               General transfer comment: Performs sit > stand from chair and EOB without difficulty, uses SPC.    Ambulation/Gait Ambulation/Gait assistance:  Supervision Gait Distance (Feet): 20 Feet Assistive device: Straight cane Gait Pattern/deviations: Decreased step length - right;Decreased step length - left       General Gait Details: Declines use of RW in room, would rather use her SPC.  Amb with slow cadence and slight path deviation, intermittently reaching for furniture with L hand for additional support.  C/o fatigue with activity limiting gait distance.  Stairs            Wheelchair Mobility    Modified Rankin (Stroke Patients Only)       Balance Overall balance assessment: Mild deficits observed, not formally tested                                           Pertinent Vitals/Pain Pain Assessment: 0-10 Pain Score: 0-No pain    Home Living Family/patient expects to be discharged to:: Private residence   Available Help at Discharge: Family (States family works during the day and is available at night) Type of Home: House Home Access: Stairs to enter Entrance Stairs-Rails: Right Entrance Stairs-Number of Steps: 2   Home Layout: One level [place]: Rollator (4 wheels);Cane - single point Additional Comments: Uses rollator in house, states she often gets fatigued and needs to sit after short distances.  Uses SPC outside of home but states she really does not leave the house much.    Prior Function Prior Level of Function : Independent/Modified Independent  ADLs Comments: Mod I but takes her time with activities, uses a shower chair.     Hand Dominance        Extremity/Trunk Assessment   Upper Extremity Assessment Upper Extremity Assessment: RUE deficits/detail RUE Deficits / Details: Previous shoulder surgery limiting ROM    Lower Extremity Assessment Lower Extremity Assessment: Generalized weakness       Communication   Communication: No difficulties  Cognition Arousal/Alertness: Awake/alert Behavior During Therapy: WFL for tasks  assessed/performed Overall Cognitive Status: Within Functional Limits for tasks assessed                                 General Comments: Pt. is sitting up in chair when PT arrives.  Alert and oriented x 3.  Agreeable to work with PT.  States she is feeling better but still has intermittent dizziness.  Describes dizziness as feeling like she is going to pass out and that the room is spinning.        General Comments General comments (skin integrity, edema, etc.): Pt. is agreeable to Dix-Hallpike testing.  Testing is performed B, but (-) without any nystagmus or reports of dizziness on either side.  Will need additional testing to determine cause of dizziness with movement.    Exercises     Assessment/Plan    PT Assessment Patient needs continued PT services  PT Problem List Decreased strength;Decreased mobility;Decreased range of motion;Decreased activity tolerance;Decreased balance       PT Treatment Interventions DME instruction;Therapeutic exercise;Gait training;Balance training;Stair training;Functional mobility training;Therapeutic activities;Patient/family education    PT Goals (Current goals can be found in the Care Plan section)  Acute Rehab PT Goals Patient Stated Goal: Pt's goal is to return home, wants to be with her dogs/cats. PT Goal Formulation: With patient Time For Goal Achievement: 12/21/21 Potential to Achieve Goals: Good    Frequency Min 3X/week   Barriers to discharge   Pt. will be home alone throughout day, but reports that she feels comfortable with return home and use of her equipment.    Co-evaluation               AM-PAC PT "6 Clicks" Mobility  Outcome Measure Help needed turning from your back to your side while in a flat bed without using bedrails?: A Little Help needed moving from lying on your back to sitting on the side of a flat bed without using bedrails?: A Little Help needed moving to and from a bed to a chair (including  a wheelchair)?: A Little Help needed standing up from a chair using your arms (e.g., wheelchair or bedside chair)?: None Help needed to walk in hospital room?: A Little Help needed climbing 3-5 steps with a railing? : A Little 6 Click Score: 19    End of Session Equipment Utilized During Treatment: Gait belt Activity Tolerance: Patient tolerated treatment well;Patient limited by fatigue Patient left: in chair;with call bell/phone within reach;with chair alarm set   PT Visit Diagnosis: Other abnormalities of gait and mobility (R26.89);Muscle weakness (generalized) (M62.81)    Time: 8841-6606 PT Time Calculation (min) (ACUTE ONLY): 25 min   Charges:   PT Evaluation $PT Eval Low Complexity: 1 Low PT Treatments $[place]alith Rep Proc: 8-22 mins       London Tarnowski A. Honour Schwieger, PT, DPT Acute Rehabilitation Services Office: [place] 12/07/2021, 10:49 AM

## 2021-12-07 NOTE — Hospital Course (Signed)
Feels much better this morning, more energy. States that maybe not back to her usual self, but getting close.   Having some back pain, chronic.  Has history of vertigo, but none during hospitalization.   No BM thus far. Has constipation on and off even at home. Thursday was last BM.   No SHOB at this time.

## 2021-12-09 NOTE — Progress Notes (Signed)
Internal Medicine Clinic Attending  Case discussed with Dr. Nguyen  At the time of the visit.  We reviewed the resident's history and exam and pertinent patient test results.  I agree with the assessment, diagnosis, and plan of care documented in the resident's note. 

## 2021-12-10 ENCOUNTER — Telehealth: Payer: Self-pay | Admitting: *Deleted

## 2021-12-10 NOTE — Telephone Encounter (Signed)
Call from [place] with [facility]. Stated she did an evaluation today. Requesting verbal order for "PT 2 times a week x 1 week; 1 time a week x 3 weeks for re-conditioning and strengthen". VO given - if not appropriate, let me know.

## 2021-12-11 NOTE — Progress Notes (Signed)
Carelink Summary Report / Loop Recorder 

## 2021-12-16 ENCOUNTER — Encounter: Payer: Self-pay | Admitting: Student

## 2021-12-16 ENCOUNTER — Other Ambulatory Visit: Payer: Self-pay

## 2021-12-16 ENCOUNTER — Ambulatory Visit (INDEPENDENT_AMBULATORY_CARE_PROVIDER_SITE_OTHER): Payer: 59 | Admitting: Student

## 2021-12-16 VITALS — BP 138/71 | HR 84 | Temp 98.1°F | Ht 64.0 in | Wt 232.4 lb

## 2021-12-16 DIAGNOSIS — M4316 Spondylolisthesis, lumbar region: Secondary | ICD-10-CM

## 2021-12-16 DIAGNOSIS — D500 Iron deficiency anemia secondary to blood loss (chronic): Secondary | ICD-10-CM | POA: Diagnosis not present

## 2021-12-16 DIAGNOSIS — Z794 Long term (current) use of insulin: Secondary | ICD-10-CM | POA: Diagnosis not present

## 2021-12-16 DIAGNOSIS — E119 Type 2 diabetes mellitus without complications: Secondary | ICD-10-CM

## 2021-12-16 DIAGNOSIS — I509 Heart failure, unspecified: Secondary | ICD-10-CM

## 2021-12-16 DIAGNOSIS — I480 Paroxysmal atrial fibrillation: Secondary | ICD-10-CM

## 2021-12-16 DIAGNOSIS — J441 Chronic obstructive pulmonary disease with (acute) exacerbation: Secondary | ICD-10-CM | POA: Diagnosis not present

## 2021-12-16 DIAGNOSIS — Z0000 Encounter for general adult medical examination without abnormal findings: Secondary | ICD-10-CM

## 2021-12-16 DIAGNOSIS — Z23 Encounter for immunization: Secondary | ICD-10-CM | POA: Diagnosis not present

## 2021-12-16 MED ORDER — GABAPENTIN 100 MG PO CAPS: 200.0000 mg | ORAL_CAPSULE | Freq: Three times a day (TID) | ORAL | 2 refills | Status: DC

## 2021-12-16 MED ORDER — EMPAGLIFLOZIN 10 MG PO TABS: 10.0000 mg | ORAL_TABLET | Freq: Every day | ORAL | 0 refills | Status: DC

## 2021-12-16 MED ORDER — RIVAROXABAN 20 MG PO TABS: 20.0000 mg | ORAL_TABLET | Freq: Every day | ORAL | 2 refills | Status: DC

## 2021-12-16 MED ORDER — TRELEGY ELLIPTA 100-62.5-25 MCG/ACT IN AEPB: 1.0000 | INHALATION_SPRAY | Freq: Every day | RESPIRATORY_TRACT | 5 refills | Status: DC

## 2021-12-16 MED ORDER — INSULIN PEN NEEDLE 32G X 4 MM MISC: 1.0000 | Freq: Every morning | 3 refills | Status: DC

## 2021-12-16 NOTE — Assessment & Plan Note (Signed)
A1c was 6 in November.  Patient reports taking Tresiba 50 units at bedtime and Jardiance 10 mg.  Her glucometer log showed fasting glucose values at goal (82%).  No lows.  Patient denies any hypoglycemic symptoms.  -Continue Tresiba 50 units daily and Jardiance 10 mg -Her A1c can be falsely low due to her iron deficiency anemia

## 2021-12-16 NOTE — Progress Notes (Signed)
° °  CC: Follow-up on iron deficiency anemia  HPI:  Caitlyn Lawrence is a 75 y.o. with past medical history of iron deficiency anemia secondary to chronic GI blood loss,, type 2 diabetes who presents to the clinic today for repeat blood work.  Please see problem based charting for detail  Past Medical History:  Diagnosis Date   Angiectasia    of colon and duodenum   CAD (coronary artery disease)    Candidal esophagitis (HCC)    COPD with acute exacerbation ([place])    Diabetes ([place])    Diverticulosis    GI bleed    H. pylori infection 10/2017   Heart failure (HCC)    Hypertension    IDA (iron deficiency anemia)    Internal hemorrhoids    MI (myocardial infarction) (HCC)    Osteoarthritis    Pancreatic steatorrhea    Paroxysmal atrial fibrillation (HCC)    Tubular adenoma of colon    Review of Systems:  per HPI  Physical Exam:  Vitals:   12/16/21 1021  BP: 138/71  Pulse: 84  Temp: 98.1 F (36.7 C)  TempSrc: Oral  SpO2: 99%  Weight: 232 lb 6.4 oz (105.4 kg)  Height: 5\' 4"  (1.626 m)   Physical Exam Constitutional:      General: She is not in acute distress.    Appearance: She is not ill-appearing.  HENT:     Head: Normocephalic.  Eyes:     General:        Right eye: No discharge.        Left eye: No discharge.     Conjunctiva/sclera: Conjunctivae normal.  Cardiovascular:     Rate and Rhythm: Normal rate and regular rhythm.     Heart sounds: Normal heart sounds. No murmur heard. Pulmonary:     Effort: Pulmonary effort is normal.     Breath sounds: Normal breath sounds.     Comments: Very mild wheezing heard at the upper lobes Abdominal:     General: Bowel sounds are normal.     Palpations: Abdomen is soft.  Skin:    General: Skin is warm.  Neurological:     General: No focal deficit present.     Mental Status: She is alert and oriented to person, place, and time.  Psychiatric:        Mood and Affect: Mood normal.        Behavior: Behavior normal.      Assessment & Plan:   See Encounters Tab for problem based charting.  Patient discussed with Dr. Heber Superior

## 2021-12-16 NOTE — Assessment & Plan Note (Signed)
Tdap vaccine given today.  

## 2021-12-16 NOTE — Assessment & Plan Note (Signed)
States that her breathing has improved.  She last saw her pulmonologist in July  -Refill Trelegy

## 2021-12-16 NOTE — Assessment & Plan Note (Addendum)
Patient has iron deficiency anemia secondary to chronic GI blood loss.  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  Patient was admitted to the hospital for symptomatic anemia.  She received 2 transfusion of Ferrlecit.  Xarelto was resumed given benefits higher than risk.  Today patient report doing better.  Denies constipation, melena or hematochezia.  Denies any abdominal pain, nausea or vomiting.  She is still taking her iron supplement p.o.  CBC showed trending down hemoglobin at 8.5 last week.  -Recheck CBC, ferritin, iron panel and reticulocyte count today.  If low, she might need another IV transfusion -Continue ferrous sulfate -Follow-up with GI balloon endoscopy in February.   -Patient will follow up in 1 month for repeat blood work if needed  Addendum Hgb improves to 10.5. Ferritin and Iron study also improves appropriately. Calculated Absolute rec count 3.9 +> adequate response.  -No indication for additional IV iron  -Continue ferrous sulfate

## 2021-12-16 NOTE — Patient Instructions (Signed)
Ms. Hanselman,  It was a pleasure seeing you in the clinic today.  Here is a summary of what we talked about:  1.  Chronic blood loss: We will check your blood count and iron panel today.  If the values are still low, you may need another IV transfusion.  2.  Diabetes: Your blood sugar values are appropriate.  Please continue your regimen.  Please return in 4 weeks  Take care,  Dr. Alfonse Spruce

## 2021-12-16 NOTE — Assessment & Plan Note (Signed)
Continue Xarelto 

## 2021-12-16 NOTE — Progress Notes (Deleted)
[place] GABAPENTIN PEN NEEDLES TRELEGY INHALER

## 2021-12-17 LAB — CBC
Hematocrit: 36.8 % (ref 34.0–46.6)
Hemoglobin: 10.6 g/dL — ABNORMAL LOW (ref 11.1–15.9)
MCH: 22.6 pg — ABNORMAL LOW (ref 26.6–33.0)
MCHC: 28.8 g/dL — ABNORMAL LOW (ref 31.5–35.7)
MCV: 79 fL (ref 79–97)
Platelets: 310 10*3/uL (ref 150–450)
RBC: 4.69 x10E6/uL (ref 3.77–5.28)
RDW: 21.2 % — ABNORMAL HIGH (ref 11.7–15.4)
WBC: 7.8 10*3/uL (ref 3.4–10.8)

## 2021-12-17 LAB — FERRITIN: Ferritin: 287 ng/mL — ABNORMAL HIGH (ref 15–150)

## 2021-12-17 LAB — RETICULOCYTES: Retic Ct Pct: 4.4 % — ABNORMAL HIGH (ref 0.6–2.6)

## 2021-12-17 LAB — IRON AND TIBC
Iron Saturation: 43 % (ref 15–55)
Iron: 173 ug/dL — ABNORMAL HIGH (ref 27–139)
Total Iron Binding Capacity: 407 ug/dL (ref 250–450)
UIBC: 234 ug/dL (ref 118–369)

## 2021-12-17 NOTE — Progress Notes (Signed)
Internal Medicine Clinic Attending  Case discussed with Dr. Nguyen  At the time of the visit.  We reviewed the resident's history and exam and pertinent patient test results.  I agree with the assessment, diagnosis, and plan of care documented in the resident's note. 

## 2021-12-29 ENCOUNTER — Telehealth: Payer: Self-pay | Admitting: Physician Assistant

## 2021-12-31 ENCOUNTER — Emergency Department (HOSPITAL_COMMUNITY): Payer: 59

## 2021-12-31 ENCOUNTER — Emergency Department (HOSPITAL_COMMUNITY)
Admission: EM | Admit: 2021-12-31 | Discharge: 2021-12-31 | Disposition: A | Discharge status: Home / Self Care | Payer: 59 | Attending: Emergency Medicine | Admitting: Emergency Medicine

## 2021-12-31 ENCOUNTER — Encounter (HOSPITAL_COMMUNITY): Payer: Self-pay

## 2021-12-31 ENCOUNTER — Other Ambulatory Visit: Payer: Self-pay

## 2021-12-31 ENCOUNTER — Ambulatory Visit: Payer: 59

## 2021-12-31 DIAGNOSIS — I10 Essential (primary) hypertension: Secondary | ICD-10-CM | POA: Insufficient documentation

## 2021-12-31 DIAGNOSIS — Z7901 Long term (current) use of anticoagulants: Secondary | ICD-10-CM | POA: Insufficient documentation

## 2021-12-31 DIAGNOSIS — R0981 Nasal congestion: Secondary | ICD-10-CM | POA: Insufficient documentation

## 2021-12-31 DIAGNOSIS — R0602 Shortness of breath: Secondary | ICD-10-CM | POA: Diagnosis not present

## 2021-12-31 DIAGNOSIS — Z20822 Contact with and (suspected) exposure to covid-19: Secondary | ICD-10-CM | POA: Insufficient documentation

## 2021-12-31 DIAGNOSIS — Z79899 Other long term (current) drug therapy: Secondary | ICD-10-CM | POA: Diagnosis not present

## 2021-12-31 DIAGNOSIS — R059 Cough, unspecified: Secondary | ICD-10-CM | POA: Insufficient documentation

## 2021-12-31 DIAGNOSIS — R519 Headache, unspecified: Secondary | ICD-10-CM

## 2021-12-31 LAB — DIFFERENTIAL
Abs Immature Granulocytes: 0.02 10*3/uL (ref 0.00–0.07)
Basophils Absolute: 0.1 10*3/uL (ref 0.0–0.1)
Basophils Relative: 1 %
Eosinophils Absolute: 0.3 10*3/uL (ref 0.0–0.5)
Eosinophils Relative: 4 %
Immature Granulocytes: 0 %
Lymphocytes Relative: 12 %
Lymphs Abs: 0.8 10*3/uL (ref 0.7–4.0)
Monocytes Absolute: 0.5 10*3/uL (ref 0.1–1.0)
Monocytes Relative: 8 %
Neutro Abs: 4.9 10*3/uL (ref 1.7–7.7)
Neutrophils Relative %: 75 %

## 2021-12-31 LAB — URINALYSIS, MICROSCOPIC (REFLEX)

## 2021-12-31 LAB — URINALYSIS, ROUTINE W REFLEX MICROSCOPIC
Bilirubin Urine: NEGATIVE
Glucose, UA: >=500 mg/dL — AB
Ketones, ur: NEGATIVE mg/dL
Leukocytes,Ua: NEGATIVE
Nitrite: NEGATIVE
Protein, ur: NEGATIVE mg/dL
Specific Gravity, Urine: <1.005 — ABNORMAL LOW (ref 1.005–1.030)
pH: 6.5 (ref 5.0–8.0)

## 2021-12-31 LAB — COMPREHENSIVE METABOLIC PANEL
ALT: 14 U/L (ref 0–44)
AST: 18 U/L (ref 15–41)
Albumin: 3.7 g/dL (ref 3.5–5.0)
Alkaline Phosphatase: 72 U/L (ref 38–126)
Anion gap: 9 (ref 5–15)
BUN: 7 mg/dL — ABNORMAL LOW (ref 8–23)
CO2: 28 mmol/L (ref 22–32)
Calcium: 9.1 mg/dL (ref 8.9–10.3)
Chloride: 101 mmol/L (ref 98–111)
Creatinine, Ser: 0.64 mg/dL (ref 0.44–1.00)
GFR, Estimated: >60 mL/min (ref >60)
Glucose, Bld: 102 mg/dL — ABNORMAL HIGH (ref 70–99)
Potassium: 3.9 mmol/L (ref 3.5–5.1)
Sodium: 138 mmol/L (ref 135–145)
Total Bilirubin: 0.5 mg/dL (ref 0.3–1.2)
Total Protein: 7.2 g/dL (ref 6.5–8.1)

## 2021-12-31 LAB — RESP PANEL BY RT-PCR (FLU A&B, COVID) ARPGX2
Influenza A by PCR: NEGATIVE
Influenza B by PCR: NEGATIVE
SARS Coronavirus 2 by RT PCR: NEGATIVE

## 2021-12-31 LAB — RAPID URINE DRUG SCREEN, HOSP PERFORMED
Amphetamines: NOT DETECTED
Barbiturates: NOT DETECTED
Benzodiazepines: NOT DETECTED
Cocaine: NOT DETECTED
Opiates: NOT DETECTED
Tetrahydrocannabinol: NOT DETECTED

## 2021-12-31 LAB — I-STAT CHEM 8, ED
BUN: 8 mg/dL (ref 8–23)
Calcium, Ion: 1.16 mmol/L (ref 1.15–1.40)
Chloride: 102 mmol/L (ref 98–111)
Creatinine, Ser: 0.6 mg/dL (ref 0.44–1.00)
Glucose, Bld: 95 mg/dL (ref 70–99)
HCT: 42 % (ref 36.0–46.0)
Hemoglobin: 14.3 g/dL (ref 12.0–15.0)
Potassium: 3.9 mmol/L (ref 3.5–5.1)
Sodium: 141 mmol/L (ref 135–145)
TCO2: 31 mmol/L (ref 22–32)

## 2021-12-31 LAB — CBC
HCT: 40.2 % (ref 36.0–46.0)
Hemoglobin: 12.1 g/dL (ref 12.0–15.0)
MCH: 23.7 pg — ABNORMAL LOW (ref 26.0–34.0)
MCHC: 30.1 g/dL (ref 30.0–36.0)
MCV: 78.8 fL — ABNORMAL LOW (ref 80.0–100.0)
Platelets: 255 10*3/uL (ref 150–400)
RBC: 5.1 MIL/uL (ref 3.87–5.11)
RDW: 23.6 % — ABNORMAL HIGH (ref 11.5–15.5)
WBC: 6.5 10*3/uL (ref 4.0–10.5)
nRBC: 0 % (ref 0.0–0.2)

## 2021-12-31 LAB — PROTIME-INR
INR: 1 (ref 0.8–1.2)
Prothrombin Time: 13.4 seconds (ref 11.4–15.2)

## 2021-12-31 LAB — ETHANOL: Alcohol, Ethyl (B): <10 mg/dL (ref <10)

## 2021-12-31 LAB — APTT: aPTT: 30 seconds (ref 24–36)

## 2021-12-31 MED ORDER — IOHEXOL 350 MG/ML SOLN
50.0000 mL | Freq: Once | INTRAVENOUS | Status: AC | PRN
  Administered 2021-12-31: 50 mL via INTRAVENOUS

## 2021-12-31 NOTE — ED Notes (Signed)
Given water and food

## 2021-12-31 NOTE — ED Provider Notes (Signed)
[facility] EMERGENCY DEPARTMENT Provider Note   CSN: [id number] Arrival date & time: 12/31/21  0857     History  No chief complaint on file.   Caitlyn Lawrence is a 76 y.o. female.  HPI Patient presents today complaining of high blood pressure.  She states that she takes her blood pressure every morning.  She states that it was high this morning about 160 and kept going up.  She then reports that she had a headache.  Review of provider and triage notes are that she complained of headache awakening her.  On my exam she states that she had a high blood pressure first.  She reports to me similar headaches in the past.  She had some nasal congestion and mild coughing.  She denies any fever, chills, vision changes, or lateralized weakness.  She proceeded to take her usual morning medications.     Home Medications Prior to Admission medications   Medication Sig Start Date End Date Taking? Authorizing Provider  albuterol (ACCUNEB) 0.63 MG/3ML nebulizer solution Take 3 mLs (0.63 mg total) by nebulization every 6 (six) hours as needed for wheezing or shortness of breath. 05/23/21   Katsadouros, Vasilios, MD  amLODipine (NORVASC) 2.5 MG tablet Take 2.5 mg by mouth daily. 03/26/21   [provider]  Blood Glucose Monitoring Suppl (ONETOUCH VERIO) w/Device KIT 1 Units by Does not apply route 3 (three) times daily. 04/19/21   Mosetta Anis, MD  Clobetasol Propionate 0.05 % shampoo Please apply to scalp daily Patient taking differently: Apply 1 application topically every other day. 11/18/21   Katsadouros, Vasilios, MD  COMBIVENT RESPIMAT 20-100 MCG/ACT AERS respimat Inhale 1 puff into the lungs 4 (four) times daily. 09/09/21   Margaretha Seeds, MD  empagliflozin (JARDIANCE) 10 MG TABS tablet Take 1 tablet (10 mg total) by mouth daily before breakfast. 12/16/21   Gaylan Gerold, DO  ferrous sulfate 325 (65 FE) MG tablet Take 1 tablet (325 mg total) by mouth every Monday,  Wednesday, and Friday. 08/23/21 02/08/22  Gaylan Gerold, DO  Fluticasone-Umeclidin-Vilant (TRELEGY ELLIPTA) 100-62.5-25 MCG/ACT AEPB Inhale 1 puff into the lungs daily. 12/16/21   Gaylan Gerold, DO  gabapentin (NEURONTIN) 100 MG capsule Take 2 capsules (200 mg total) by mouth 3 (three) times daily. 12/16/21 03/16/22  Gaylan Gerold, DO  glucose blood (ONETOUCH VERIO) test strip 1 each by Other route 3 (three) times daily. Use as instructed 05/23/21   Riesa Pope, MD  insulin degludec (TRESIBA FLEXTOUCH) 100 UNIT/ML FlexTouch Pen Inject 50 Units into the skin at bedtime. 11/18/21 02/16/22  Katsadouros, Vasilios, MD  Insulin Pen Needle 32G X 4 MM MISC Inject 1 each into the skin every morning. Use to inject insulin 4 times a day 12/16/21 06/14/22  Gaylan Gerold, DO  Insulin Syringe-Needle U-100 31G X 15/64" 0.5 ML MISC Please use with insulin 05/29/21   Riesa Pope, MD  Lancets MISC 1 Units by Does not apply route in the morning, at noon, and at bedtime. 04/19/21   Mosetta Anis, MD  losartan (COZAAR) 100 MG tablet Take 1 tablet (100 mg total) by mouth daily. 11/18/21   Katsadouros, Vasilios, MD  lovastatin (MEVACOR) 20 MG tablet Take 20 mg by mouth every evening.    [provider]  metoprolol succinate (TOPROL-XL) 100 MG 24 hr tablet Take 1 tablet (100 mg total) by mouth daily. Take with or immediately following a meal. 05/23/21   Riesa Pope, MD  OneTouch Delica Lancets 30Z [place]  Please check your sugars 4x daily. 05/23/21   Katsadouros, Vasilios, MD  polyethylene glycol (MIRALAX / GLYCOLAX) 17 g packet Take 17 g by mouth as needed for mild constipation. Patient taking differently: Take 17 g by mouth daily as needed for mild constipation. 05/07/21   Pyrtle, Lajuan Lines, MD  rivaroxaban (XARELTO) 20 MG TABS tablet Take 1 tablet (20 mg total) by mouth daily. 12/16/21 03/16/22  Gaylan Gerold, DO  sodium chloride (OCEAN) 0.65 % SOLN nasal spray Place 1 spray into both nostrils 3 (three)  times daily as needed for congestion.    [provider]  Vitamin D, Ergocalciferol, (DRISDOL) 1.25 MG (50000 UNIT) CAPS capsule Take 50,000 Units by mouth every 7 (seven) days. Mondays    [provider]      Allergies    Sulfa antibiotics and Penicillins    Review of Systems   Review of Systems  All other systems reviewed and are negative.  Physical Exam Updated Vital Signs BP (!) 126/108    Pulse 80    Temp 97.9 F (36.6 C) (Oral)    Resp (!) 22    SpO2 95%  Physical Exam Vitals reviewed.  Constitutional:      General: She is not in acute distress.    Appearance: She is not ill-appearing.  HENT:     Head: Normocephalic.     Right Ear: External ear normal.     Left Ear: External ear normal.     Nose: Nose normal.     Mouth/Throat:     Pharynx: Oropharynx is clear.  Eyes:     Extraocular Movements: Extraocular movements intact.     Pupils: Pupils are equal, round, and reactive to light.  Cardiovascular:     Rate and Rhythm: Normal rate.  Pulmonary:     Effort: Pulmonary effort is normal.  Abdominal:     General: Abdomen is flat.     Palpations: Abdomen is soft.  Musculoskeletal:        General: Normal range of motion.     Cervical back: Normal range of motion.  Skin:    General: Skin is warm and dry.     Capillary Refill: Capillary refill takes less than 2 seconds.  Neurological:     Mental Status: She is alert.     Cranial Nerves: No cranial nerve deficit.     Sensory: No sensory deficit.     Motor: No weakness.     Coordination: Coordination normal.     Gait: Gait normal.     Deep Tendon Reflexes: Reflexes normal.  Psychiatric:        Mood and Affect: Mood normal.        Behavior: Behavior normal.    ED Results / Procedures / Treatments   Labs (all labs ordered are listed, but only abnormal results are displayed) Labs Reviewed  CBC - Abnormal; Notable for the following components:      Result Value   MCV 78.8 (*)    MCH 23.7 (*)     RDW 23.6 (*)    All other components within normal limits  COMPREHENSIVE METABOLIC PANEL - Abnormal; Notable for the following components:   Glucose, Bld 102 (*)    BUN 7 (*)    All other components within normal limits  URINALYSIS, ROUTINE W REFLEX MICROSCOPIC - Abnormal; Notable for the following components:   Specific Gravity, Urine <1.005 (*)    Glucose, UA >=500 (*)    Hgb urine dipstick TRACE (*)  All other components within normal limits  URINALYSIS, MICROSCOPIC (REFLEX) - Abnormal; Notable for the following components:   Bacteria, UA RARE (*)    All other components within normal limits  RESP PANEL BY RT-PCR (FLU A&B, COVID) ARPGX2  ETHANOL  PROTIME-INR  APTT  DIFFERENTIAL  RAPID URINE DRUG SCREEN, HOSP PERFORMED  I-STAT CHEM 8, ED    EKG EKG Interpretation  Date/Time:  Tuesday December 31 2021 09:06:28 EST Ventricular Rate:  74 PR Interval:  132 QRS Duration: 142 QT Interval:  436 QTC Calculation: 483 R Axis:   -22 Text Interpretation: Sinus rhythm with occasional Premature ventricular complexes Right bundle branch block Abnormal ECG When compared with ECG of 02-Sep-2021 11:18, PREVIOUS ECG IS PRESENT Confirmed by Pattricia Boss [id number] on 12/31/2021 12:13:45 PM  Radiology CT ANGIO HEAD NECK W WO CM  Result Date: 12/31/2021 CLINICAL DATA:  Headache, sudden, severe EXAM: CT ANGIOGRAPHY HEAD AND NECK TECHNIQUE: Multidetector CT imaging of the head and neck was performed using the standard protocol during bolus administration of intravenous contrast. Multiplanar CT image reconstructions and MIPs were obtained to evaluate the vascular anatomy. Carotid stenosis measurements (when applicable) are obtained utilizing NASCET criteria, using the distal internal carotid diameter as the denominator. CONTRAST:  24m OMNIPAQUE IOHEXOL 350 MG/ML SOLN COMPARISON:  None. FINDINGS: CT HEAD Brain: There is no acute intracranial hemorrhage, mass effect, or edema. Overall gray-white  differentiation is preserved. There are chronic small vessel infarcts of the left basal ganglia. Small chronic right cerebellar infarct. There is no extra-axial fluid collection. Ventricles and sulci are within normal limits in size and configuration. Vascular: No hyperdense vessel. Skull: Small osteoma of the left parietal calvarium. Sinuses/Orbits: Nonaggressive fibro-osseous lesion of the right frontal sinus. Other: Mastoid air cells are clear. Review of the MIP images confirms the above findings CTA NECK Aortic arch: Great vessel origins are patent. Right carotid system: Patent. Cervical internal carotid is mildly tortuous. No stenosis. Left carotid system: Patent. Mild calcified plaque at the ICA origin. No stenosis. Vertebral arteries: Patent and codominant.  No stenosis. Skeleton: Cervical spine degenerative changes. Other neck: None. Upper chest: Mild emphysema.  No apical lung mass. Review of the MIP images confirms the above findings CTA HEAD Anterior circulation: Intracranial internal carotid arteries are patent with calcified plaque. There is moderate stenosis on the left. Anterior and middle cerebral arteries are patent. Posterior circulation: Intracranial vertebral arteries are patent. Basilar artery is patent. Major cerebellar artery origins are patent. Posterior cerebral arteries are patent. Venous sinuses: Patent as allowed by contrast bolus timing. Review of the MIP images confirms the above findings IMPRESSION: No acute intracranial abnormality. 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 No large vessel occlusion, hemodynamically significant stenosis, or evidence of dissection. No aneurysm. Electronically Signed   By: PMacy MisM.D.   On: 12/31/2021 11:31    Procedures Procedures    Medications Ordered in ED Medications  iohexol (OMNIPAQUE) 350 MG/ML injection 50 mL (50 mLs Intravenous Contrast Given 12/31/21 1107)    ED Course/ Medical Decision  Making/ A&P Clinical Course as of 12/31/21 1334  Tue Dec 31, 2021  1212 CBC reviewed within normal limits C-Met reviewed and within normal limits INR personally reviewed and interpreted normal at 1.0 Urinalysis reviewed and shows greater than 500 glucose with rare bacteria 0-5 white blood cells.  I doubt this represents infection given patient's lack of UTI symptoms [DR]  1212 CTA of head reviewed reviewed.  This was ordered  in triage.  Was ordered to evaluate for stroke or acute bleeding.  There is not appear to be any bleeding or acute abnormalities noted per radiology report [DR]    Clinical Course User Index [DR] Pattricia Boss, MD                           Medical Decision Making 76 year old female with known hypertension presents today complaining of high blood pressure and headaches.  Per history it appeared that the increased blood pressure may have preceded the headache.  It was difficult to evaluate for sure.  She took her normal medications and her blood pressure has decreased today.  Was down to systolically 225 when I was in the room.  He does not have any acute neurological deficits.  Labs and imaging are reviewed.  Her headache has resolved.  CT was obtained to evaluate for bleeding or stroke.  Labs were obtained to evaluate for other acute abnormalities including infection metabolic abnormalities and other contributing factors.  INR was obtained to evaluate risk for bleeding.  Amount and/or Complexity of Data Reviewed Labs: ordered. Decision-making details documented in ED Course. Radiology: ordered and independent interpretation performed. Decision-making details documented in ED Course. ECG/medicine tests: ordered and independent interpretation performed. Decision-making details documented in ED Course. Discussion of management or test interpretation with external provider(s): Patient without any evidence of acute bleeding, stroke, infection, hypertensive emergency or urgency.   Blood pressure is responding nicely to her usual medications.  Her headache has resolved Intervention.  Patient did present with life-threatening symptoms of headache and hypertension which could have represented hypertensive emergency, stroke, or bleeding.  These were ruled out with imaging, labs, and physical exam.  Patient has had some nasal congestions hence COVID test is ordered and pending.  Plan discharge after the prescription result.          Final Clinical Impression(s) / ED Diagnoses Final diagnoses:  Nonintractable headache, unspecified chronicity pattern, unspecified headache type  Hypertension, unspecified type    Rx / DC Orders ED Discharge Orders     None         Pattricia Boss, MD 12/31/21 1334

## 2021-12-31 NOTE — Discharge Instructions (Addendum)
Your blood pressure has come down we do not want to do any more lowering of it here in the ED. Please continue to keep a log of your blood pressure and take your blood pressure medication as prescribed Please follow-up with your primary care doctor. Return if you are having worsening symptoms especially severe headache, chest pain, shortness of breath.

## 2021-12-31 NOTE — ED Triage Notes (Signed)
Patient arrived by [facility] after being awakened this am 0400 with headache, dizziness and nausea. Complains that the dizziness with sitting has resolved but has some ongoing nausea. EMS reports BP 192/98 and patient has taken BP med. Alert and oriented, clear speech.

## 2021-12-31 NOTE — ED Provider Notes (Signed)
Emergency Medicine Provider Triage Evaluation Note  Caitlyn Lawrence , a 76 y.o. female  was evaluated in triage.  Pt complains of headache. She states that same awoke her from sleep at 4 am this morning. Headache is in the back of her head and down into the left side of her neck. She states she was also already dizzy when she woke up this morning but that has since resolved. She is also nauseous. History of similar, states when she was worked up in the past they suspected she had a TIA. Family history of aneurysm in her mom which is her primary concern.  Review of Systems  Positive: Headache Negative: Fever, chills  Physical Exam  BP (!) 160/91 (BP Location: Right Arm)    Pulse 77    Temp 97.9 F (36.6 C) (Oral)    Resp 17    SpO2 92%  Gen:   Awake, no distress   Resp:  Normal effort  MSK:   Moves extremities without difficulty  Other:  Alert and oriented, neurologically intact  Medical Decision Making  Medically screening exam initiated at 9:26 AM.  Appropriate orders placed.  [name] was informed that the remainder of the evaluation will be completed by another provider, this initial triage assessment does not replace that evaluation, and the importance of remaining in the ED until their evaluation is complete.  CTA head and neck to r/o dissection, aneurysm given pain in back of head and neck.   Nestor Lewandowsky 12/31/21 6333    Pattricia Boss, MD 12/31/21 [phone]

## 2022-01-02 ENCOUNTER — Ambulatory Visit (INDEPENDENT_AMBULATORY_CARE_PROVIDER_SITE_OTHER): Payer: 59

## 2022-01-02 DIAGNOSIS — R55 Syncope and collapse: Secondary | ICD-10-CM | POA: Diagnosis not present

## 2022-01-02 LAB — CUP PACEART REMOTE DEVICE CHECK: Date Time Interrogation Session: 20230104231108

## 2022-01-09 ENCOUNTER — Ambulatory Visit: Payer: 59

## 2022-01-09 ENCOUNTER — Other Ambulatory Visit: Payer: Self-pay

## 2022-01-09 DIAGNOSIS — E0840 Diabetes mellitus due to underlying condition with diabetic neuropathy, unspecified: Secondary | ICD-10-CM

## 2022-01-09 DIAGNOSIS — M2041 Other hammer toe(s) (acquired), right foot: Secondary | ICD-10-CM

## 2022-01-09 DIAGNOSIS — M2042 Other hammer toe(s) (acquired), left foot: Secondary | ICD-10-CM

## 2022-01-09 NOTE — Progress Notes (Signed)
SITUATION Reason for Consult: Evaluation for Prefabricated Diabetic Shoes and Bilateral Custom Diabetic Inserts. Patient / Caregiver Report: Patient would like well fitting shoes  OBJECTIVE DATA: Patient History / Diagnosis:    ICD-10-CM   1. Diabetes mellitus due to underlying condition, controlled, with diabetic neuropathy, unspecified whether long term insulin use (HCC)  E08.40     2. Hammertoes of both feet  M20.41    M20.42       Current or Previous Devices:   None, last pair of shoes were long enough she has forgotten  In-Person Foot Examination: Ulcers & Callousing:   None  Toe / Foot Deformities:   - Pes Planus - Hammertoes   Shoe Size: 10.32M  ORTHOTIC RECOMMENDATION Recommended Devices: - 1x pair prefabricated PDAC approved diabetic shoes: [id number] 10.32M - 3x pair custom-to-patient vacuum formed diabetic insoles.   GOALS OF SHOES AND INSOLES - Reduce shear and pressure - Reduce / Prevent callus formation - Reduce / Prevent ulceration - Protect the fragile healing compromised diabetic foot.  Patient would benefit from diabetic shoes and inserts as patient has diabetes mellitus and the patient has one or more of the following conditions: - History of partial or complete amputation of the foot - History of previous foot ulceration. - History of pre-ulcerative callus - Peripheral neuropathy with evidence of callus formation - Foot deformity - Poor circulation  ACTIONS PERFORMED Patient was casted for insoles via crush box and measured for shoes via brannock device. Procedure was explained and patient tolerated procedure well. All questions were answered and concerns addressed.  PLAN Patient is to ensure treating physician receives and completes diabetic paperwork. Casts and shoe order are to be held until paperwork is received. Once received patient is to be scheduled for fitting in four weeks.

## 2022-01-13 NOTE — Progress Notes (Signed)
Carelink Summary Report / Loop Recorder 

## 2022-01-19 ENCOUNTER — Ambulatory Visit (HOSPITAL_BASED_OUTPATIENT_CLINIC_OR_DEPARTMENT_OTHER): Payer: 59 | Attending: Cardiology | Admitting: Cardiovascular Disease

## 2022-02-03 ENCOUNTER — Ambulatory Visit (AMBULATORY_SURGERY_CENTER): Payer: 59 | Admitting: *Deleted

## 2022-02-03 ENCOUNTER — Other Ambulatory Visit: Payer: Self-pay

## 2022-02-03 ENCOUNTER — Ambulatory Visit (INDEPENDENT_AMBULATORY_CARE_PROVIDER_SITE_OTHER): Payer: 59

## 2022-02-03 VITALS — Ht 64.0 in | Wt 232.0 lb

## 2022-02-03 DIAGNOSIS — R55 Syncope and collapse: Secondary | ICD-10-CM

## 2022-02-03 DIAGNOSIS — D500 Iron deficiency anemia secondary to blood loss (chronic): Secondary | ICD-10-CM

## 2022-02-03 NOTE — Progress Notes (Signed)
Patient's pre-visit was done today over the phone with the patient. Name,DOB and address verified. Patient denies any allergies to Eggs and Soy. Patient denies any problems with anesthesia/sedation. Patient is not taking any diet pills, pt aware to stop  blood thinner Xarelto 2 days prior procedure. No home Oxygen.   Prep instructions mailed to pt-pt aware. Patient understands to call us back with any questions or concerns. Patient is aware of our care-partner policy and [id number] safety protocol.  The patient is COVID-19 vaccinated.

## 2022-02-04 LAB — CUP PACEART REMOTE DEVICE CHECK: Date Time Interrogation Session: 20230206231118

## 2022-02-05 NOTE — Progress Notes (Signed)
Carelink Summary Report / Loop Recorder 

## 2022-02-11 ENCOUNTER — Other Ambulatory Visit: Payer: Self-pay

## 2022-02-11 ENCOUNTER — Ambulatory Visit (INDEPENDENT_AMBULATORY_CARE_PROVIDER_SITE_OTHER): Payer: 59 | Admitting: Internal Medicine

## 2022-02-11 ENCOUNTER — Encounter: Payer: Self-pay | Admitting: Internal Medicine

## 2022-02-11 VITALS — BP 139/78 | HR 74 | Temp 98.5°F | Ht 64.0 in | Wt 225.0 lb

## 2022-02-11 DIAGNOSIS — Z0000 Encounter for general adult medical examination without abnormal findings: Secondary | ICD-10-CM

## 2022-02-11 DIAGNOSIS — B029 Zoster without complications: Secondary | ICD-10-CM | POA: Diagnosis not present

## 2022-02-11 DIAGNOSIS — Z8719 Personal history of other diseases of the digestive system: Secondary | ICD-10-CM

## 2022-02-11 DIAGNOSIS — I159 Secondary hypertension, unspecified: Secondary | ICD-10-CM | POA: Diagnosis not present

## 2022-02-11 DIAGNOSIS — M25511 Pain in right shoulder: Secondary | ICD-10-CM | POA: Diagnosis not present

## 2022-02-11 NOTE — Progress Notes (Signed)
° °  CC: right shoulder pain   HPI:  [name] is a 76 y.o. female with PMHx as stated below presenting for right shoulder pain for three weeks duration. Please see problem based charting for complete assessment and plan.  Past Medical History:  Diagnosis Date   Angiectasia    of colon and duodenum   Blood transfusion without reported diagnosis    CAD (coronary artery disease)    Candidal esophagitis (HCC)    COPD with acute exacerbation ([place])    Diabetes ([place])    Diverticulosis    GI bleed    H. pylori infection 10/2017   Heart failure (HCC)    Hypertension    IDA (iron deficiency anemia)    Internal hemorrhoids    MI (myocardial infarction) ([place])    Osteoarthritis    Pancreatic steatorrhea    Paroxysmal atrial fibrillation (HCC)    TIA (transient ischemic attack) 2020   Tubular adenoma of colon    Review of Systems:  Negative except as stated in HPI.  Physical Exam:  Vitals:   02/11/22 1506  BP: 139/78  Pulse: 74  Temp: 98.5 F (36.9 C)  TempSrc: Oral  SpO2: 98%  Weight: 225 lb (102.1 kg)  Height: 5\' 4"  (1.626 m)   Physical Exam  Constitutional: Appears well-developed and well-nourished. No distress.  Cardiovascular: Normal rate, regular rhythm, S1 and S2 present, no murmurs, rubs, gallops.  Distal pulses intact Respiratory:  Lungs are clear to auscultation bilaterally. Musculoskeletal: Normal bulk and tone.  No peripheral edema noted. Neurological: Is alert and oriented x4, no apparent focal deficits noted. Skin: Warm and dry.  Hyperpigmentation at posterior right shoulder with healing vesicles; no signs of infection or active lesions noted  Psychiatric: Normal mood and affect. Behavior is normal. Judgment and thought content normal.   Assessment & Plan:   See Encounters Tab for problem based charting.  Patient discussed with Dr.  Philipp Ovens

## 2022-02-11 NOTE — Patient Instructions (Addendum)
Ms Caitlyn Lawrence,  It was a pleasure seeing you in clinic. Today we discussed:   Right shoulder pain:  This was likely due to a shingles flare. Continue with supportive treatment as you have been doing. Monitor for any new lesions appearing - if they do, please let us know. Once this has resolved, you may get the shingles vaccine at your earliest [place] maintenance: I am ordering a DEXA scan to screen for osteoporosis  If you have any questions or concerns, please call our clinic at [phone] between 9am-5pm and after hours call [phone] and ask for the internal medicine resident on call. If you feel you are having a medical emergency please call 911.   Thank you, we look forward to helping you remain healthy!

## 2022-02-12 ENCOUNTER — Encounter (HOSPITAL_COMMUNITY): Payer: Self-pay | Admitting: Gastroenterology

## 2022-02-12 DIAGNOSIS — B029 Zoster without complications: Secondary | ICD-10-CM | POA: Insufficient documentation

## 2022-02-12 NOTE — Assessment & Plan Note (Signed)
Patient with history of GI bleed with findings of ectasia throughout colon. She is currently on Xarelto. Patient denies any current episodes of melena, hematochezia or any other bleeding. She has balloon enteroscopy scheduled with GI on 2/23.

## 2022-02-12 NOTE — Assessment & Plan Note (Signed)
DEXA ordered.  

## 2022-02-12 NOTE — Assessment & Plan Note (Signed)
Patient presenting with three weeks of right shoulder pain and rash that has been improving. She notes that she first noted itching and burning in the region about three weeks ago and has been using calamine lotion, topical neosporin and oral benadryl to help with the symptoms. She reports some improvement but continues to have some itching at times. On examination, has area of hyperpigmentation with healing vesicles along the posterior aspect of right shoulder. No active lesions noted and no signs of infection at this time. Patient has not received her shingles vaccine yet.   Plan: Continue supportive care Shingles vaccine once current outbreak has resolved

## 2022-02-12 NOTE — Assessment & Plan Note (Signed)
BP Readings from Last 3 Encounters:  02/11/22 139/78  12/31/21 (!) 126/108  12/16/21 138/71   Patient's antihypertensive regimen includes amlodipine 2.5mg  daily, metoprolol 100mg  daily, losartan 100mg . Patient is not currently on Imdur per cardiologist recommendations. Blood pressure is currently at goal. She reports that her systolic blood pressure usually runs in the 150s prior to taking her medications but improves with medication administration. She denies any symptoms at this time.  Plan: Continue current regimen

## 2022-02-13 NOTE — Progress Notes (Signed)
Internal Medicine Clinic Attending ° °Case discussed with Dr. Aslam  At the time of the visit.  We reviewed the resident’s history and exam and pertinent patient test results.  I agree with the assessment, diagnosis, and plan of care documented in the resident’s note.  °

## 2022-02-19 NOTE — Anesthesia Preprocedure Evaluation (Addendum)
Anesthesia Evaluation  Patient identified by MRN, date of birth, ID band Patient awake    Reviewed: Allergy & Precautions, NPO status , Patient's Chart, lab work & pertinent test results, reviewed documented beta blocker date and time   Airway Mallampati: III  TM Distance: >3 FB Neck ROM: Full    Dental  (+) Edentulous Upper, Edentulous Lower   Pulmonary sleep apnea , COPD,  COPD inhaler, Current Smoker and Patient abstained from smoking.,    Pulmonary exam normal breath sounds clear to auscultation       Cardiovascular hypertension, Pt. on medications and Pt. on home beta blockers + CAD, + Past MI and + CABG  Normal cardiovascular exam+ dysrhythmias Atrial Fibrillation  Rhythm:Regular Rate:Normal  S/p loop recorder   Neuro/Psych TIAnegative psych ROS   GI/Hepatic negative GI ROS, Neg liver ROS,   Endo/Other  diabetes, Type 2, Oral Hypoglycemic Agents, Insulin Dependent  Renal/GU negative Renal ROS  negative genitourinary   Musculoskeletal  (+) Arthritis , Fibromyalgia -  Abdominal   Peds  Hematology  (+) Blood dyscrasia, anemia , On xarelto   Anesthesia Other Findings   Reproductive/Obstetrics                            Anesthesia Physical Anesthesia Plan  ASA: 3  Anesthesia Plan: MAC   Post-op Pain Management:    Induction: Intravenous  PONV Risk Score and Plan: Propofol infusion and Treatment may vary due to age or medical condition  Airway Management Planned: Natural Airway  Additional Equipment:   Intra-op Plan:   Post-operative Plan:   Informed Consent: I have reviewed the patients History and Physical, chart, labs and discussed the procedure including the risks, benefits and alternatives for the proposed anesthesia with the patient or authorized representative who has indicated his/her understanding and acceptance.     Dental advisory given  Plan Discussed with:  CRNA  Anesthesia Plan Comments:         Anesthesia Quick Evaluation

## 2022-02-20 ENCOUNTER — Encounter (HOSPITAL_COMMUNITY)
Admission: RE | Disposition: A | Discharge status: Home / Self Care | Payer: Self-pay | Source: Home / Self Care | Attending: Gastroenterology

## 2022-02-20 ENCOUNTER — Ambulatory Visit (HOSPITAL_COMMUNITY): Payer: 59 | Admitting: Anesthesiology

## 2022-02-20 ENCOUNTER — Other Ambulatory Visit: Payer: Self-pay

## 2022-02-20 ENCOUNTER — Ambulatory Visit (HOSPITAL_COMMUNITY)
Admission: RE | Admit: 2022-02-20 | Discharge: 2022-02-20 | Disposition: A | Discharge status: Home / Self Care | Payer: 59 | Attending: Gastroenterology | Admitting: Gastroenterology

## 2022-02-20 ENCOUNTER — Ambulatory Visit (HOSPITAL_BASED_OUTPATIENT_CLINIC_OR_DEPARTMENT_OTHER): Payer: 59 | Admitting: Anesthesiology

## 2022-02-20 ENCOUNTER — Encounter (HOSPITAL_COMMUNITY): Payer: Self-pay | Admitting: Gastroenterology

## 2022-02-20 DIAGNOSIS — K5521 Angiodysplasia of colon with hemorrhage: Secondary | ICD-10-CM | POA: Diagnosis not present

## 2022-02-20 DIAGNOSIS — F17210 Nicotine dependence, cigarettes, uncomplicated: Secondary | ICD-10-CM | POA: Insufficient documentation

## 2022-02-20 DIAGNOSIS — I2510 Atherosclerotic heart disease of native coronary artery without angina pectoris: Secondary | ICD-10-CM | POA: Diagnosis not present

## 2022-02-20 DIAGNOSIS — I252 Old myocardial infarction: Secondary | ICD-10-CM | POA: Diagnosis not present

## 2022-02-20 DIAGNOSIS — Q2739 Arteriovenous malformation, other site: Secondary | ICD-10-CM | POA: Insufficient documentation

## 2022-02-20 DIAGNOSIS — Z7951 Long term (current) use of inhaled steroids: Secondary | ICD-10-CM | POA: Insufficient documentation

## 2022-02-20 DIAGNOSIS — I509 Heart failure, unspecified: Secondary | ICD-10-CM | POA: Insufficient documentation

## 2022-02-20 DIAGNOSIS — Z7984 Long term (current) use of oral hypoglycemic drugs: Secondary | ICD-10-CM | POA: Diagnosis not present

## 2022-02-20 DIAGNOSIS — J449 Chronic obstructive pulmonary disease, unspecified: Secondary | ICD-10-CM | POA: Diagnosis not present

## 2022-02-20 DIAGNOSIS — D649 Anemia, unspecified: Secondary | ICD-10-CM | POA: Diagnosis not present

## 2022-02-20 DIAGNOSIS — K259 Gastric ulcer, unspecified as acute or chronic, without hemorrhage or perforation: Secondary | ICD-10-CM

## 2022-02-20 DIAGNOSIS — E119 Type 2 diabetes mellitus without complications: Secondary | ICD-10-CM | POA: Diagnosis not present

## 2022-02-20 DIAGNOSIS — Z794 Long term (current) use of insulin: Secondary | ICD-10-CM | POA: Insufficient documentation

## 2022-02-20 DIAGNOSIS — Z79899 Other long term (current) drug therapy: Secondary | ICD-10-CM | POA: Diagnosis not present

## 2022-02-20 DIAGNOSIS — G4730 Sleep apnea, unspecified: Secondary | ICD-10-CM | POA: Diagnosis not present

## 2022-02-20 DIAGNOSIS — D500 Iron deficiency anemia secondary to blood loss (chronic): Secondary | ICD-10-CM

## 2022-02-20 DIAGNOSIS — K5520 Angiodysplasia of colon without hemorrhage: Secondary | ICD-10-CM

## 2022-02-20 DIAGNOSIS — Z7902 Long term (current) use of antithrombotics/antiplatelets: Secondary | ICD-10-CM | POA: Insufficient documentation

## 2022-02-20 DIAGNOSIS — Z951 Presence of aortocoronary bypass graft: Secondary | ICD-10-CM | POA: Insufficient documentation

## 2022-02-20 DIAGNOSIS — I110 Hypertensive heart disease with heart failure: Secondary | ICD-10-CM | POA: Insufficient documentation

## 2022-02-20 DIAGNOSIS — D509 Iron deficiency anemia, unspecified: Secondary | ICD-10-CM | POA: Diagnosis not present

## 2022-02-20 DIAGNOSIS — I480 Paroxysmal atrial fibrillation: Secondary | ICD-10-CM | POA: Insufficient documentation

## 2022-02-20 DIAGNOSIS — M797 Fibromyalgia: Secondary | ICD-10-CM | POA: Diagnosis not present

## 2022-02-20 HISTORY — PX: BALLOON ENTEROSCOPY: SHX6863

## 2022-02-20 HISTORY — PX: HOT HEMOSTASIS: SHX5433

## 2022-02-20 HISTORY — PX: SUBMUCOSAL TATTOO INJECTION: SHX6856

## 2022-02-20 HISTORY — PX: HEMOSTASIS CLIP PLACEMENT: SHX6857

## 2022-02-20 HISTORY — PX: BIOPSY: SHX5522

## 2022-02-20 HISTORY — PX: ENTEROSCOPY: SHX5533

## 2022-02-20 LAB — GLUCOSE, CAPILLARY: Glucose-Capillary: 166 mg/dL — ABNORMAL HIGH (ref 70–99)

## 2022-02-20 SURGERY — ENTEROSCOPY, USING BALLOON
Anesthesia: Monitor Anesthesia Care

## 2022-02-20 MED ORDER — PROPOFOL 500 MG/50ML IV EMUL
INTRAVENOUS | Status: DC | PRN
Start: 2022-02-20 — End: 2022-02-20
  Administered 2022-02-20: 125 ug/kg/min via INTRAVENOUS

## 2022-02-20 MED ORDER — ALBUTEROL SULFATE HFA 108 (90 BASE) MCG/ACT IN AERS
INHALATION_SPRAY | RESPIRATORY_TRACT | Status: DC | PRN
  Administered 2022-02-20 (×2): 2 via RESPIRATORY_TRACT

## 2022-02-20 MED ORDER — PHENYLEPHRINE HCL (PRESSORS) 10 MG/ML IV SOLN
INTRAVENOUS | Status: AC
  Filled 2022-02-20: qty 1

## 2022-02-20 MED ORDER — PROPOFOL 1000 MG/100ML IV EMUL
INTRAVENOUS | Status: AC
Start: 2022-02-20 — End: ?
  Filled 2022-02-20: qty 100

## 2022-02-20 MED ORDER — SODIUM CHLORIDE 0.9 % IV SOLN: INTRAVENOUS | Status: DC

## 2022-02-20 MED ORDER — LACTATED RINGERS IV SOLN: INTRAVENOUS | Status: DC | PRN

## 2022-02-20 MED ORDER — PROPOFOL 500 MG/50ML IV EMUL
INTRAVENOUS | Status: AC
Start: 2022-02-20 — End: ?
  Filled 2022-02-20: qty 300

## 2022-02-20 MED ORDER — LIDOCAINE 2% (20 MG/ML) 5 ML SYRINGE
INTRAMUSCULAR | Status: DC | PRN
Start: 2022-02-20 — End: 2022-02-20
  Administered 2022-02-20: 40 mg via INTRAVENOUS
  Administered 2022-02-20: 20 mg via INTRAVENOUS

## 2022-02-20 MED ORDER — ALBUTEROL SULFATE HFA 108 (90 BASE) MCG/ACT IN AERS
INHALATION_SPRAY | RESPIRATORY_TRACT | Status: AC
  Filled 2022-02-20: qty 6.7

## 2022-02-20 MED ORDER — SPOT INK MARKER SYRINGE KIT
PACK | SUBMUCOSAL | Status: DC | PRN
  Administered 2022-02-20: 2 mL via SUBMUCOSAL

## 2022-02-20 MED ORDER — SPOT INK MARKER SYRINGE KIT
PACK | SUBMUCOSAL | Status: AC
  Filled 2022-02-20: qty 5

## 2022-02-20 MED ORDER — PROPOFOL 10 MG/ML IV BOLUS
INTRAVENOUS | Status: DC | PRN
  Administered 2022-02-20: 20 mg via INTRAVENOUS
  Administered 2022-02-20: 40 mg via INTRAVENOUS
  Administered 2022-02-20: 20 mg via INTRAVENOUS

## 2022-02-20 NOTE — Transfer of Care (Signed)
Immediate Anesthesia Transfer of Care Note  Patient: Caitlyn Lawrence  Procedure(s) Performed: BALLOON ENTEROSCOPY ENTEROSCOPY HOT HEMOSTASIS (ARGON PLASMA COAGULATION/BICAP) SUBMUCOSAL TATTOO INJECTION HEMOSTASIS CLIP PLACEMENT BIOPSY  Patient Location: PACU and Endoscopy Unit  Anesthesia Type:MAC  Level of Consciousness: awake and alert   Airway & Oxygen Therapy: Patient Spontanous Breathing and Patient connected to face mask oxygen  Post-op Assessment: Report given to RN and Post -op Vital signs reviewed and stable  Post vital signs: Reviewed and stable  Last Vitals:  Vitals Value Taken Time  BP    Temp    Pulse 101 02/20/22 0839  Resp 20 02/20/22 0839  SpO2 97 % 02/20/22 0839  Vitals shown include unvalidated device data.  Last Pain:  Vitals:   02/20/22 0708  TempSrc: Oral  PainSc: 0-No pain         Complications: No notable events documented.

## 2022-02-20 NOTE — Interval H&P Note (Signed)
History and Physical Interval Note:  02/20/2022 7:26 AM  Caitlyn Lawrence  has presented today for surgery, with the diagnosis of Anemia.  The various methods of treatment have been discussed with the patient and family. After consideration of risks, benefits and other options for treatment, the patient has consented to  Procedure(s): BALLOON ENTEROSCOPY (N/A) ENTEROSCOPY (N/A) as a surgical intervention.  The patient's history has been reviewed, patient examined, no change in status, stable for surgery.  I have reviewed the patient's chart and labs.  Questions were answered to the patient's satisfaction.     Dominic Pea Ellyce Lafevers

## 2022-02-20 NOTE — Op Note (Addendum)
[facility] Patient Name: Caitlyn Lawrence Procedure Date: 02/20/2022 MRN: [id number] Attending MD: Gerrit Heck , MD Date of Birth: 1946/12/18 CSN: [id number] Age: 76 Admit Type: Outpatient Procedure:                Small bowel enteroscopy (Single Balloon Enteroscopy) Indications:              Abnormal video capsule endoscopy, Iron deficiency                            anemia secondary to chronic blood loss                           EGD and Colonoscopy in 03/2021 with AVMs, treated                            with APC. Subsequent VCE in 10/2021 with gastric                            ulcer and small bowel AVMs. She presents today for                            single balloon enteroscopy for diagnostic and                            therapeutic intent. Providers:                Gerrit Heck, MD, Dulcy Fanny, Luan Moore, Technician, Glenis Smoker, CRNA Referring MD:              Medicines:                Monitored Anesthesia Care Complications:            No immediate complications. Estimated Blood Loss:     Estimated blood loss was minimal. Procedure:                Pre-Anesthesia Assessment:                           - Prior to the procedure, a History and Physical                            was performed, and patient medications and                            allergies were reviewed. The patient's tolerance of                            previous anesthesia was also reviewed. The risks                            and benefits of the procedure and the sedation  options and risks were discussed with the patient.                            All questions were answered, and informed consent                            was obtained. Prior Anticoagulants: The patient has                            taken Xarelto (rivaroxaban), last dose was 2 days                            prior to procedure. ASA Grade  Assessment: III - A                            patient with severe systemic disease. After                            reviewing the risks and benefits, the patient was                            deemed in satisfactory condition to undergo the                            procedure.                           After obtaining informed consent, the endoscope was                            passed under direct vision. Throughout the                            procedure, the patient's blood pressure, pulse, and                            oxygen saturations were monitored continuously. The                            [id number]-[id number] ([id number]) Olympus enteroscope was                            introduced through the mouth and advanced to the                            mid-jejunum. The small bowel enteroscopy was                            accomplished without difficulty. The patient                            tolerated the procedure well. The [id number]                            (  [id number]) Olympus enteroscope was introduced                            through the mouth and advanced to the. Scope In: Scope Out: Findings:      The examined esophagus was normal.      One non-bleeding superficial gastric ulcer with no stigmata of bleeding       was found in the gastric antrum. The lesion was 4 mm in largest       dimension. Biopsies were taken with a cold forceps for histology.       Estimated blood loss was minimal.      The gastric body and pylorus were normal.      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 Coagulation for       hemostasis using argon plasma was successful. One of the AVMs was       actively oozing in the 4th portion of the duodenum. This was treated       with APC and additionally with one hemostatic clip (MR conditional) with       cessation of bleeding. 1 clip failed to place. There was no bleeding at        the end of the procedure.      The mucosa was otherwise normal appearing in the examined small bowel.       Area at the distal extent reached was tattooed with an injection of 2 mL       of Spot (carbon black). This was approximately 140 cm from the pylorus. Impression:               - Normal esophagus.                           - Non-bleeding gastric ulcer with no stigmata of                            bleeding. Biopsied.                           - Normal gastric body and pylorus.                           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 Treated with argon plasma coagulation                            (APC). 1 clip (MR conditional) was placed (1 clip                            failed to place).                           -  Distal extent reached (approximately 140 cm from                            the pylorus) was tattooed for future demarcation. Recommendation:           - Discharge patient to home (with escort).                           - Advance diet as tolerated today.                           - Resume Xarelto (rivaroxaban) at prior dose                            tomorrow.                           - Repeat the small bowel enteroscopy PRN for                            retreatment.                           - Repeat CBC and iron panel in 2 months.                           - Resume oral iron therapy. Procedure Code(s):        --- Professional ---                           [id number], 55, Small intestinal endoscopy, enteroscopy                            beyond second portion of duodenum, not including                            ileum; with control of bleeding (eg, injection,                            bipolar cautery, unipolar cautery, laser, heater                            probe, stapler, plasma coagulator)                           44361, Small intestinal  endoscopy, enteroscopy                            beyond second portion of duodenum, not including                            ileum; with biopsy, single or multiple                           44799, Unlisted procedure, small intestine Diagnosis Code(s):        --- Professional ---  K25.9, Gastric ulcer, unspecified as acute or                            chronic, without hemorrhage or perforation                           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 All rights reserved. The codes documented in this report are preliminary and upon coder review may  be revised to meet current compliance requirements. Gerrit Heck, MD 02/20/2022 8:47:37 AM Number of Addenda: 0

## 2022-02-20 NOTE — Anesthesia Procedure Notes (Signed)
Date/Time: 02/20/2022 7:31 AM Performed by: Cynda Familia, CRNA Pre-anesthesia Checklist: Patient identified, Emergency Drugs available, Suction available, Patient being monitored and Timeout performed Patient Re-evaluated:Patient Re-evaluated prior to induction Oxygen Delivery Method: Simple face mask Placement Confirmation: positive ETCO2 and breath sounds checked- equal and bilateral Dental Injury: Teeth and Oropharynx as per pre-operative assessment  Comments: Bite block by RN--

## 2022-02-20 NOTE — Anesthesia Postprocedure Evaluation (Signed)
Anesthesia Post Note  Patient: [name]  Procedure(s) Performed: BALLOON ENTEROSCOPY ENTEROSCOPY HOT HEMOSTASIS (ARGON PLASMA COAGULATION/BICAP) SUBMUCOSAL TATTOO INJECTION HEMOSTASIS CLIP PLACEMENT BIOPSY     Patient location during evaluation: Endoscopy Anesthesia Type: MAC Level of consciousness: awake and alert Pain management: pain level controlled Vital Signs Assessment: post-procedure vital signs reviewed and stable Respiratory status: spontaneous breathing, nonlabored ventilation, respiratory function stable and patient connected to nasal cannula oxygen Cardiovascular status: blood pressure returned to baseline and stable Postop Assessment: no apparent nausea or vomiting Anesthetic complications: no   No notable events documented.  Last Vitals:  Vitals:   02/20/22 0849 02/20/22 0859  BP: (!) 148/77 (!) 155/81  Pulse: (!) 104 95  Resp: (!) 21 17  Temp:    SpO2: 96% 95%    Last Pain:  Vitals:   02/20/22 0859  TempSrc:   PainSc: 0-No pain                 Felipe Cabell L Dea Bitting
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
## 2022-02-20 NOTE — Discharge Instructions (Signed)
YOU HAD AN ENDOSCOPIC PROCEDURE TODAY: Refer to the procedure report and other information in the discharge instructions given to you for any specific questions about what was found during the examination. If this information does not answer your questions, please call [place] office at [phone] to clarify.  ° °YOU SHOULD EXPECT: Some feelings of bloating in the abdomen. Passage of more gas than usual. Walking can help get rid of the air that was put into your GI tract during the procedure and reduce the bloating. If you had a lower endoscopy (such as a colonoscopy or flexible sigmoidoscopy) you may notice spotting of blood in your stool or on the toilet paper. Some abdominal soreness may be present for a day or two, also. ° °DIET: Your first meal following the procedure should be a light meal and then it is ok to progress to your normal diet. A half-sandwich or bowl of soup is an example of a good first meal. Heavy or fried foods are harder to digest and may make you feel nauseous or bloated. Drink plenty of fluids but you should avoid alcoholic beverages for 24 hours. If you had a esophageal dilation, please see attached instructions for diet.   ° °ACTIVITY: Your care partner should take you home directly after the procedure. You should plan to take it easy, moving slowly for the rest of the day. You can resume normal activity the day after the procedure however YOU SHOULD NOT DRIVE, use power tools, machinery or perform tasks that involve climbing or major physical exertion for 24 hours (because of the sedation medicines used during the test).  ° °SYMPTOMS TO REPORT IMMEDIATELY: °A gastroenterologist can be reached at any hour. Please call [phone]  for any of the following symptoms:  °Following lower endoscopy (colonoscopy, flexible sigmoidoscopy) °Excessive amounts of blood in the stool  °Significant tenderness, worsening of abdominal pains  °Swelling of the abdomen that is new, acute  °Fever of 100° or  higher  °Following upper endoscopy (EGD, EUS, ERCP, esophageal dilation) °Vomiting of blood or coffee ground material  °New, significant abdominal pain  °New, significant chest pain or pain under the shoulder blades  °Painful or persistently difficult swallowing  °New shortness of breath  °Black, tarry-looking or red, bloody stools ° °FOLLOW UP:  °If any biopsies were taken you will be contacted by phone or by letter within the next 1-3 weeks. Call [phone]  if you have not heard about the biopsies in 3 weeks.  °Please also call with any specific questions about appointments or follow up tests. ° °

## 2022-02-21 ENCOUNTER — Encounter (HOSPITAL_COMMUNITY): Payer: Self-pay | Admitting: Gastroenterology

## 2022-02-21 LAB — SURGICAL PATHOLOGY

## 2022-02-25 ENCOUNTER — Ambulatory Visit: Payer: 59

## 2022-02-25 ENCOUNTER — Encounter: Payer: Self-pay | Admitting: Gastroenterology

## 2022-03-06 ENCOUNTER — Ambulatory Visit (INDEPENDENT_AMBULATORY_CARE_PROVIDER_SITE_OTHER): Payer: 59

## 2022-03-06 DIAGNOSIS — R55 Syncope and collapse: Secondary | ICD-10-CM

## 2022-03-07 ENCOUNTER — Ambulatory Visit: Payer: 59 | Admitting: Podiatry

## 2022-03-10 ENCOUNTER — Ambulatory Visit: Payer: 59 | Admitting: Pulmonary Disease

## 2022-03-10 LAB — CUP PACEART REMOTE DEVICE CHECK: Date Time Interrogation Session: 20230311230538

## 2022-03-10 NOTE — Progress Notes (Unsigned)
Subjective:   PATIENT ID: Caitlyn Lawrence GENDER: female DOB: 07/26/1946, MRN: [id number]   HPI  No chief complaint on file.   Reason for Visit: Follow-up COPD  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 Johnney Ou and established with [facility] in 06/2021. She is currently on Combivent, Breo ellipta, albuterol and duonebs. She moved from [place], [place] to [place], [place] in 03/2021 and has worsening COPD. She was previously seen by Pulmonology in her hometown for at least 10 years.  At baseline, she has expiratory wheezing, shortness of breath at rest and exertion and chronic cough throughout the day. She is on Breo daily, combivent one puff twice a day, and albuterol nebulizer three times a day. Her activity is limited due to her symptoms. Her last exacerbation requiring steroids was spring 2021. Has not been hospitalized for her COPD. Symptoms are triggered by activity, pollen, heat. Cold air improves it.   She performs ADLs including showering on her own but she has to pace herself and can be difficult at times. She reports that she has gradually slowed down in activity in the last few years. Her son assists with laundry, cooking, housework. She moved in with her son three months ago in [place].  She is an active smoker and using 1/2-1ppd. She is trying to eliminate. Tried patch for one day and self discontinued.   09/09/21 Since our last visit in July 2022, she was seen in urgent care on 09/02/21 for COPD exacerbation with prednisone and doxycycline. She continues to smoke 1/2 ppd. She was started on Trelegy and feels it has improved her respiratory symptoms. She continues to have shortness of breath with exertion and wheezing. Cough has improved. She is able to perform more activities in  the house including making her bed, laundry and loading dishwasher. Weather worsens her symptoms. She does report a mouth sore appeared one week ago. She states she washes her mouth every time she uses her inhalers. She uses her combivent three times a day out of habit but notices she does fine if she misses a dose. She uses her duonebs maybe 3-4 times a week.  03/10/22 Since our last visit she has been compliant with Trelegy. No COPD-related hospitalizations.***  Social History: Active smoker. 60 pack-years   Past Medical History:  Diagnosis Date   Angiectasia    of colon and duodenum   Blood transfusion without reported diagnosis    CAD (coronary artery disease)    Candidal esophagitis (HCC)    COPD with acute exacerbation ([place])    Diabetes ([place])    Diverticulosis    GI bleed    H. pylori infection 10/2017   Heart failure (HCC)    Hypertension    IDA (iron deficiency anemia)    Internal hemorrhoids    MI (myocardial infarction) ([place])    Osteoarthritis    Pancreatic steatorrhea    Paroxysmal atrial fibrillation (HCC)    TIA (transient ischemic attack) 2020   Tubular adenoma of colon     Allergies  Allergen Reactions   Sulfa Antibiotics Other (See Comments), Hives and Itching    Pt does not like how it makes her feel.  Other reaction(s): Other (See Comments) Pt does not like how it makes her feel.  Penicillins Hives and Itching     Outpatient Medications Prior to Visit  Medication Sig Dispense Refill   acetaminophen (TYLENOL) 500 MG tablet Take 1,000 mg by mouth every 6 (six) hours as needed for mild pain.     albuterol (ACCUNEB) 0.63 MG/3ML nebulizer solution Take 3 mLs (0.63 mg total) by nebulization every 6 (six) hours as needed for wheezing or shortness of breath. 75 mL 3   amLODipine (NORVASC) 2.5 MG tablet Take 2.5 mg by mouth daily.     Blood Glucose Monitoring Suppl (ONETOUCH VERIO) w/Device KIT 1 Units by Does not apply route 3 (three) times daily. 1 kit 0    Clobetasol Propionate 0.05 % shampoo Please apply to scalp daily (Patient taking differently: Apply 1 application topically every other day.) 118 mL 0   COMBIVENT RESPIMAT 20-100 MCG/ACT AERS respimat Inhale 1 puff into the lungs 4 (four) times daily. 4 g 5   empagliflozin (JARDIANCE) 10 MG TABS tablet Take 1 tablet (10 mg total) by mouth daily before breakfast. 90 tablet 0   ferrous sulfate 325 (65 FE) MG tablet Take 1 tablet (325 mg total) by mouth every Monday, Wednesday, and Friday. 36 tablet 0   Fluticasone-Umeclidin-Vilant (TRELEGY ELLIPTA) 100-62.5-25 MCG/ACT AEPB Inhale 1 puff into the lungs daily. 60 each 5   gabapentin (NEURONTIN) 100 MG capsule Take 2 capsules (200 mg total) by mouth 3 (three) times daily. 180 capsule 2   glucose blood (ONETOUCH VERIO) test strip 1 each by Other route 3 (three) times daily. Use as instructed 100 each 12   Insulin Degludec (TRESIBA [place]) Inject 50 Units into the skin at bedtime.     Insulin Pen Needle 32G X 4 MM MISC Inject 1 each into the skin every morning. Use to inject insulin 4 times a day 360 each 3   Insulin Syringe-Needle U-100 31G X 15/64" 0.5 ML MISC Please use with insulin 100 each 3   Lancets MISC 1 Units by Does not apply route in the morning, at noon, and at bedtime. 100 each 2   losartan (COZAAR) 100 MG tablet Take 1 tablet (100 mg total) by mouth daily. 30 tablet 3   lovastatin (MEVACOR) 20 MG tablet Take 20 mg by mouth every evening.     metoprolol succinate (TOPROL-XL) 100 MG 24 hr tablet Take 1 tablet (100 mg total) by mouth daily. Take with or immediately following a meal. 90 tablet 3   Multiple Vitamin (MULTIVITAMIN) capsule Take 1 capsule by mouth daily.     OneTouch Delica Lancets 27N MISC Please check your sugars 4x daily. 100 each 3   polyethylene glycol (MIRALAX / GLYCOLAX) 17 g packet Take 17 g by mouth as needed for mild constipation. (Patient taking differently: Take 17 g by mouth daily as needed for mild constipation.) 30 each 3    rivaroxaban (XARELTO) 20 MG TABS tablet Take 1 tablet (20 mg total) by mouth daily. 90 tablet 2   sodium chloride (OCEAN) 0.65 % SOLN nasal spray Place 1 spray into both nostrils 3 (three) times daily as needed for congestion.     Vitamin D, Ergocalciferol, (DRISDOL) 1.25 MG (50000 UNIT) CAPS capsule Take 50,000 Units by mouth every 7 (seven) days. Mondays     No facility-administered medications prior to visit.    Review of Systems  Constitutional:  Negative for chills, diaphoresis, fever, malaise/fatigue and weight loss.  HENT:  Negative for congestion.   Respiratory:  Positive for cough and shortness of breath. Negative for  hemoptysis, sputum production and wheezing.   Cardiovascular:  Negative for chest pain, palpitations and leg swelling.    Objective:   There were no vitals filed for this visit.      There is no height or weight on file to calculate BMI.  Physical Exam: General: Well-appearing, no acute distress HENT: [place], AT Eyes: EOMI, no scleral icterus Respiratory: Clear to auscultation bilaterally.  No crackles, wheezing or rales Cardiovascular: RRR, -M/R/G, no JVD Extremities:-Edema,-tenderness Neuro: AAO x4, CNII-XII grossly intact Psych: Normal mood, normal affect  Data Reviewed:  Imaging: CT Chest 08/08/1999 (report only) - Mild paraseptal emphysema CT Chest Lung Screen 08/28/21 - Mild centrilobular and paraseptal emphysema with upper lobe predominance. Lingular atelectasis/scarring  PFT: None on file  Labs: CBC    Component Value Date/Time   WBC 6.5 12/31/2021 0934   RBC 5.10 12/31/2021 0934   HGB 14.3 12/31/2021 1023   HGB 10.6 (L) 12/16/2021 1116   HCT 42.0 12/31/2021 1023   HCT 36.8 12/16/2021 1116   PLT 255 12/31/2021 0934   PLT 310 12/16/2021 1116   MCV 78.8 (L) 12/31/2021 0934   MCV 79 12/16/2021 1116   MCH 23.7 (L) 12/31/2021 0934   MCHC 30.1 12/31/2021 0934   RDW 23.6 (H) 12/31/2021 0934   RDW 21.2 (H) 12/16/2021 1116   LYMPHSABS 0.8  12/31/2021 0934   MONOABS 0.5 12/31/2021 0934   EOSABS 0.3 12/31/2021 0934   BASOSABS 0.1 12/31/2021 0934   Absolute eosinophils 04/08/21 - 200     Assessment & Plan:   Discussion: 76 year old female active smoker with COPD, atrial fibrillation on xarelto, 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  Had 2 outpatient COPD exacerbations in 2022.  Currently on ICS/LABA/LAMA. Discussed clinical course and management of COPD including bronchodilator regimen and action plan for exacerbation. Usually needs lower dose of prednisone to treat exacerbations due to hyperglycemia   COPD with emphysema --CONTINUE Trelegy 200-62.5-25 mcg ONE puff ONCE a day. REFILL --CONTINUE Duonebs nebulizer treatment AS NEEDED up to  4 times a day for shortness of breath or wheezing. REFILL --CONTINUE Combivent ONE puff AS NEEDEDfor shortness of breath or wheezing. REFILL --Discuss Pulmonary Rehab  Tobacco abuse Patient is an active smoker. We discussed smoking cessation for 5 minutes. We discussed triggers and stressors and ways to deal with them. We discussed barriers to continued smoking and benefits of smoking cessation. Provided patient with information cessation techniques and interventions including [place] quitline.  Health Maintenance Immunization History  Administered Date(s) Administered   Influenza, High Dose Seasonal PF 10/12/2012, 11/14/2013, 11/20/2014, 11/13/2015, 10/18/2016, 09/09/2017, 09/29/2018, 10/26/2019, 10/18/2020   Influenza-Unspecified 09/10/2021   Moderna Sars-Covid-2 Vaccination 01/19/2020, 03/05/2020, 11/09/2020   PNEUMOCOCCAL CONJUGATE-20 11/18/2021   Pneumococcal Conjugate-13 09/09/2017   Pneumococcal Polysaccharide-23 10/19/2019   Tdap 12/16/2021   CT Lung Screen - 08/2022  No orders of the defined types were placed in this encounter.  No orders of the defined types were placed in this encounter.   No  follow-ups on file.  I have spent a total time of 32-minutes on the day of the appointment reviewing prior documentation, coordinating care and discussing medical diagnosis and plan with the patient/family. Past medical history, allergies, medications were reviewed. Pertinent imaging, labs and tests included in this note have been reviewed and interpreted independently by me.   [place], MD [facility] 03/10/2022 7:45 AM  Office Number [phone]

## 2022-03-19 ENCOUNTER — Telehealth: Payer: Self-pay

## 2022-03-19 NOTE — Telephone Encounter (Signed)
Called pt to schedule therapeutic shoe pick up, no answer. Mailing letter.

## 2022-03-19 NOTE — Progress Notes (Signed)
Carelink Summary Report / Loop Recorder 

## 2022-03-25 ENCOUNTER — Other Ambulatory Visit: Payer: Self-pay | Admitting: Student

## 2022-03-25 DIAGNOSIS — I509 Heart failure, unspecified: Secondary | ICD-10-CM

## 2022-03-25 DIAGNOSIS — Z794 Long term (current) use of insulin: Secondary | ICD-10-CM

## 2022-04-07 ENCOUNTER — Ambulatory Visit (INDEPENDENT_AMBULATORY_CARE_PROVIDER_SITE_OTHER): Payer: 59

## 2022-04-07 DIAGNOSIS — R55 Syncope and collapse: Secondary | ICD-10-CM | POA: Diagnosis not present

## 2022-04-08 LAB — CUP PACEART REMOTE DEVICE CHECK: Date Time Interrogation Session: 20230409230856

## 2022-04-16 ENCOUNTER — Encounter: Payer: Self-pay | Admitting: Pulmonary Disease

## 2022-04-16 ENCOUNTER — Ambulatory Visit (INDEPENDENT_AMBULATORY_CARE_PROVIDER_SITE_OTHER): Payer: 59 | Admitting: Pulmonary Disease

## 2022-04-16 ENCOUNTER — Telehealth: Payer: Self-pay | Admitting: Pulmonary Disease

## 2022-04-16 VITALS — BP 132/72 | HR 80 | Ht 64.0 in | Wt 217.4 lb

## 2022-04-16 DIAGNOSIS — F17210 Nicotine dependence, cigarettes, uncomplicated: Secondary | ICD-10-CM

## 2022-04-16 DIAGNOSIS — J441 Chronic obstructive pulmonary disease with (acute) exacerbation: Secondary | ICD-10-CM

## 2022-04-16 DIAGNOSIS — F17200 Nicotine dependence, unspecified, uncomplicated: Secondary | ICD-10-CM

## 2022-04-16 MED ORDER — PREDNISONE 10 MG PO TABS: 30.0000 mg | ORAL_TABLET | Freq: Every day | ORAL | 0 refills | Status: AC

## 2022-04-16 MED ORDER — IPRATROPIUM BROMIDE 0.02 % IN SOLN: 0.5000 mg | Freq: Two times a day (BID) | RESPIRATORY_TRACT | 2 refills | Status: AC

## 2022-04-16 MED ORDER — COMBIVENT RESPIMAT 20-100 MCG/ACT IN AERS: 1.0000 | INHALATION_SPRAY | Freq: Four times a day (QID) | RESPIRATORY_TRACT | 5 refills | Status: DC | PRN

## 2022-04-16 MED ORDER — TRELEGY ELLIPTA 100-62.5-25 MCG/ACT IN AEPB: 1.0000 | INHALATION_SPRAY | Freq: Every day | RESPIRATORY_TRACT | 5 refills | Status: DC

## 2022-04-16 NOTE — Telephone Encounter (Signed)
disregard

## 2022-04-16 NOTE — Patient Instructions (Signed)
?  COPD with emphysema with exacerbation ?--START prednisone 30 mg daily x 5 days ?--CONTINUE Trelegy 200-62.5-25 mcg ONE puff ONCE a day. REFILL ?--CONTINUE Duonebs nebulizer treatment AS NEEDED up to  4 times a day for shortness of breath or wheezing.  ?--ORDERED Atroven neb for the nebulizer shortage ?--CONTINUE Combivent ONE puff AS NEEDED for shortness of breath or wheezing. REFILL ? ?Tobacco abuse ?Patient is an active smoker. ?We discussed smoking cessation for 5 minutes. We discussed triggers and stressors and ways to deal with them. We discussed barriers to continued smoking and benefits of smoking cessation. Provided patient with information cessation techniques and interventions. ? ?Follow-up with me in 3 months ?

## 2022-04-16 NOTE — Progress Notes (Signed)
? ? ?Subjective:  ? ?PATIENT ID: Caitlyn Lawrence GENDER: female DOB: 01-04-1946, MRN: [id number] ? ? ?HPI ? ?Chief Complaint  ?Patient presents with  ? Follow-up  ?  Pharmacy doesn't have albuterol at this time  ? ? ?Reason for Visit: Follow-up COPD ? ?[name] is a 76 year old female active smoker with COPD, atrial fibrillation, DM2, s/p CABG in 2005 who presents for follow-up ? ?Synopsis: ?She was referred by her PCP Dr. Johnney Ou and established with [facility] in 06/2021. She is currently on Combivent, Breo ellipta, albuterol and duonebs. She moved from [place], [place] to [place], [place] in 03/2021 and has worsening COPD. She was previously seen by Pulmonology in her hometown for at least 10 years. ? ?At baseline, she has expiratory wheezing, shortness of breath at rest and exertion and chronic cough throughout the day. She is on Breo daily, combivent one puff twice a day, and albuterol nebulizer three times a day. Her activity is limited due to her symptoms. Her last exacerbation requiring steroids was spring 2021. Has not been hospitalized for her COPD. Symptoms are triggered by activity, pollen, heat. Cold air improves it.  ? ?She performs ADLs including showering on her own but she has to pace herself and can be difficult at times. She reports that she has gradually slowed down in activity in the last few years. Her son assists with laundry, cooking, housework. She moved in with her son three months ago in [place]. ? ?She is an active smoker and using 1/2-1ppd. She is trying to eliminate. Tried patch for one day and self discontinued.  ? ?09/09/21 ?Since our last visit in July 2022, she was seen in urgent care on 09/02/21 for COPD exacerbation with prednisone and doxycycline. She continues to smoke 1/2 ppd. She was started on Trelegy and feels it has improved her respiratory symptoms. She continues to have shortness of breath with exertion and wheezing. Cough has improved. She is able  to perform more activities in the house including making her bed, laundry and loading dishwasher. Weather worsens her symptoms. She does report a mouth sore appeared one week ago. She states she washes her mouth every time she uses her inhalers. She uses her combivent three times a day out of habit but notices she does fine if she misses a dose. She uses her duonebs maybe 3-4 times a week. ? ?04/16/22 ?Since her last visit she was hospitalized for recurrent GI bleed secondary to AVM in Dec 2022. She is compliant with her Trelegy once a day and her combivent twice a day. Previously used her albuterol neb to four times a day but now once a day due to pharmacy shortage. She notices that she is wheezing more and allergies seem to exacerbate it. Able to perform household duties for herself but no scheduled exercise. She plans to start walking with family this summer. Still smoking 1/2 ppd. Smoking is triggered by habits including when eating breakfast or going to the bathroom. ? ?Social History: ?Active smoker. 60 pack-years ? ? ?Past Medical History:  ?Diagnosis Date  ? Angiectasia   ? of colon and duodenum  ? Blood transfusion without reported diagnosis   ? CAD (coronary artery disease)   ? Candidal esophagitis ([place])   ? COPD with acute exacerbation ([place])   ? Diabetes ([place])   ? Diverticulosis   ? GI bleed   ? H. pylori infection 10/2017  ? Heart failure ([place])   ? Hypertension   ?  IDA (iron deficiency anemia)   ? Internal hemorrhoids   ? MI (myocardial infarction) ([place])   ? Osteoarthritis   ? Pancreatic steatorrhea   ? Paroxysmal atrial fibrillation (HCC)   ? TIA (transient ischemic attack) 2020  ? Tubular adenoma of colon   ?  ?Allergies  ?Allergen Reactions  ? Sulfa Antibiotics Other (See Comments), Hives and Itching  ?  Pt does not like how it makes her feel.  ?Other reaction(s): Other (See Comments) ?Pt does not like how it makes her feel.  ?  ? Penicillins Hives and Itching  ?  ? ?Outpatient Medications Prior to  Visit  ?Medication Sig Dispense Refill  ? acetaminophen (TYLENOL) 500 MG tablet Take 1,000 mg by mouth every 6 (six) hours as needed for mild pain.    ? albuterol (ACCUNEB) 0.63 MG/3ML nebulizer solution Take 3 mLs (0.63 mg total) by nebulization every 6 (six) hours as needed for wheezing or shortness of breath. 75 mL 3  ? amLODipine (NORVASC) 2.5 MG tablet Take 2.5 mg by mouth daily.    ? Blood Glucose Monitoring Suppl (ONETOUCH VERIO) w/Device KIT 1 Units by Does not apply route 3 (three) times daily. 1 kit 0  ? Clobetasol Propionate 0.05 % shampoo Please apply to scalp daily (Patient taking differently: Apply 1 application. topically every other day.) 118 mL 0  ? ferrous sulfate 325 (65 FE) MG tablet Take 1 tablet (325 mg total) by mouth every Monday, Wednesday, and Friday. 36 tablet 0  ? gabapentin (NEURONTIN) 100 MG capsule Take 2 capsules (200 mg total) by mouth 3 (three) times daily. 180 capsule 2  ? glucose blood (ONETOUCH VERIO) test strip 1 each by Other route 3 (three) times daily. Use as instructed 100 each 12  ? Insulin Degludec (TRESIBA [place]) Inject 50 Units into the skin at bedtime.    ? Insulin Pen Needle 32G X 4 MM MISC Inject 1 each into the skin every morning. Use to inject insulin 4 times a day 360 each 3  ? Insulin Syringe-Needle U-100 31G X 15/64" 0.5 ML MISC Please use with insulin 100 each 3  ? JARDIANCE 10 MG TABS tablet TAKE 1 TABLET BY MOUTH ONCE DAILY BEFORE BREAKFAST 90 tablet 2  ? Lancets MISC 1 Units by Does not apply route in the morning, at noon, and at bedtime. 100 each 2  ? losartan (COZAAR) 100 MG tablet Take 1 tablet by mouth once daily 90 tablet 2  ? lovastatin (MEVACOR) 20 MG tablet Take 20 mg by mouth every evening.    ? metoprolol succinate (TOPROL-XL) 100 MG 24 hr tablet TAKE 1 TABLET BY MOUTH ONCE DAILY **TAKE  WITH  OR  IMMEDIATELY  FOLLOWING  A  MEAL** 90 tablet 0  ? Multiple Vitamin (MULTIVITAMIN) capsule Take 1 capsule by mouth daily.    ? OneTouch Delica Lancets 18A MISC  Please check your sugars 4x daily. 100 each 3  ? polyethylene glycol (MIRALAX / GLYCOLAX) 17 g packet Take 17 g by mouth as needed for mild constipation. (Patient taking differently: Take 17 g by mouth daily as needed for mild constipation.) 30 each 3  ? rivaroxaban (XARELTO) 20 MG TABS tablet Take 1 tablet (20 mg total) by mouth daily. 90 tablet 2  ? sodium chloride (OCEAN) 0.65 % SOLN nasal spray Place 1 spray into both nostrils 3 (three) times daily as needed for congestion.    ? Vitamin D, Ergocalciferol, (DRISDOL) 1.25 MG (50000 UNIT) CAPS capsule Take 50,000  Units by mouth every 7 (seven) days. Mondays    ? COMBIVENT RESPIMAT 20-100 MCG/ACT AERS respimat Inhale 1 puff into the lungs 4 (four) times daily. 4 g 5  ? Fluticasone-Umeclidin-Vilant (TRELEGY ELLIPTA) 100-62.5-25 MCG/ACT AEPB Inhale 1 puff into the lungs daily. 60 each 5  ? ?No facility-administered medications prior to visit.  ? ? ?Review of Systems  ?Constitutional:  Negative for chills, diaphoresis, fever, malaise/fatigue and weight loss.  ?HENT:  Negative for congestion.   ?Respiratory:  Positive for wheezing. Negative for cough, hemoptysis, sputum production and shortness of breath.   ?Cardiovascular:  Negative for chest pain, palpitations and leg swelling.  ?Endo/Heme/Allergies:  Positive for environmental allergies.  ? ? ?Objective:  ? ?Vitals:  ? 04/16/22 1148  ?BP: 132/72  ?Pulse: 80  ?SpO2: 95%  ?Weight: 217 lb 6.4 oz (98.6 kg)  ?Height: 5' 4"  (1.626 m)  ? ? ?SpO2: 95 % ?O2 Device: None (Room air) ? ?Body mass index is 37.32 kg/m?. ? ?Physical Exam: ?General: Well-appearing, no acute distress ?HENT: [place], AT ?Eyes: EOMI, no scleral icterus ?Respiratory: Expiratory wheezing bilaterally.  No crackles or rales ?Cardiovascular: RRR, -M/R/G, no JVD ?Extremities:-Edema,-tenderness ?Neuro: AAO x4, CNII-XII grossly intact ?Psych: Normal mood, normal affect ? ?Data Reviewed: ? ?Imaging: ?CT Chest 08/08/1999 (report only) - Mild paraseptal emphysema ?CT  Chest Lung Screen 08/28/21 - Mild centrilobular and paraseptal emphysema with upper lobe predominance. Lingular atelectasis/scarring ? ?PFT: ?None on file ? ?Labs: ?CBC ?   ?Component Value Date/Time  ? W

## 2022-04-17 ENCOUNTER — Ambulatory Visit (HOSPITAL_COMMUNITY)
Admission: RE | Admit: 2022-04-17 | Discharge: 2022-04-17 | Disposition: A | Discharge status: Home / Self Care | Payer: 59 | Source: Ambulatory Visit | Attending: Internal Medicine | Admitting: Internal Medicine

## 2022-04-17 ENCOUNTER — Ambulatory Visit (INDEPENDENT_AMBULATORY_CARE_PROVIDER_SITE_OTHER): Payer: 59 | Admitting: Student

## 2022-04-17 ENCOUNTER — Encounter: Payer: Self-pay | Admitting: Student

## 2022-04-17 VITALS — BP 169/82 | HR 74 | Temp 98.4°F | Wt 218.9 lb

## 2022-04-17 DIAGNOSIS — E119 Type 2 diabetes mellitus without complications: Secondary | ICD-10-CM | POA: Diagnosis not present

## 2022-04-17 DIAGNOSIS — M5136 Other intervertebral disc degeneration, lumbar region: Secondary | ICD-10-CM

## 2022-04-17 DIAGNOSIS — M4316 Spondylolisthesis, lumbar region: Secondary | ICD-10-CM

## 2022-04-17 DIAGNOSIS — I159 Secondary hypertension, unspecified: Secondary | ICD-10-CM

## 2022-04-17 DIAGNOSIS — Z794 Long term (current) use of insulin: Secondary | ICD-10-CM

## 2022-04-17 LAB — POCT GLYCOSYLATED HEMOGLOBIN (HGB A1C): Hemoglobin A1C: 7.1 % — AB (ref 4.0–5.6)

## 2022-04-17 LAB — GLUCOSE, CAPILLARY: Glucose-Capillary: 213 mg/dL — ABNORMAL HIGH (ref 70–99)

## 2022-04-17 MED ORDER — DULOXETINE HCL 30 MG PO CPEP: 30.0000 mg | ORAL_CAPSULE | Freq: Every day | ORAL | 2 refills | Status: DC

## 2022-04-17 MED ORDER — CYCLOBENZAPRINE HCL 5 MG PO TABS: 5.0000 mg | ORAL_TABLET | Freq: Two times a day (BID) | ORAL | 0 refills | Status: AC | PRN

## 2022-04-17 MED ORDER — GABAPENTIN 300 MG PO CAPS: 300.0000 mg | ORAL_CAPSULE | Freq: Three times a day (TID) | ORAL | 2 refills | Status: DC

## 2022-04-17 NOTE — Progress Notes (Signed)
? ?CC: Acute on chronic back pain ? ?HPI: ? ?Caitlyn Lawrence is a 76 y.o. with past medical history CAD, hypertension, A-fib on Xarelto, COPD who presents to follow-up on her blood pressure and chronic back pain. ? ?Please see problem based charting for detail ? ?Past Medical History:  ?Diagnosis Date  ? Angiectasia   ? of colon and duodenum  ? Blood transfusion without reported diagnosis   ? CAD (coronary artery disease)   ? Candidal esophagitis ([place])   ? COPD with acute exacerbation ([place])   ? Diabetes ([place])   ? Diverticulosis   ? GI bleed   ? H. pylori infection 10/2017  ? Heart failure ([place])   ? Hypertension   ? IDA (iron deficiency anemia)   ? Internal hemorrhoids   ? MI (myocardial infarction) ([place])   ? Osteoarthritis   ? Pancreatic steatorrhea   ? Paroxysmal atrial fibrillation ([facility])   ? TIA (transient ischemic attack) 2020  ? Tubular adenoma of colon   ? ?Review of Systems:  per HPI ? ?Physical Exam: ? ?Vitals:  ? 04/17/22 1321 04/17/22 1355  ?BP: (!) 167/81 (!) 169/82  ?Pulse: 88 74  ?Temp: 98.4 ?F (36.9 ?C)   ?TempSrc: Oral   ?SpO2: 99%   ?Weight: 218 lb 14.4 oz (99.3 kg)   ? ?Physical Exam ?Constitutional:   ?   General: She is not in acute distress. ?   Appearance: She is not ill-appearing.  ?HENT:  ?   Head: Normocephalic.  ?Eyes:  ?   General:     ?   Right eye: No discharge.     ?   Left eye: No discharge.  ?   Conjunctiva/sclera: Conjunctivae normal.  ?Cardiovascular:  ?   Rate and Rhythm: Normal rate and regular rhythm.  ?Pulmonary:  ?   Effort: Pulmonary effort is normal. No respiratory distress.  ?   Breath sounds: Normal breath sounds. No wheezing.  ?Abdominal:  ?   General: Bowel sounds are normal.  ?   Palpations: Abdomen is soft.  ?Musculoskeletal:  ?   Comments: Bilateral scars of knee replacement noted. ?Could not perform a full leg raise test due to mobility.  Reported pain of right posterior leg with knee extension. ?Tenderness to palpation in the right paraspinal region.  No noted  midline tenderness.Marland Kitchen  ?Neurological:  ?   General: No focal deficit present.  ?   Mental Status: She is alert.  ?Psychiatric:     ?   Mood and Affect: Mood normal.  ?  ? ?Assessment & Plan:  ? ?See Encounters Tab for problem based charting. ? ?HTN (hypertension) ?Blood pressure elevated today 167/81. ?Patient has only been taking losartan and metoprolol.  She thought her amlodipine was supposed to be stopped. ? ?-Resume amlodipine 2.5 mg ?-Continue losartan and metoprolol. ?-BP recheck in 2 weeks ? ? ?Insulin dependent type 2 diabetes mellitus ([place]) ?A1c 7.1.  She currently injecting 50 units of Tresiba nightly and Jardiance 10 mg daily ?Glucometer summary showed well-controlled fasting blood sugar.  She had a few highs value at night which she admitted to snacking.  No hypoglycemia event recorded. ? ?-Continue Tresiba 50 units p.m. ?-Continue Jardiance 10 mg daily ?-A1c in 3 months ? ?Degenerative disc disease, lumbar ?Patient reports chronic back pain that has been going on for more than 20 years.  Reports recent worsening of her back pain 1 week ago.  Endorses radiating pain to the posterior right leg.  Pain does not  radiate down to the toes.  Endorses pain is burning.  Pain is constant.  Denies any acute neurological symptoms.  Denies acute weakness of right lower extremity.  She denies red flag symptoms. ? ?Physical exam revealed tenderness to palpation in the right paraspinal lower lumbar region.  No noted midline tenderness.  Could not perform a full leg raise test due to mobility.  She reported pain in the right posterior leg with knee extension.  Otherwise normal range of motion of bilateral knees.  Slightly weaker strength on the right lower extremity which she states is not new. ? ?This could either be lumbar nerve impingement vs piriformis syndrome.  For her acute lumbar radiculopathy, we will obtain an x-ray to rule out any bony pathology such as compression fracture.   ? ?I suggest physical therapy but  she declined.  Said that she has had physical therapy in the past.  She is not willing to do physical therapy while she is in pain.  Patient however agree with adding Cymbalta and increase the gabapentin.  I also prescribed a short course of Flexeril for her muscle spasm.  Piriformis syndrome stretching exercise instruction given.  ? ?Patient report prior back injection a few years ago which helped significantly.  We will place a referral to a spine specialist for steroid injection.  ? ?Patient discussed with Dr. Dareen Piano ? ?

## 2022-04-17 NOTE — Assessment & Plan Note (Addendum)
Blood pressure elevated today 167/81. ?Patient has only been taking losartan and metoprolol.  She thought her amlodipine was supposed to be stopped. ? ?-Resume amlodipine 2.5 mg ?-Continue losartan and metoprolol. ?-BP recheck in 2 weeks ? ?

## 2022-04-17 NOTE — Patient Instructions (Signed)
Caitlyn Lawrence, ? ?It was nice seeing you in the clinic today.  Here is a summary what we talked about ? ?1.  Back pain: I ordered an x-ray of your spine to rule out any bony problem.  I increased her gabapentin and added Cymbalta to help with the nerve pain.  I ordered a short course of Flexeril to help relax her muscle in the back.  Please continue Tylenol as needed for pain. ? ?I also placed a referral to see a spine specialist for a steroid injection. ? ?2.  Your blood pressure is high today.  Please resume amlodipine together with losartan and metoprolol. ? ?3.  Your A1c 7.1.  Please continue Jordan. ? ?Please come back in 2 weeks for back pain reassessment and blood pressure recheck. ? ?Take care ? ?Dr. Alfonse Spruce ?

## 2022-04-17 NOTE — Assessment & Plan Note (Addendum)
Patient reports chronic back pain that has been going on for more than 20 years.  Reports recent worsening of her back pain 1 week ago.  Endorses radiating pain to the posterior right leg.  Pain does not radiate down to the toes.  Endorses pain is burning.  Pain is constant.  Denies any acute neurological symptoms.  Denies acute weakness of right lower extremity.  She denies red flag symptoms. ? ?Physical exam revealed tenderness to palpation in the right paraspinal lower lumbar region.  No noted midline tenderness.  Could not perform a full leg raise test due to mobility.  She reported pain in the right posterior leg with knee extension.  Otherwise normal range of motion of bilateral knees.  Slightly weaker strength on the right lower extremity which she states is not new. ? ?This could either be lumbar nerve impingement vs piriformis syndrome.  For her acute lumbar radiculopathy, we will obtain an x-ray to rule out any bony pathology such as compression fracture.   ? ?I suggest physical therapy but she declined.  Said that she has had physical therapy in the past.  She is not willing to do physical therapy while she is in pain.  Patient however agree with adding Cymbalta and increase the gabapentin.  I also prescribed a short course of Flexeril for her muscle spasm.  Piriformis syndrome stretching exercise instruction given.  ? ?Patient report prior back injection a few years ago which helped significantly.  We will place a referral to a spine specialist for steroid injection. ?

## 2022-04-17 NOTE — Assessment & Plan Note (Addendum)
A1c 7.1.  She currently injecting 50 units of Tresiba nightly and Jardiance 10 mg daily ?Glucometer summary showed well-controlled fasting blood sugar.  She had a few highs value at night which she admitted to snacking.  No hypoglycemia event recorded. ? ?-Continue Tresiba 50 units p.m. ?-Continue Jardiance 10 mg daily ?-A1c in 3 months ?

## 2022-04-18 NOTE — Progress Notes (Signed)
Internal Medicine Clinic Attending  Case discussed with Dr. Nguyen  At the time of the visit.  We reviewed the resident's history and exam and pertinent patient test results.  I agree with the assessment, diagnosis, and plan of care documented in the resident's note. 

## 2022-04-21 ENCOUNTER — Telehealth: Payer: Self-pay | Admitting: *Deleted

## 2022-04-21 NOTE — Telephone Encounter (Signed)
Call from [facility] wanted to inform doctor that results of Spinal Xrays are available in the Chart.   ?

## 2022-04-21 NOTE — Telephone Encounter (Signed)
Dr. Alfonse Spruce called the patient yesterday. Thank you!  ?

## 2022-04-22 ENCOUNTER — Telehealth: Payer: Self-pay

## 2022-04-22 NOTE — Progress Notes (Signed)
Carelink Summary Report / Loop Recorder 

## 2022-04-25 ENCOUNTER — Ambulatory Visit (INDEPENDENT_AMBULATORY_CARE_PROVIDER_SITE_OTHER): Payer: 59 | Admitting: Physician Assistant

## 2022-04-25 ENCOUNTER — Encounter: Payer: Self-pay | Admitting: Physician Assistant

## 2022-04-25 VITALS — BP 122/78 | HR 92 | Ht 64.0 in | Wt 217.0 lb

## 2022-04-25 DIAGNOSIS — I10 Essential (primary) hypertension: Secondary | ICD-10-CM | POA: Diagnosis not present

## 2022-04-25 DIAGNOSIS — I480 Paroxysmal atrial fibrillation: Secondary | ICD-10-CM | POA: Diagnosis not present

## 2022-04-25 DIAGNOSIS — E785 Hyperlipidemia, unspecified: Secondary | ICD-10-CM | POA: Diagnosis not present

## 2022-04-25 DIAGNOSIS — I25810 Atherosclerosis of coronary artery bypass graft(s) without angina pectoris: Secondary | ICD-10-CM | POA: Diagnosis not present

## 2022-04-25 DIAGNOSIS — E119 Type 2 diabetes mellitus without complications: Secondary | ICD-10-CM

## 2022-04-25 DIAGNOSIS — Z9889 Other specified postprocedural states: Secondary | ICD-10-CM

## 2022-04-25 DIAGNOSIS — J449 Chronic obstructive pulmonary disease, unspecified: Secondary | ICD-10-CM

## 2022-04-25 MED ORDER — RIVAROXABAN 20 MG PO TABS: 20.0000 mg | ORAL_TABLET | Freq: Every day | ORAL | 3 refills | Status: DC

## 2022-04-25 NOTE — Patient Instructions (Signed)
Medication Instructions:  ?No Medication changes today. The Provider recommends that you stay on your current medications.  ?*If you need a refill on your cardiac medications before your next appointment, please call your pharmacy* ? ? ?Lab Work: ?NONE  ?If you have labs (blood work) drawn today and your tests are completely normal, you will receive your results only by: ?MyChart Message (if you have MyChart) OR ?A paper copy in the mail ?If you have any lab test that is abnormal or we need to change your treatment, we will call you to review the results. ? ? ?Testing/Procedures: ?NONE ? ? ?Follow-Up: ?At [facility], you and your health needs are our priority.  As part of our continuing mission to provide you with exceptional heart care, we have created designated Provider Care Teams.  These Care Teams include your primary Cardiologist (physician) and Advanced Practice Providers (APPs -  Physician Assistants and Nurse Practitioners) who all work together to provide you with the care you need, when you need it. ? ?We recommend signing up for the patient portal called "MyChart".  Sign up information is provided on this After Visit Summary.  MyChart is used to connect with patients for Virtual Visits (Telemedicine).  Patients are able to view lab/test results, encounter notes, upcoming appointments, etc.  Non-urgent messages can be sent to your provider as well.   ?To learn more about what you can do with MyChart, go to [email or web address].   ? ?Your next appointment:   ?6 month(s) ? ?The format for your next appointment:   ?In Person ? ?Provider:   ?Oswaldo Milian, MD  ? ?Important Information About Sugar ? ? ? ? ?  ?

## 2022-04-25 NOTE — Progress Notes (Signed)
?Cardiology Office Note:   ? ?Date:  04/27/2022  ? ?ID:  Caitlyn Lawrence, DOB 17-Jul-1946, MRN [id number] ? ?PCP:  Riesa Pope, MD ?  ?[facility] ?Cardiologist:  Donato Heinz, MD    ? ?Referring MD: Riesa Pope, *  ? ?Chief Complaint  ?Patient presents with  ? Follow-up  ?  Seen for Dr. Gardiner Rhyme  ? ? ?History of Present Illness:   ? ?Caitlyn Lawrence is a 76 y.o. female with a hx of CAD s/p CABG 2005, COPD, HTN, HLD, DM II, and PAF.  Patient was previously followed by Dr. Moshe Cipro of cardiology service in [facility] before moving to [place] in April 2022. Myoview in 2019 showed a small basal lateral area of reversibility, EF 69%.  Patient has a loop recorder placed for syncopal episode after unremarkable event monitor.  Last interrogation in May showed nocturnal pauses, suspected due to sleep apnea.  Echocardiogram obtained on 01/21/2021 showed EF 60 to 65%, normal RV function, no significant valve issue, RVSP 40 to 45 mmHg.  Since her bypass surgery in 2005, she has not had any further intervention.  Patient was last seen by Dr. Gardiner Rhyme on 08/22/2021 at which time it was recommended for the patient to proceed with sleep study.  Her loop recorder is being followed by Dr. Sallyanne Kuster.  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  Xarelto was held during the admission however resumed on discharge.  More recently, patient was seen by [facility] on 03/31/2022 after waking up 2 days in a row with blood in the mouth.  Hemoglobin was normal at 4.7.  Red blood cell count 5.52.  EKG shows sinus rhythm. ? ?Patient presents today for follow-up.  She denies any recent chest pain worsening shortness of breath.  She has not had any further blood in her urine or stool.  She denies any other bleeding issue either.  Overall, she is feeling well.  She walks around with a rolling  walker.  Overall, she is doing well and can follow-up in 6 months with Dr. Nechama Guard. ? ?Past Medical History:  ?Diagnosis Date  ? Angiectasia   ? of colon and duodenum  ? Blood transfusion without reported diagnosis   ? CAD (coronary artery disease)   ? Candidal esophagitis ([place])   ? COPD with acute exacerbation ([place])   ? Diabetes ([place])   ? Diverticulosis   ? GI bleed   ? H. pylori infection 10/2017  ? Heart failure ([place])   ? Hypertension   ? IDA (iron deficiency anemia)   ? Internal hemorrhoids   ? MI (myocardial infarction) ([place])   ? Osteoarthritis   ? Pancreatic steatorrhea   ? Paroxysmal atrial fibrillation ([facility])   ? TIA (transient ischemic attack) 2020  ? Tubular adenoma of colon   ? ? ?Past Surgical History:  ?Procedure Laterality Date  ? BALLOON ENTEROSCOPY N/A 02/20/2022  ? Procedure: BALLOON ENTEROSCOPY;  Surgeon: Lavena Bullion, DO;  Location: [facility] ENDOSCOPY;  Service: Gastroenterology;  Laterality: N/A;  ? BIOPSY  02/20/2022  ? Procedure: BIOPSY;  Surgeon: Lavena Bullion, DO;  Location: [facility];  Service: Gastroenterology;;  ? CARPAL TUNNEL RELEASE    ? CATARACT EXTRACTION Bilateral   ? COLONOSCOPY WITH PROPOFOL N/A 04/10/2021  ? Procedure: COLONOSCOPY WITH PROPOFOL;  Surgeon: Jerene Bears, MD;  Location: [place];  Service: Gastroenterology;  Laterality: N/A;  ?  CORONARY ARTERY BYPASS GRAFT N/A 2005  ? ENTEROSCOPY N/A 02/20/2022  ? Procedure: ENTEROSCOPY;  Surgeon: Lavena Bullion, DO;  Location: [facility];  Service: Gastroenterology;  Laterality: N/A;  ? ESOPHAGOGASTRODUODENOSCOPY (EGD) WITH PROPOFOL N/A 04/10/2021  ? Procedure: ESOPHAGOGASTRODUODENOSCOPY (EGD) WITH PROPOFOL;  Surgeon: Jerene Bears, MD;  Location: [facility] ENDOSCOPY;  Service: Gastroenterology;  Laterality: N/A;  ? HEMOSTASIS CLIP PLACEMENT  02/20/2022  ? Procedure: HEMOSTASIS CLIP PLACEMENT;  Surgeon: Lavena Bullion, DO;  Location: [facility];  Service: Gastroenterology;;  ? HEMOSTASIS CONTROL  04/10/2021  ? Procedure:  HEMOSTASIS CONTROL;  Surgeon: Jerene Bears, MD;  Location: [facility] ENDOSCOPY;  Service: Gastroenterology;;  EPI injection  ? HOT HEMOSTASIS N/A 04/10/2021  ? Procedure: HOT HEMOSTASIS (ARGON PLASMA COAGULATION/BICAP);  Surgeon: Jerene Bears, MD;  Location: [facility] ENDOSCOPY;  Service: Gastroenterology;  Laterality: N/A;  EGD and COLON  ? HOT HEMOSTASIS N/A 02/20/2022  ? Procedure: HOT HEMOSTASIS (ARGON PLASMA COAGULATION/BICAP);  Surgeon: Lavena Bullion, DO;  Location: [facility];  Service: Gastroenterology;  Laterality: N/A;  ? KNEE ARTHROPLASTY Bilateral   ? POLYPECTOMY  04/10/2021  ? Procedure: POLYPECTOMY;  Surgeon: Jerene Bears, MD;  Location: [place];  Service: Gastroenterology;;  ? SUBMUCOSAL TATTOO INJECTION  02/20/2022  ? Procedure: SUBMUCOSAL TATTOO INJECTION;  Surgeon: Lavena Bullion, DO;  Location: [facility] ENDOS[facility];  Service: Gastroenterology;;  ? TOTAL SHOULDER ARTHROPLASTY Right 12/2009  ? TUBAL LIGATION    ? ? ?Current Medications: ?Current Meds  ?Medication Sig  ? acetaminophen (TYLENOL) 500 MG tablet Take 1,000 mg by mouth every 6 (six) hours as needed for mild pain.  ? amLODipine (NORVASC) 2.5 MG tablet Take 2.5 mg by mouth daily.  ? Blood Glucose Monitoring Suppl (ONETOUCH VERIO) w/Device KIT 1 Units by Does not apply route 3 (three) times daily.  ? Clobetasol Propionate 0.05 % shampoo Please apply to scalp daily (Patient taking differently: Apply 1 application. topically every other day.)  ? COMBIVENT RESPIMAT 20-100 MCG/ACT AERS respimat Inhale 1 puff into the lungs every 6 (six) hours as needed for wheezing.  ? ferrous sulfate 325 (65 FE) MG tablet Take 1 tablet (325 mg total) by mouth every Monday, Wednesday, and Friday.  ? Fluticasone-Umeclidin-Vilant (TRELEGY ELLIPTA) 100-62.5-25 MCG/ACT AEPB Inhale 1 puff into the lungs daily.  ? gabapentin (NEURONTIN) 300 MG capsule Take 1 capsule (300 mg total) by mouth 3 (three) times daily.  ? glucose blood (ONETOUCH VERIO) test strip 1 each by Other  route 3 (three) times daily. Use as instructed  ? Insulin Degludec (TRESIBA [place]) Inject 50 Units into the skin at bedtime.  ? Insulin Pen Needle 32G X 4 MM MISC Inject 1 each into the skin every morning. Use to inject insulin 4 times a day  ? Insulin Syringe-Needle U-100 31G X 15/64" 0.5 ML MISC Please use with insulin  ? ipratropium (ATROVENT) 0.02 % nebulizer solution Take 2.5 mLs (0.5 mg total) by nebulization in the morning and at bedtime.  ? JARDIANCE 10 MG TABS tablet TAKE 1 TABLET BY MOUTH ONCE DAILY BEFORE BREAKFAST  ? Lancets MISC 1 Units by Does not apply route in the morning, at noon, and at bedtime.  ? losartan (COZAAR) 100 MG tablet Take 1 tablet by mouth once daily  ? lovastatin (MEVACOR) 20 MG tablet Take 20 mg by mouth every evening.  ? metoprolol succinate (TOPROL-XL) 100 MG 24 hr tablet TAKE 1 TABLET BY MOUTH ONCE DAILY **TAKE  WITH  OR  IMMEDIATELY  FOLLOWING  A  MEAL**  ? Multiple Vitamin (MULTIVITAMIN) capsule Take 1 capsule by mouth daily.  ? OneTouch Delica Lancets 91R MISC Please check your sugars 4x daily.  ? polyethylene glycol (MIRALAX / GLYCOLAX) 17 g packet Take 17 g by mouth as needed for mild constipation. (Patient taking differently: Take 17 g by mouth daily as needed for mild constipation.)  ? sodium chloride (OCEAN) 0.65 % SOLN nasal spray Place 1 spray into both nostrils 3 (three) times daily as needed for congestion.  ? Vitamin D, Ergocalciferol, (DRISDOL) 1.25 MG (50000 UNIT) CAPS capsule Take 50,000 Units by mouth every 7 (seven) days. Mondays  ? [DISCONTINUED] rivaroxaban (XARELTO) 20 MG TABS tablet Take 1 tablet (20 mg total) by mouth daily.  ?  ? ?Allergies:   Sulfa antibiotics and Penicillins  ? ?Social History  ? ?Socioeconomic History  ? Marital status: Single  ?  Spouse name: Not on file  ? Number of children: 5  ? Years of education: Not on file  ? Highest education level: Not on file  ?Occupational History  ? Not on file  ?Tobacco Use  ? Smoking status: Every Day  ?   Packs/day: 1.00  ?  Years: 60.00  ?  Pack years: 60.00  ?  Types: Cigarettes  ? Smokeless tobacco: Former  ? Tobacco comments:  ?  Currently smoking 0.5ppd as of 08/28/21  ?Vaping Use  ? Vaping Use: Never u

## 2022-04-27 ENCOUNTER — Encounter: Payer: Self-pay | Admitting: Physician Assistant

## 2022-05-08 ENCOUNTER — Ambulatory Visit (INDEPENDENT_AMBULATORY_CARE_PROVIDER_SITE_OTHER): Payer: 59

## 2022-05-08 DIAGNOSIS — R55 Syncope and collapse: Secondary | ICD-10-CM | POA: Diagnosis not present

## 2022-05-12 LAB — CUP PACEART REMOTE DEVICE CHECK: Date Time Interrogation Session: 20230512230554

## 2022-05-15 NOTE — Progress Notes (Signed)
Carelink Summary Report / Loop Recorder 

## 2022-06-09 ENCOUNTER — Ambulatory Visit (INDEPENDENT_AMBULATORY_CARE_PROVIDER_SITE_OTHER): Payer: 59

## 2022-06-09 DIAGNOSIS — R55 Syncope and collapse: Secondary | ICD-10-CM | POA: Diagnosis not present

## 2022-06-09 LAB — HM DIABETES EYE EXAM

## 2022-06-12 ENCOUNTER — Other Ambulatory Visit: Payer: Self-pay | Admitting: Neurological Surgery

## 2022-06-12 ENCOUNTER — Other Ambulatory Visit (HOSPITAL_COMMUNITY): Payer: Self-pay | Admitting: Neurological Surgery

## 2022-06-12 DIAGNOSIS — M419 Scoliosis, unspecified: Secondary | ICD-10-CM

## 2022-06-12 LAB — CUP PACEART REMOTE DEVICE CHECK: Date Time Interrogation Session: 20230614230632

## 2022-06-13 ENCOUNTER — Other Ambulatory Visit: Payer: Self-pay | Admitting: Student

## 2022-06-13 DIAGNOSIS — E119 Type 2 diabetes mellitus without complications: Secondary | ICD-10-CM

## 2022-06-18 IMAGING — CT CT CHEST LUNG CANCER SCREENING LOW DOSE W/O CM
1 of 2 series · 15 of 32 positions shown, 19 images · non-contrast
Comparison: None.

CLINICAL DATA: 75-year-old female current smoker, with 45 pack-year
history of smoking, for initial lung cancer screening

EXAM:
CT CHEST WITHOUT CONTRAST LOW-DOSE FOR LUNG CANCER SCREENING
TECHNIQUE: Multidetector CT imaging of the chest was performed following the
standard protocol without IV contrast.

[Series 5: ldct screen-lung · axial · 0.74mm/px · z∈[-310,-24]mm · 15 of 316 slices shown, 19 images]
[im 15/316  mediastinal]
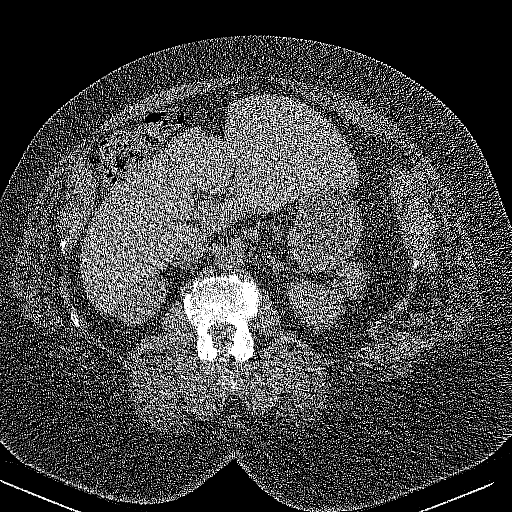
[im 15/316  lung]
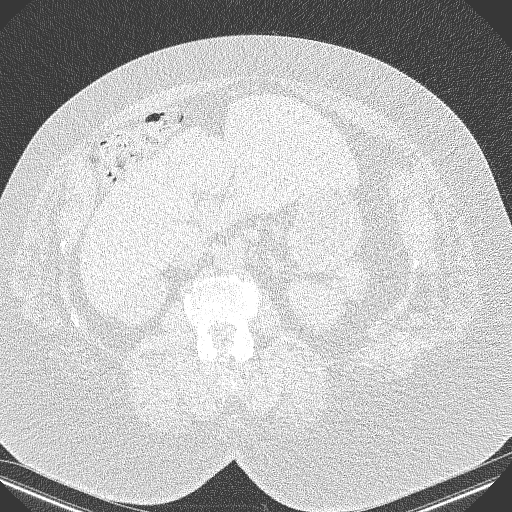
[im 43/316  lung]
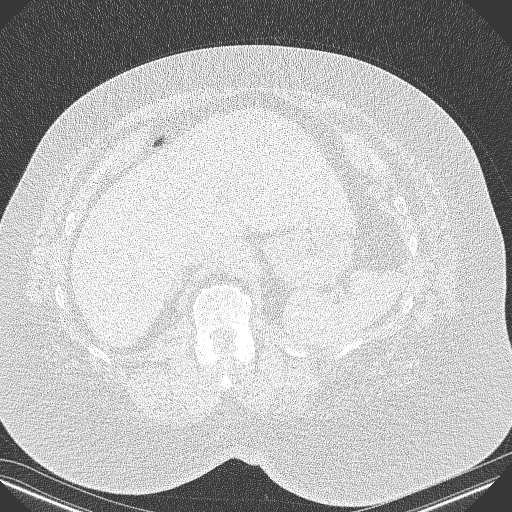
[im 72/316  lung]
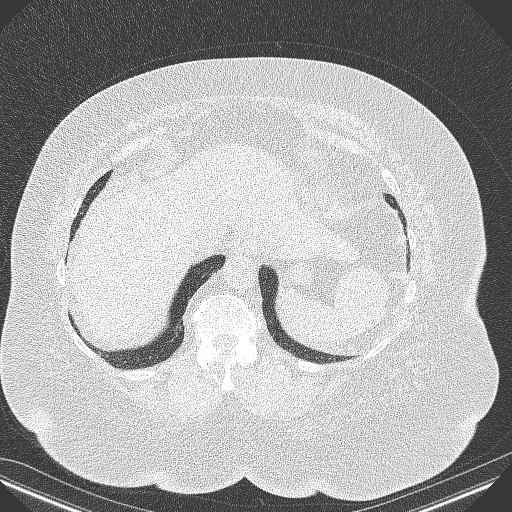
[im 79/316  lung]
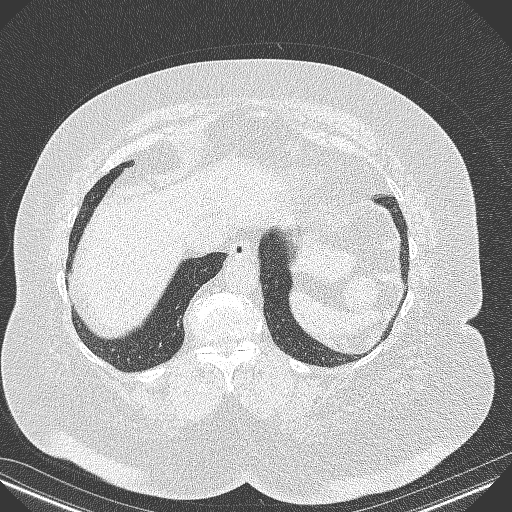
[im 101/316  mediastinal]
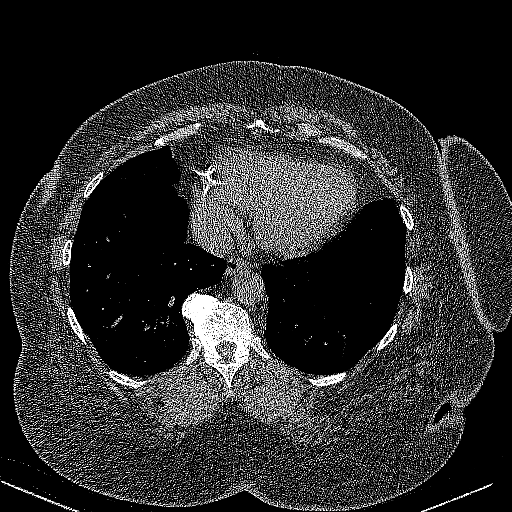
[im 101/316  lung]
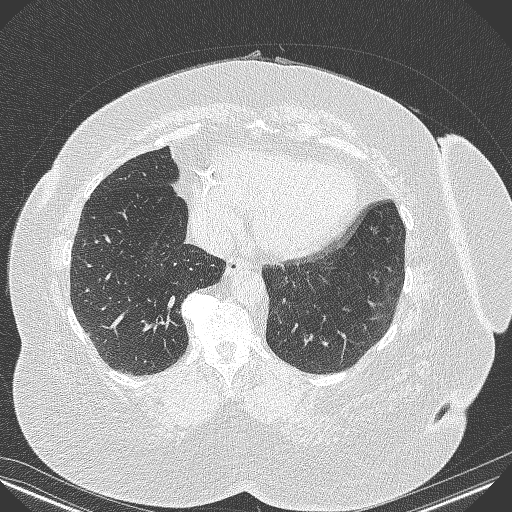
[im 115/316  lung]
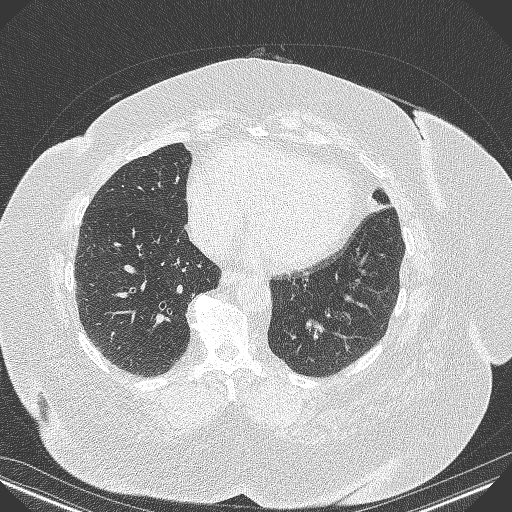
[im 144/316  lung]
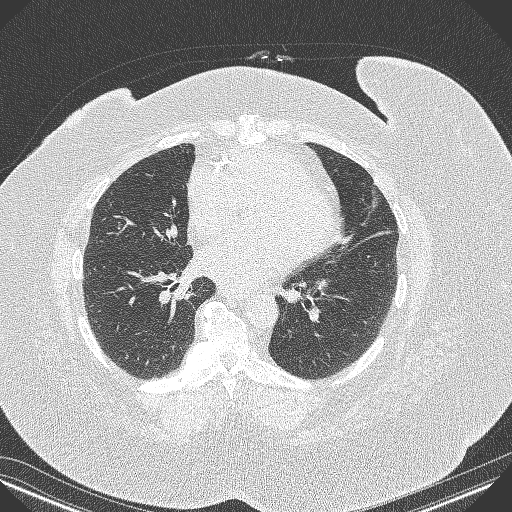
[im 158/316  lung]
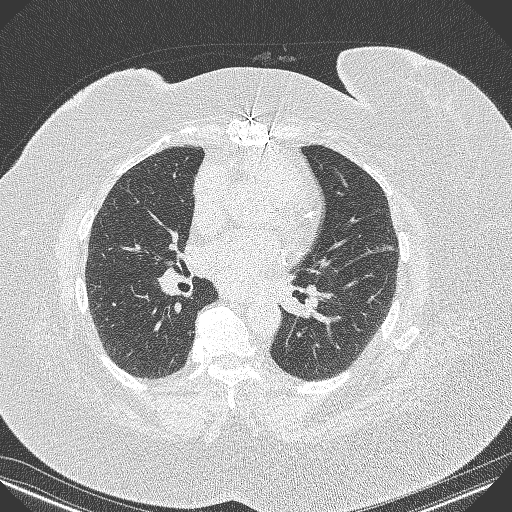
[im 172/316  mediastinal]
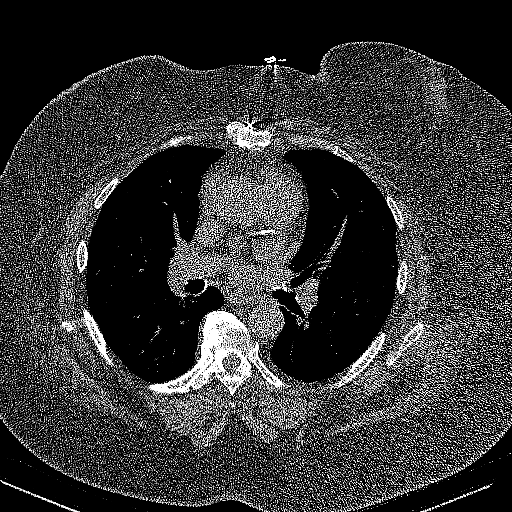
[im 172/316  lung]
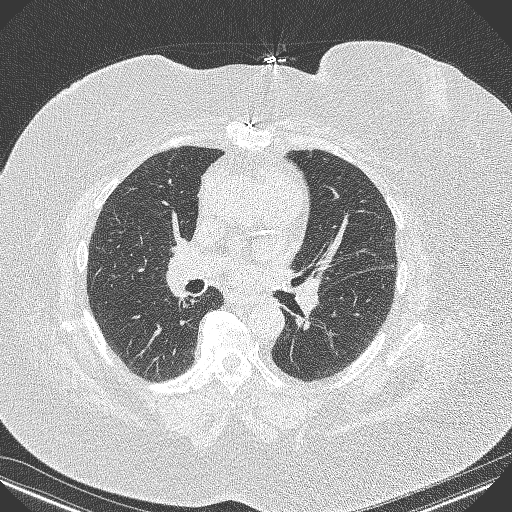
[im 201/316  lung]
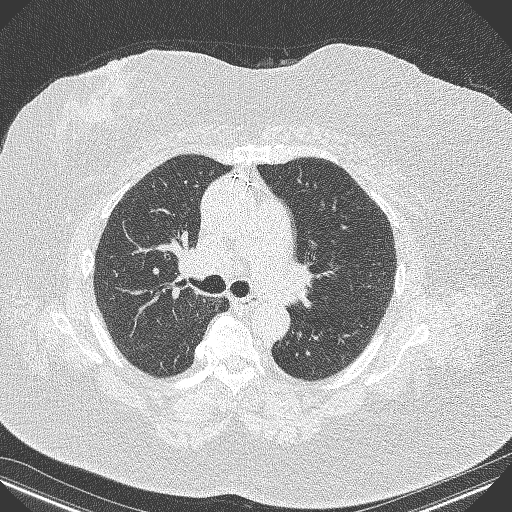
[im 215/316  lung]
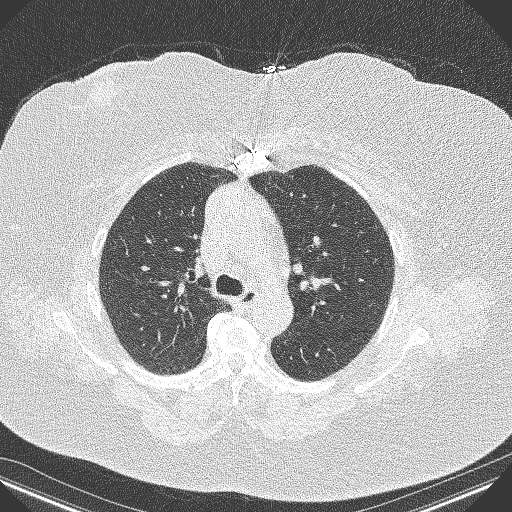
[im 237/316  lung]
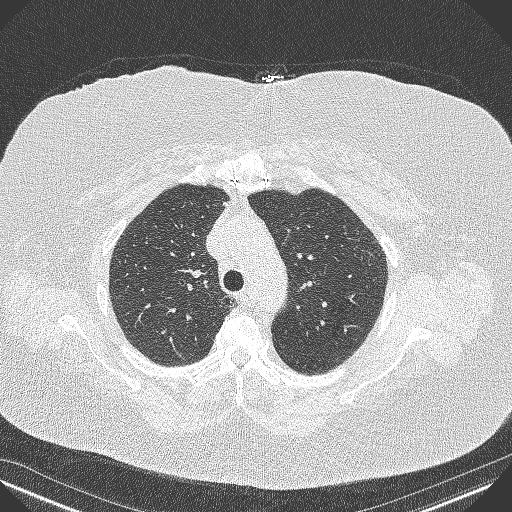
[im 258/316  mediastinal]
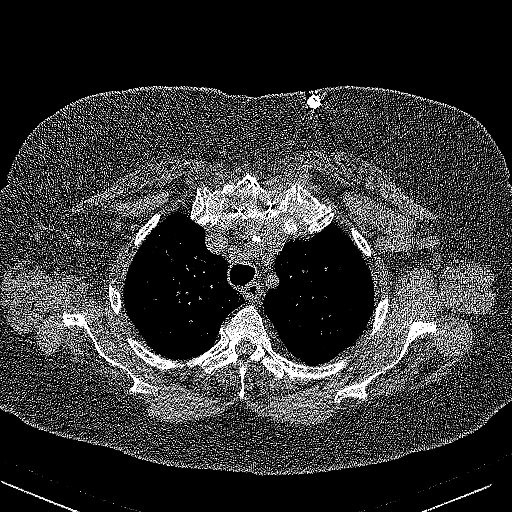
[im 258/316  lung]
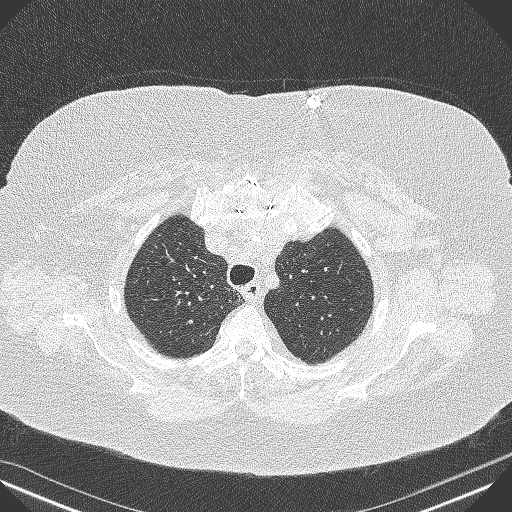
[im 273/316  lung]
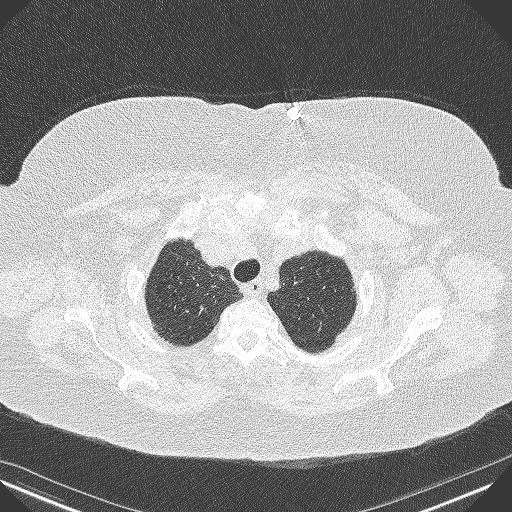
[im 301/316  lung]
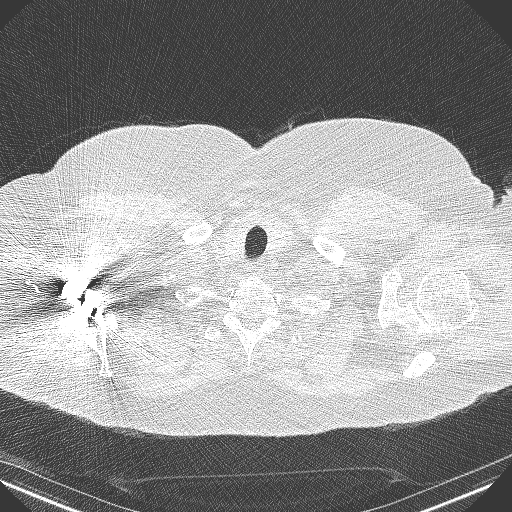

[15 of 32 positions shown; findings below may reference images not displayed]

FINDINGS: Cardiovascular: The heart is normal in size. No pericardial
effusion.

No evidence of thoracic aortic aneurysm. Atherosclerotic
calcifications of the aortic arch.

Three vessel coronary atherosclerosis.

Mediastinum/Nodes: Small mediastinal lymph nodes which do not meet
pathologic CT size criteria.

Visualized thyroid is unremarkable.

Lungs/Pleura: Mild biapical pleural-parenchymal scarring.

Mild centrilobular and paraseptal emphysematous changes, upper lung
predominant.

No focal consolidation.  Lingular scarring/atelectasis.

No suspicious pulmonary nodules.

No pleural effusion or pneumothorax.

Upper Abdomen: Visualized upper abdomen is grossly unremarkable,
noting vascular calcifications.

Musculoskeletal: Degenerative changes of the visualized
thoracolumbar spine. Right shoulder arthroplasty. Median sternotomy.
IMPRESSION: Lung-RADS 1, negative. Continue annual screening with low-dose chest
CT without contrast in 12 months.

Aortic Atherosclerosis (J1URS-ILS.S) and Emphysema (J1URS-T9G.L).

## 2022-06-30 ENCOUNTER — Ambulatory Visit (INDEPENDENT_AMBULATORY_CARE_PROVIDER_SITE_OTHER): Payer: 59 | Admitting: Student

## 2022-06-30 VITALS — BP 146/61 | HR 71 | Temp 98.2°F | Ht 64.0 in | Wt 217.7 lb

## 2022-06-30 DIAGNOSIS — J441 Chronic obstructive pulmonary disease with (acute) exacerbation: Secondary | ICD-10-CM

## 2022-06-30 DIAGNOSIS — R062 Wheezing: Secondary | ICD-10-CM | POA: Diagnosis not present

## 2022-06-30 DIAGNOSIS — E119 Type 2 diabetes mellitus without complications: Secondary | ICD-10-CM | POA: Diagnosis not present

## 2022-06-30 DIAGNOSIS — R059 Cough, unspecified: Secondary | ICD-10-CM

## 2022-06-30 DIAGNOSIS — R093 Abnormal sputum: Secondary | ICD-10-CM | POA: Diagnosis not present

## 2022-06-30 DIAGNOSIS — I10 Essential (primary) hypertension: Secondary | ICD-10-CM

## 2022-06-30 DIAGNOSIS — Z794 Long term (current) use of insulin: Secondary | ICD-10-CM

## 2022-06-30 DIAGNOSIS — I480 Paroxysmal atrial fibrillation: Secondary | ICD-10-CM | POA: Diagnosis not present

## 2022-06-30 DIAGNOSIS — R0989 Other specified symptoms and signs involving the circulatory and respiratory systems: Secondary | ICD-10-CM | POA: Diagnosis not present

## 2022-06-30 DIAGNOSIS — F17210 Nicotine dependence, cigarettes, uncomplicated: Secondary | ICD-10-CM

## 2022-06-30 DIAGNOSIS — I509 Heart failure, unspecified: Secondary | ICD-10-CM

## 2022-06-30 DIAGNOSIS — I159 Secondary hypertension, unspecified: Secondary | ICD-10-CM

## 2022-06-30 DIAGNOSIS — I25810 Atherosclerosis of coronary artery bypass graft(s) without angina pectoris: Secondary | ICD-10-CM

## 2022-06-30 DIAGNOSIS — M5136 Other intervertebral disc degeneration, lumbar region: Secondary | ICD-10-CM

## 2022-06-30 DIAGNOSIS — L281 Prurigo nodularis: Secondary | ICD-10-CM

## 2022-06-30 DIAGNOSIS — Z7984 Long term (current) use of oral hypoglycemic drugs: Secondary | ICD-10-CM

## 2022-06-30 LAB — POCT GLYCOSYLATED HEMOGLOBIN (HGB A1C): Hemoglobin A1C: 6.7 % — AB (ref 4.0–5.6)

## 2022-06-30 LAB — GLUCOSE, CAPILLARY: Glucose-Capillary: 109 mg/dL — ABNORMAL HIGH (ref 70–99)

## 2022-06-30 MED ORDER — LOVASTATIN 20 MG PO TABS: 20.0000 mg | ORAL_TABLET | Freq: Every evening | ORAL | 1 refills | Status: DC

## 2022-06-30 MED ORDER — AMLODIPINE BESYLATE 5 MG PO TABS: 5.0000 mg | ORAL_TABLET | Freq: Every day | ORAL | 0 refills | Status: DC

## 2022-06-30 MED ORDER — AZITHROMYCIN 250 MG PO TABS: ORAL_TABLET | ORAL | 0 refills | Status: DC

## 2022-06-30 MED ORDER — RIVAROXABAN 20 MG PO TABS: 20.0000 mg | ORAL_TABLET | Freq: Every day | ORAL | 3 refills | Status: DC

## 2022-06-30 MED ORDER — LOSARTAN POTASSIUM 100 MG PO TABS: 100.0000 mg | ORAL_TABLET | Freq: Every day | ORAL | 2 refills | Status: DC

## 2022-06-30 MED ORDER — METOPROLOL SUCCINATE ER 100 MG PO TB24: ORAL_TABLET | ORAL | 1 refills | Status: DC

## 2022-06-30 MED ORDER — EMPAGLIFLOZIN 10 MG PO TABS: 10.0000 mg | ORAL_TABLET | Freq: Every day | ORAL | 2 refills | Status: DC

## 2022-06-30 MED ORDER — PREDNISONE 20 MG PO TABS: 40.0000 mg | ORAL_TABLET | Freq: Every day | ORAL | 0 refills | Status: AC

## 2022-06-30 MED ORDER — IPRATROPIUM-ALBUTEROL 0.5-2.5 (3) MG/3ML IN SOLN
3.0000 mL | Freq: Once | RESPIRATORY_TRACT | Status: AC
  Administered 2022-06-30: 3 mL via RESPIRATORY_TRACT

## 2022-06-30 MED ORDER — ALBUTEROL SULFATE 0.63 MG/3ML IN NEBU: 1.0000 | INHALATION_SOLUTION | Freq: Four times a day (QID) | RESPIRATORY_TRACT | 3 refills | Status: AC | PRN

## 2022-06-30 MED ORDER — SARNA 0.5-0.5 % EX LOTN: 1.0000 | TOPICAL_LOTION | CUTANEOUS | 0 refills | Status: DC | PRN

## 2022-06-30 NOTE — Assessment & Plan Note (Signed)
Assessment: Patient with Hx of prurigo nodularis, seen by multiple dermatologists in the past. Most recent note from [facility] who took a skin biopsy. Recommended regimen of fluocinonide (LIDEX) 0.05 % Topical Cream, hydrOXYzine (ATARAX) 10 mg Oral Tablet, camphor-menthol (SARNA) 0.5-0.5 % Topical Lotion. She has not taken these since that time. Will avoid hydroxyzine with patient's age and risk of falling. Will refer to dermatology and write for sarna.    Plan: - Sarna topical lotion.  -Referral to dermatology placed.

## 2022-06-30 NOTE — Patient Instructions (Signed)
Thank you, Caitlyn Lawrence for allowing [place] to provide your care today. Today we discussed .  High Blood Pressure We will increase your amlodipine from 2.5 mg to 5 mg. Please check your blood pressure at home and if you feel lightheaded or dizzy call our clinic.   Diabetes You are doing great! Please continue your current medicines  Increased Cough We will be treating you for a COPD exacerbation. Please take the prednisone for 5 days and azithromycin as well.   Rash I will send you in a cream to help with the flair.     I have ordered the following labs for you:   Lab Orders         Glucose, capillary         POC Hbg A1C       Referrals ordered today:   Referral Orders  No referral(s) requested today     I have ordered the following medication/changed the following medications:   Stop the following medications: Medications Discontinued During This Encounter  Medication Reason   amLODipine (NORVASC) 2.5 MG tablet    lovastatin (MEVACOR) 20 MG tablet Reorder   albuterol (ACCUNEB) 0.63 MG/3ML nebulizer solution Reorder   JARDIANCE 10 MG TABS tablet Reorder   metoprolol succinate (TOPROL-XL) 100 MG 24 hr tablet Reorder   losartan (COZAAR) 100 MG tablet Reorder   rivaroxaban (XARELTO) 20 MG TABS tablet Reorder     Start the following medications: Meds ordered this encounter  Medications   predniSONE (DELTASONE) 20 MG tablet    Sig: Take 2 tablets (40 mg total) by mouth daily with breakfast for 5 days.    Dispense:  10 tablet    Refill:  0   amLODipine (NORVASC) 5 MG tablet    Sig: Take 1 tablet (5 mg total) by mouth daily.    Dispense:  90 tablet    Refill:  0   albuterol (ACCUNEB) 0.63 MG/3ML nebulizer solution    Sig: Take 3 mLs (0.63 mg total) by nebulization every 6 (six) hours as needed for wheezing or shortness of breath.    Dispense:  75 mL    Refill:  3   empagliflozin (JARDIANCE) 10 MG TABS tablet    Sig: Take 1 tablet (10 mg total) by mouth daily  before breakfast.    Dispense:  90 tablet    Refill:  2   losartan (COZAAR) 100 MG tablet    Sig: Take 1 tablet (100 mg total) by mouth daily.    Dispense:  90 tablet    Refill:  2   lovastatin (MEVACOR) 20 MG tablet    Sig: Take 1 tablet (20 mg total) by mouth every evening.    Dispense:  90 tablet    Refill:  1   metoprolol succinate (TOPROL-XL) 100 MG 24 hr tablet    Sig: TAKE 1 TABLET BY MOUTH ONCE DAILY **TAKE  WITH  OR  IMMEDIATELY  FOLLOWING  A  MEAL**    Dispense:  90 tablet    Refill:  1   rivaroxaban (XARELTO) 20 MG TABS tablet    Sig: Take 1 tablet (20 mg total) by mouth daily.    Dispense:  90 tablet    Refill:  3   azithromycin (ZITHROMAX) 250 MG tablet    Sig: Please take 2 pills today and then after take 1 pill for 5 days.    Dispense:  7 tablet    Refill:  0     Follow  up:  1 week    Remember:  Should you have any questions or concerns please call the internal medicine clinic at [phone].    Sanjuana Letters, D.O. [place]

## 2022-06-30 NOTE — Assessment & Plan Note (Signed)
Assessment: Current regimen of amlodipine 2.5 mg, losartan 100 mg daily, metoprolol succinate 100 mg daily. BP of 146/61 today. Prior BP's have also been elevated. Will increase amlodipine to 5 mg daily. Discussed signs/sx of hypotension including dizziness and lightheadedness.  Plan: -Increase amlodipine from 2.5 to 5 mg, continue metoprolol and losartan as per above

## 2022-06-30 NOTE — Assessment & Plan Note (Signed)
Assessment: Current regimen of tresiba 50 units daily and jardiance 10 mg daily. A1c improved from 7.1 to 6.8%. Doing well and without episodes of hypoglycemia. Encouraged to continue exercising as able with COPD and healthy diet. Of note, she is waking up 1-2 times per month with night sweats. Asked when these episodes happen in the future she check her glucose levels.  A1c currently at goal  Plan: -repeat a1c in 3 months -continue jardiance and tresiba as per above

## 2022-06-30 NOTE — Assessment & Plan Note (Signed)
Assessment: Notes overall improvement with trelegy and follows with [name] regularly, next appointment early this month. She does state for the past 5 days she has had worsening wheezing, increased sputum production, and cough. On examination she has diffuse wheezing with decreased airflow. Suspect COPD exacerbation vs acute bronchitis. Given duoneb which improved wheezing and airflow. Will prescribe azithromycin and prednisone for 5 days. Follow up in clinic in 7 days for reassessment. Given strict return precautions.   Plan: -home breathing treatments, prednisone 40 mg for 5 days, azithromycin 500 mg today and 250 thereafter for 5 days.  -follow up in 1 week or return sooner if symptoms worsen.

## 2022-06-30 NOTE — Assessment & Plan Note (Signed)
Continue xarelto

## 2022-06-30 NOTE — Progress Notes (Signed)
CC: Follow up chronic medical conditions - COPD, T2DM  HPI:  [name] is a 76 y.o. female living with a history stated below and presents today for follow up of multiple chronic medical conditions. Please see problem based assessment and plan for additional details.  Past Medical History:  Diagnosis Date   Angiectasia    of colon and duodenum   Blood transfusion without reported diagnosis    CAD (coronary artery disease)    Candidal esophagitis (HCC)    COPD with acute exacerbation ([place])    Diabetes ([place])    Diverticulosis    GI bleed    H. pylori infection 10/2017   Heart failure (HCC)    Hypertension    IDA (iron deficiency anemia)    Internal hemorrhoids    MI (myocardial infarction) ([place])    Osteoarthritis    Pancreatic steatorrhea    Paroxysmal atrial fibrillation (HCC)    TIA (transient ischemic attack) 2020   Tubular adenoma of colon     Current Outpatient Medications on File Prior to Visit  Medication Sig Dispense Refill   acetaminophen (TYLENOL) 500 MG tablet Take 1,000 mg by mouth every 6 (six) hours as needed for mild pain.     Blood Glucose Monitoring Suppl (ONETOUCH VERIO) w/Device KIT 1 Units by Does not apply route 3 (three) times daily. 1 kit 0   Clobetasol Propionate 0.05 % shampoo Please apply to scalp daily (Patient taking differently: Apply 1 application. topically every other day.) 118 mL 0   COMBIVENT RESPIMAT 20-100 MCG/ACT AERS respimat Inhale 1 puff into the lungs every 6 (six) hours as needed for wheezing. 4 g 5   DULoxetine (CYMBALTA) 30 MG capsule Take 1 capsule (30 mg total) by mouth daily. (Patient not taking: Reported on 04/25/2022) 30 capsule 2   ferrous sulfate 325 (65 FE) MG tablet Take 1 tablet (325 mg total) by mouth every Monday, Wednesday, and Friday. 36 tablet 0   Fluticasone-Umeclidin-Vilant (TRELEGY ELLIPTA) 100-62.5-25 MCG/ACT AEPB Inhale 1 puff into the lungs daily. 60 each 5   gabapentin (NEURONTIN) 300 MG capsule Take 1  capsule (300 mg total) by mouth 3 (three) times daily. 90 capsule 2   Insulin Degludec (TRESIBA ) Inject 50 Units into the skin at bedtime.     Insulin Pen Needle 32G X 4 MM MISC Inject 1 each into the skin every morning. Use to inject insulin 4 times a day 360 each 3   Insulin Syringe-Needle U-100 31G X 15/64" 0.5 ML MISC Please use with insulin 100 each 3   ipratropium (ATROVENT) 0.02 % nebulizer solution Take 2.5 mLs (0.5 mg total) by nebulization in the morning and at bedtime. 150 mL 2   Lancets MISC 1 Units by Does not apply route in the morning, at noon, and at bedtime. 100 each 2   Multiple Vitamin (MULTIVITAMIN) capsule Take 1 capsule by mouth daily.     OneTouch Delica Lancets 06Y MISC Please check your sugars 4x daily. 100 each 3   ONETOUCH VERIO test strip USE 1 STRIP TO CHECK GLUCOSE THREE TIMES DAILY AS DIRECTED 100 each 0   polyethylene glycol (MIRALAX / GLYCOLAX) 17 g packet Take 17 g by mouth as needed for mild constipation. (Patient taking differently: Take 17 g by mouth daily as needed for mild constipation.) 30 each 3   sodium chloride (OCEAN) 0.65 % SOLN nasal spray Place 1 spray into both nostrils 3 (three) times daily as needed for congestion.  Vitamin D, Ergocalciferol, (DRISDOL) 1.25 MG (50000 UNIT) CAPS capsule Take 50,000 Units by mouth every 7 (seven) days. Mondays     No current facility-administered medications on file prior to visit.    Family History  Problem Relation Age of Onset   Aneurysm Mother    Uterine cancer Mother    Lung cancer Father    Cancer Father        METS   Diabetes Sister    Hypertension Sister    Alzheimer's disease Brother        Per pt he was beat to death at the nursing home   Uterine cancer Maternal Aunt    Lung cancer Paternal Aunt    Ovarian cancer Daughter    Colon cancer Neg Hx    Esophageal cancer Neg Hx     Social History   Socioeconomic History   Marital status: Single    Spouse name: Not on file   Number of  children: 5   Years of education: Not on file   Highest education level: Not on file  Occupational History   Not on file  Tobacco Use   Smoking status: Every Day    Packs/day: 1.00    Years: 60.00    Total pack years: 60.00    Types: Cigarettes   Smokeless tobacco: Former   Tobacco comments:    Currently smoking 0.5ppd as of 08/28/21  Vaping Use   Vaping Use: Never used  Substance and Sexual Activity   Alcohol use: Not Currently   Drug use: Never   Sexual activity: Not Currently  Other Topics Concern   Not on file  Social History Narrative   Not on file   Social Determinants of Health   Financial Resource Strain: Not on file  Food Insecurity: Not on file  Transportation Needs: Not on file  Physical Activity: Not on file  Stress: Not on file  Social Connections: Not on file  Intimate Partner Violence: Not on file    Review of Systems: ROS negative except for what is noted on the assessment and plan.  Vitals:   06/30/22 0858 06/30/22 1002  BP: (!) 147/64 (!) 146/61  Pulse: 71 71  Temp: 98.2 F (36.8 C)   TempSrc: Oral   SpO2: 100%   Weight: 217 lb 11.2 oz (98.7 kg)   Height: 5' 4"  (1.626 m)     Physical Exam: Constitutional: well appearing, no acute distress HENT: normocephalic atraumatic Eyes: conjunctiva non-erythematous Neck: supple Cardiovascular: regular rate and rhythm, no m/r/g Pulmonary/Chest: normal work of breathing on room air, diffuse wheezing with decreased air flow. No rhonchi or rales present MSK: normal bulk and tone Neurological: alert & oriented x 3 Skin: warm and dry. Multiple scabbed over nodules throughout left lower extremity. None with active drainage Psych: normal mood and thought process  Assessment & Plan:   HTN (hypertension) Assessment: Current regimen of amlodipine 2.5 mg, losartan 100 mg daily, metoprolol succinate 100 mg daily. BP of 146/61 today. Prior BP's have also been elevated. Will increase amlodipine to 5 mg daily.  Discussed signs/sx of hypotension including dizziness and lightheadedness.  Plan: -Increase amlodipine from 2.5 to 5 mg, continue metoprolol and losartan as per above   Paroxysmal A-fib ([place]) Continue xarelto  COPD with acute exacerbation ([place]) Assessment: Notes overall improvement with trelegy and follows with [name] regularly, next appointment early this month. She does state for the past 5 days she has had worsening wheezing, increased sputum production, and cough.  On examination she has diffuse wheezing with decreased airflow. Suspect COPD exacerbation vs acute bronchitis. Given duoneb which improved wheezing and airflow. Will prescribe azithromycin and prednisone for 5 days. Follow up in clinic in 7 days for reassessment. Given strict return precautions.   Plan: -home breathing treatments, prednisone 40 mg for 5 days, azithromycin 500 mg today and 250 thereafter for 5 days.  -follow up in 1 week or return sooner if symptoms worsen.   Insulin dependent type 2 diabetes mellitus (HCC) Assessment: Current regimen of tresiba 50 units daily and jardiance 10 mg daily. A1c improved from 7.1 to 6.8%. Doing well and without episodes of hypoglycemia. Encouraged to continue exercising as able with COPD and healthy diet. Of note, she is waking up 1-2 times per month with night sweats. Asked when these episodes happen in the future she check her glucose levels.  A1c currently at goal  Plan: -repeat a1c in 3 months -continue jardiance and tresiba as per above  Degenerative disc disease, lumbar Following with orthopedics, has spinal MRI scheduled for next week.   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 Most recent note from [facility] who took a skin biopsy. Recommended regimen of fluocinonide (LIDEX) 0.05 % Topical Cream, hydrOXYzine (ATARAX) 10 mg Oral Tablet, camphor-menthol (SARNA) 0.5-0.5 % Topical Lotion. She  has not taken these since that time. Will avoid hydroxyzine with patient's age and risk of falling. Will refer to dermatology and write for sarna.    Plan: - Sarna topical lotion.  -Referral to dermatology placed.   Patient seen with Dr. Newell Coral, D.O. [facility], PGY-2 Pager: [phone], Phone: [phone] Date 06/30/2022 Time 1:18 PM

## 2022-06-30 NOTE — Assessment & Plan Note (Signed)
Following with orthopedics, has spinal MRI scheduled for next week.

## 2022-07-02 ENCOUNTER — Telehealth: Payer: Self-pay | Admitting: Student

## 2022-07-02 NOTE — Telephone Encounter (Signed)
Followed up with patient for suspected COPD vs bronchitis, patient feels breathing has improved, instructed to complete course of prednisone and azithromycin

## 2022-07-02 NOTE — Progress Notes (Signed)
Carelink Summary Report / Loop Recorder 

## 2022-07-02 NOTE — Progress Notes (Signed)
Internal Medicine Clinic Attending  Case discussed with Dr. Katsadouros  At the time of the visit.  We reviewed the resident's history and exam and pertinent patient test results.  I agree with the assessment, diagnosis, and plan of care documented in the resident's note.  

## 2022-07-04 ENCOUNTER — Other Ambulatory Visit: Payer: Self-pay | Admitting: Student

## 2022-07-06 ENCOUNTER — Ambulatory Visit (HOSPITAL_COMMUNITY)
Admission: RE | Admit: 2022-07-06 | Discharge: 2022-07-06 | Disposition: A | Discharge status: Home / Self Care | Payer: 59 | Source: Ambulatory Visit | Attending: Neurological Surgery | Admitting: Neurological Surgery

## 2022-07-06 DIAGNOSIS — M419 Scoliosis, unspecified: Secondary | ICD-10-CM | POA: Diagnosis present

## 2022-07-07 ENCOUNTER — Other Ambulatory Visit: Payer: Self-pay

## 2022-07-07 ENCOUNTER — Encounter: Payer: Self-pay | Admitting: Student

## 2022-07-07 ENCOUNTER — Ambulatory Visit (INDEPENDENT_AMBULATORY_CARE_PROVIDER_SITE_OTHER): Payer: 59 | Admitting: Student

## 2022-07-07 VITALS — BP 128/76 | HR 65 | Temp 97.8°F | Ht 64.0 in | Wt 218.3 lb

## 2022-07-07 DIAGNOSIS — I159 Secondary hypertension, unspecified: Secondary | ICD-10-CM

## 2022-07-07 DIAGNOSIS — J441 Chronic obstructive pulmonary disease with (acute) exacerbation: Secondary | ICD-10-CM

## 2022-07-07 DIAGNOSIS — L281 Prurigo nodularis: Secondary | ICD-10-CM | POA: Diagnosis not present

## 2022-07-07 DIAGNOSIS — I10 Essential (primary) hypertension: Secondary | ICD-10-CM | POA: Diagnosis not present

## 2022-07-07 DIAGNOSIS — M51369 Other intervertebral disc degeneration, lumbar region without mention of lumbar back pain or lower extremity pain: Secondary | ICD-10-CM

## 2022-07-07 DIAGNOSIS — Z720 Tobacco use: Secondary | ICD-10-CM

## 2022-07-07 DIAGNOSIS — M542 Cervicalgia: Secondary | ICD-10-CM | POA: Diagnosis not present

## 2022-07-07 DIAGNOSIS — M5136 Other intervertebral disc degeneration, lumbar region: Secondary | ICD-10-CM

## 2022-07-07 DIAGNOSIS — F17210 Nicotine dependence, cigarettes, uncomplicated: Secondary | ICD-10-CM

## 2022-07-07 DIAGNOSIS — E119 Type 2 diabetes mellitus without complications: Secondary | ICD-10-CM

## 2022-07-07 DIAGNOSIS — Z794 Long term (current) use of insulin: Secondary | ICD-10-CM

## 2022-07-07 MED ORDER — SARNA 0.5-0.5 % EX LOTN: 1.0000 | TOPICAL_LOTION | CUTANEOUS | 0 refills | Status: AC | PRN

## 2022-07-07 MED ORDER — NICOTINE POLACRILEX 2 MG MT GUM: 2.0000 mg | CHEWING_GUM | OROMUCOSAL | 0 refills | Status: AC | PRN

## 2022-07-07 MED ORDER — DICLOFENAC SODIUM 1 % EX GEL: 2.0000 g | Freq: Four times a day (QID) | CUTANEOUS | 0 refills | Status: DC

## 2022-07-07 MED ORDER — ONETOUCH DELICA LANCETS 30G MISC: 3 refills | Status: AC

## 2022-07-07 NOTE — Assessment & Plan Note (Signed)
Assessment: Patient unable to pick up Sarna lotion at prior visit.  Rash is still present and has not worsened.  Discussed with her we placed a referral to dermatology and to be on the look out for a phone call from them.  Plan: -Place new prescription for Sarna lotion -Follow-up dermatology

## 2022-07-07 NOTE — Assessment & Plan Note (Signed)
Assessment: Patient with history of periodic neck pain.  She performs stretches at times that help as well as conservative management with a heating pad.  On examination she is tender to palpate over her trapezius muscle.  No masses or skin deformities noted.  Discussed continuing conservative management with Tylenol, heating pad and will write prescription for Voltaren gel.  Also given stretches and handout today.  Plan: -Conservative management and follow-up for improvement

## 2022-07-07 NOTE — Assessment & Plan Note (Deleted)
Assessment: Patient evaluated last week and found to have COPD exacerbation.  She has 1 more day of her prednisone therapy and completed her azithromycin course.  She feels much better.  On my exam she has improvement of her wheezing.  We will continue management with her Trelegy and she has follow-up with her pulmonologist next week.  With her COPD she feels as though she is unable to ambulate long distances without having to stop and rest.  She is requesting a rollator, I believe this is appropriate to order.  We will place DME order.  Plan: -Continue Trelegy, follow-up with pulmonology next week -DME rollator order placed

## 2022-07-07 NOTE — Assessment & Plan Note (Signed)
Assessment: We discussed her tobacco use disorder.  She continues to smoke cigarettes but has decreased and is currently.  We discussed stopping her evening cigarette and slowly cutting down 1 at a time.  She has increased life stressors, her sister was recently placed on hospice and she is going to visit her for the next few months.  We will start Nicorette gum.  In the past she has tried nicotine patches however that made her break out in a rash.  She would like to hold off on starting medication such as Chantix.  Would avoid Wellbutrin as she has been on 2 other centrally acting medications  Plan: -Continue smoking cessation -Prescription written for nicotine gum

## 2022-07-07 NOTE — Assessment & Plan Note (Signed)
Assessment: Current regimen of amlodipine 5 mg, losartan 100 mg and metoprolol succinate 100 mg.  Her last visit her amlodipine was increased from 2.5 to 5 mg daily.  Denies symptoms of lightheadedness or shortness of breath or dizziness.  Blood pressure today 128/76.  We will continue regimen and if persistent elevation consider addition of HCTZ  Plan: -Continue amlodipine, losartan, metoprolol succinate as per above

## 2022-07-07 NOTE — Assessment & Plan Note (Signed)
Assessment: Patient evaluated last week and found to have COPD exacerbation.  She has 1 more day of her prednisone therapy and completed her azithromycin course.  She feels much better.  On my exam she has improvement of her wheezing.  We will continue management with her Trelegy and she has follow-up with her pulmonologist next week.  With her COPD she feels as though she is unable to ambulate long distances without having to stop and rest.  She is requesting a rollator, I believe this is appropriate to order.  We will place DME order.  Plan: -Continue Trelegy, follow-up with pulmonology next week -DME rollator order placed

## 2022-07-07 NOTE — Patient Instructions (Addendum)
Thank you, Caitlyn Lawrence for allowing [place] to provide your care today. Today we discussed.  COPD Exacerbation I am glad your breathing is better! Please continue your inhalers and follow up with [name].   Rash Please check with the pharmacy for your SARNA lotion I prescribed.   Smoking Please continue to work on decreasing your cigarette use. I have prescribed nicotine gum. If this doesn't help there are other medications we can add to help with smoking  Neck Soreness Please do neck exercises attached and use heating pad and voltaren gel as needed for continue neck pain. I believe this is from a muscle injury.    I have ordered the following labs for you:  Lab Orders  No laboratory test(s) ordered today     Referrals ordered today:   Referral Orders  No referral(s) requested today     I have ordered the following medication/changed the following medications:   Stop the following medications: Medications Discontinued During This Encounter  Medication Reason   camphor-menthol (SARNA) lotion Reorder     Start the following medications: Meds ordered this encounter  Medications   camphor-menthol (SARNA) lotion    Sig: Apply 1 Application topically as needed for itching.    Dispense:  222 mL    Refill:  0   diclofenac Sodium (VOLTAREN) 1 % GEL    Sig: Apply 2 g topically 4 (four) times daily.    Dispense:  50 g    Refill:  0   nicotine polacrilex (NICORETTE) 2 MG gum    Sig: Take 1 each (2 mg total) by mouth as needed for smoking cessation.    Dispense:  100 tablet    Refill:  0     Follow up: 2-3 months for diabetes management   Should you have any questions or concerns please call the internal medicine clinic at [phone].    Sanjuana Letters, D.O. [place]  Neck Exercises Ask your health care provider which exercises are safe for you. Do exercises exactly as told by your health care provider and adjust them as  directed. It is normal to feel mild stretching, pulling, tightness, or discomfort as you do these exercises. Stop right away if you feel sudden pain or your pain gets worse. Do not begin these exercises until told by your health care provider. Neck exercises can be important for many reasons. They can improve strength and maintain flexibility in your neck, which will help your upper back and prevent neck pain. Stretching exercises Rotation neck stretching  Sit in a chair or stand up. Place your feet flat on the floor, shoulder-width apart. Slowly turn your head (rotate) to the right until a slight stretch is felt. Turn it all the way to the right so you can look over your right shoulder. Do not tilt or tip your head. Hold this position for 10-30 seconds. Slowly turn your head (rotate) to the left until a slight stretch is felt. Turn it all the way to the left so you can look over your left shoulder. Do not tilt or tip your head. Hold this position for 10-30 seconds. Repeat __________ times. Complete this exercise __________ times a day. Neck retraction  Sit in a sturdy chair or stand up. Look straight ahead. Do not bend your neck. Use your fingers to push your chin backward (retraction). Do not bend your neck for this movement. Continue to face straight ahead. If you are doing the exercise properly, you  will feel a slight sensation in your throat and a stretch at the back of your neck. Hold the stretch for 1-2 seconds. Repeat __________ times. Complete this exercise __________ times a day. Strengthening exercises Neck press  Lie on your back on a firm bed or on the floor with a pillow under your head. Use your neck muscles to push your head down on the pillow and straighten your spine. Hold the position as well as you can. Keep your head facing up (in a neutral position) and your chin tucked. Slowly count to 5 while holding this position. Repeat __________ times. Complete this exercise  __________ times a day. Isometrics These are exercises in which you strengthen the muscles in your neck while keeping your neck still (isometrics). Sit in a supportive chair and place your hand on your forehead. Keep your head and face facing straight ahead. Do not flex or extend your neck while doing isometrics. Push forward with your head and neck while pushing back with your hand. Hold for 10 seconds. Do the sequence again, this time putting your hand against the back of your head. Use your head and neck to push backward against the hand pressure. Finally, do the same exercise on either side of your head, pushing sideways against the pressure of your hand. Repeat __________ times. Complete this exercise __________ times a day. Prone head lifts  Lie face-down (prone position), resting on your elbows so that your chest and upper back are raised. Start with your head facing downward, near your chest. Position your chin either on or near your chest. Slowly lift your head upward. Lift until you are looking straight ahead. Then continue lifting your head as far back as you can comfortably stretch. Hold your head up for 5 seconds. Then slowly lower it to your starting position. Repeat __________ times. Complete this exercise __________ times a day. Supine head lifts  Lie on your back (supine position), bending your knees to point to the ceiling and keeping your feet flat on the floor. Lift your head slowly off the floor, raising your chin toward your chest. Hold for 5 seconds. Repeat __________ times. Complete this exercise __________ times a day. Scapular retraction  Stand with your arms at your sides. Look straight ahead. Slowly pull both shoulders (scapulae) backward and downward (retraction) until you feel a stretch between your shoulder blades in your upper back. Hold for 10-30 seconds. Relax and repeat. Repeat __________ times. Complete this exercise __________ times a day. Contact a  health care provider if: Your neck pain or discomfort gets worse when you do an exercise. Your neck pain or discomfort does not improve within 2 hours after you exercise. If you have any of these problems, stop exercising right away. Do not do the exercises again unless your health care provider says that you can. Get help right away if: You develop sudden, severe neck pain. If this happens, stop exercising right away. Do not do the exercises again unless your health care provider says that you can. This information is not intended to replace advice given to you by your health care provider. Make sure you discuss any questions you have with your health care provider. Document Revised: 06/11/2021 Document Reviewed: 06/11/2021 Elsevier Patient Education  [place].

## 2022-07-07 NOTE — Progress Notes (Signed)
CC: Follow up COPD Exacerbation and Tobacco Use Disorder  HPI:  [name] is a 76 y.o. female living with a history stated below and presents today for follow up after being diagnosed with COPD exacerbation last week. Please see problem based assessment and plan for additional details.  Past Medical History:  Diagnosis Date   Angiectasia    of colon and duodenum   Blood transfusion without reported diagnosis    CAD (coronary artery disease)    Candidal esophagitis (HCC)    COPD with acute exacerbation ([place])    Diabetes ([place])    Diverticulosis    GI bleed    H. pylori infection 10/2017   Heart failure (HCC)    Hypertension    IDA (iron deficiency anemia)    Internal hemorrhoids    MI (myocardial infarction) ([place])    Osteoarthritis    Pancreatic steatorrhea    Paroxysmal atrial fibrillation (HCC)    TIA (transient ischemic attack) 2020   Tubular adenoma of colon     Current Outpatient Medications on File Prior to Visit  Medication Sig Dispense Refill   acetaminophen (TYLENOL) 500 MG tablet Take 1,000 mg by mouth every 6 (six) hours as needed for mild pain.     albuterol (ACCUNEB) 0.63 MG/3ML nebulizer solution Take 3 mLs (0.63 mg total) by nebulization every 6 (six) hours as needed for wheezing or shortness of breath. 75 mL 3   amLODipine (NORVASC) 5 MG tablet Take 1 tablet (5 mg total) by mouth daily. 90 tablet 0   azithromycin (ZITHROMAX) 250 MG tablet Please take 2 pills today and then after take 1 pill for 5 days. 7 tablet 0   Blood Glucose Monitoring Suppl (ONETOUCH VERIO) w/Device KIT 1 Units by Does not apply route 3 (three) times daily. 1 kit 0   Clobetasol Propionate 0.05 % shampoo Please apply to scalp daily (Patient taking differently: Apply 1 application. topically every other day.) 118 mL 0   COMBIVENT RESPIMAT 20-100 MCG/ACT AERS respimat Inhale 1 puff into the lungs every 6 (six) hours as needed for wheezing. 4 g 5   DULoxetine (CYMBALTA) 30 MG  capsule Take 1 capsule (30 mg total) by mouth daily. (Patient not taking: Reported on 04/25/2022) 30 capsule 2   empagliflozin (JARDIANCE) 10 MG TABS tablet Take 1 tablet (10 mg total) by mouth daily before breakfast. 90 tablet 2   ferrous sulfate 325 (65 FE) MG tablet Take 1 tablet (325 mg total) by mouth every Monday, Wednesday, and Friday. 36 tablet 0   Fluticasone-Umeclidin-Vilant (TRELEGY ELLIPTA) 100-62.5-25 MCG/ACT AEPB Inhale 1 puff into the lungs daily. 60 each 5   gabapentin (NEURONTIN) 300 MG capsule Take 1 capsule (300 mg total) by mouth 3 (three) times daily. 90 capsule 2   Insulin Degludec (TRESIBA [place]) Inject 50 Units into the skin at bedtime.     Insulin Pen Needle 32G X 4 MM MISC Inject 1 each into the skin every morning. Use to inject insulin 4 times a day 360 each 3   Insulin Syringe-Needle U-100 31G X 15/64" 0.5 ML MISC Please use with insulin 100 each 3   ipratropium (ATROVENT) 0.02 % nebulizer solution Take 2.5 mLs (0.5 mg total) by nebulization in the morning and at bedtime. 150 mL 2   Lancets MISC 1 Units by Does not apply route in the morning, at noon, and at bedtime. 100 each 2   losartan (COZAAR) 100 MG tablet Take 1 tablet (100 mg total) by  mouth daily. 90 tablet 2   lovastatin (MEVACOR) 20 MG tablet Take 1 tablet (20 mg total) by mouth every evening. 90 tablet 1   metoprolol succinate (TOPROL-XL) 100 MG 24 hr tablet TAKE 1 TABLET BY MOUTH ONCE DAILY **TAKE  WITH  OR  IMMEDIATELY  FOLLOWING  A  MEAL** 90 tablet 1   Multiple Vitamin (MULTIVITAMIN) capsule Take 1 capsule by mouth daily.     ONETOUCH VERIO test strip USE 1 STRIP TO CHECK GLUCOSE THREE TIMES DAILY AS DIRECTED 100 each 0   polyethylene glycol (MIRALAX / GLYCOLAX) 17 g packet Take 17 g by mouth as needed for mild constipation. (Patient taking differently: Take 17 g by mouth daily as needed for mild constipation.) 30 each 3   rivaroxaban (XARELTO) 20 MG TABS tablet Take 1 tablet (20 mg total) by mouth daily. 90  tablet 3   sodium chloride (OCEAN) 0.65 % SOLN nasal spray Place 1 spray into both nostrils 3 (three) times daily as needed for congestion.     Vitamin D, Ergocalciferol, (DRISDOL) 1.25 MG (50000 UNIT) CAPS capsule Take 50,000 Units by mouth every 7 (seven) days. Mondays     No current facility-administered medications on file prior to visit.    Family History  Problem Relation Age of Onset   Aneurysm Mother    Uterine cancer Mother    Lung cancer Father    Cancer Father        METS   Diabetes Sister    Hypertension Sister    Alzheimer's disease Brother        Per pt he was beat to death at the nursing home   Uterine cancer Maternal Aunt    Lung cancer Paternal Aunt    Ovarian cancer Daughter    Colon cancer Neg Hx    Esophageal cancer Neg Hx     Social History   Socioeconomic History   Marital status: Single    Spouse name: Not on file   Number of children: 5   Years of education: Not on file   Highest education level: Not on file  Occupational History   Not on file  Tobacco Use   Smoking status: Every Day    Packs/day: 1.00    Years: 60.00    Total pack years: 60.00    Types: Cigarettes   Smokeless tobacco: Former   Tobacco comments:    Currently smoking 0.5ppd as of 08/28/21  Vaping Use   Vaping Use: Never used  Substance and Sexual Activity   Alcohol use: Not Currently   Drug use: Never   Sexual activity: Not Currently  Other Topics Concern   Not on file  Social History Narrative   Not on file   Social Determinants of Health   Financial Resource Strain: Not on file  Food Insecurity: Not on file  Transportation Needs: Not on file  Physical Activity: Not on file  Stress: Not on file  Social Connections: Not on file  Intimate Partner Violence: Not on file    Review of Systems: ROS negative except for what is noted on the assessment and plan.  Vitals:   07/07/22 1016 07/07/22 1040  BP: (!) 148/83 128/76  Pulse: 74 65  Temp: 97.8 F (36.6 C)    TempSrc: Oral   SpO2: 97%   Weight: 218 lb 4.8 oz (99 kg)   Height: _0  (1.626 m)     Physical Exam: Constitutional: well-appearing, in no acute distress HENT: normocephalic atraumatic, mucous  membranes moist Eyes: conjunctiva non-erythematous Neck: supple Cardiovascular: regular rate and rhythm, no m/r/g Pulmonary/Chest: normal work of breathing on room air, no wheezing or rales.  MSK: normal bulk and tone Neurological: alert & oriented x 3 Skin: warm and dry. Multiple scabbed over nodules throughout left lower extremity. None with active drainage Psych: normal mood and thought process  Assessment & Plan:   COPD with acute exacerbation ([place]) Assessment: Patient evaluated last week and found to have COPD exacerbation.  She has 1 more day of her prednisone therapy and completed her azithromycin course.  She feels much better.  On my exam she has improvement of her wheezing.  We will continue management with her Trelegy and she has follow-up with her pulmonologist next week.  With her COPD she feels as though she is unable to ambulate long distances without having to stop and rest.  She is requesting a rollator, I believe this is appropriate to order.  We will place DME order.  Plan: -Continue Trelegy, follow-up with pulmonology next week -DME rollator order placed   HTN (hypertension) Assessment: Current regimen of amlodipine 5 mg, losartan 100 mg and metoprolol succinate 100 mg.  Her last visit her amlodipine was increased from 2.5 to 5 mg daily.  Denies symptoms of lightheadedness or shortness of breath or dizziness.  Blood pressure today 128/76.  We will continue regimen and if persistent elevation consider addition of HCTZ  Plan: -Continue amlodipine, losartan, metoprolol succinate as per above  Prurigo nodularis Assessment: Patient unable to pick up Sarna lotion at prior visit.  Rash is still present and has not worsened.  Discussed with her we placed a referral to  dermatology and to be on the look out for a phone call from them.  Plan: -Place new prescription for Sarna lotion -Follow-up dermatology  Tobacco abuse Assessment: We discussed her tobacco use disorder.  She continues to smoke cigarettes but has decreased and is currently.  We discussed stopping her evening cigarette and slowly cutting down 1 at a time.  She has increased life stressors, her sister was recently placed on hospice and she is going to visit her for the next few months.  We will start Nicorette gum.  In the past she has tried nicotine patches however that made her break out in a rash.  She would like to hold off on starting medication such as Chantix.  Would avoid Wellbutrin as she has been on 2 other centrally acting medications  Plan: -Continue smoking cessation -Prescription written for nicotine gum  Neck pain Assessment: Patient with history of periodic neck pain.  She performs stretches at times that help as well as conservative management with a heating pad.  On examination she is tender to palpate over her trapezius muscle.  No masses or skin deformities noted.  Discussed continuing conservative management with Tylenol, heating pad and will write prescription for Voltaren gel.  Also given stretches and handout today.  Plan: -Conservative management and follow-up for improvement  Patient discussed with Dr. Caffie Damme, D.O. [facility], PGY-3 Phone: [phone] Date 07/07/2022 Time 8:22 PM

## 2022-07-09 ENCOUNTER — Other Ambulatory Visit: Payer: Self-pay | Admitting: Student

## 2022-07-09 DIAGNOSIS — E119 Type 2 diabetes mellitus without complications: Secondary | ICD-10-CM

## 2022-07-09 NOTE — Progress Notes (Signed)
Internal Medicine Clinic Attending  Case discussed with Dr. Katsadouros  At the time of the visit.  We reviewed the resident's history and exam and pertinent patient test results.  I agree with the assessment, diagnosis, and plan of care documented in the resident's note.  

## 2022-07-10 ENCOUNTER — Ambulatory Visit (INDEPENDENT_AMBULATORY_CARE_PROVIDER_SITE_OTHER): Payer: 59

## 2022-07-10 DIAGNOSIS — R55 Syncope and collapse: Secondary | ICD-10-CM

## 2022-07-15 LAB — CUP PACEART REMOTE DEVICE CHECK: Date Time Interrogation Session: 20230717230842

## 2022-07-16 ENCOUNTER — Ambulatory Visit (INDEPENDENT_AMBULATORY_CARE_PROVIDER_SITE_OTHER): Payer: 59 | Admitting: Pulmonary Disease

## 2022-07-16 ENCOUNTER — Encounter: Payer: Self-pay | Admitting: Pulmonary Disease

## 2022-07-16 ENCOUNTER — Telehealth: Payer: Self-pay

## 2022-07-16 ENCOUNTER — Other Ambulatory Visit (HOSPITAL_COMMUNITY): Payer: Self-pay

## 2022-07-16 ENCOUNTER — Telehealth: Payer: Self-pay | Admitting: Pulmonary Disease

## 2022-07-16 VITALS — BP 132/84 | HR 84 | Ht 64.0 in | Wt 212.4 lb

## 2022-07-16 DIAGNOSIS — J432 Centrilobular emphysema: Secondary | ICD-10-CM | POA: Diagnosis not present

## 2022-07-16 MED ORDER — ROFLUMILAST 500 MCG PO TABS: 500.0000 ug | ORAL_TABLET | Freq: Every day | ORAL | 2 refills | Status: DC

## 2022-07-16 MED ORDER — TRELEGY ELLIPTA 100-62.5-25 MCG/ACT IN AEPB
1.0000 | INHALATION_SPRAY | Freq: Every day | RESPIRATORY_TRACT | 5 refills | Status: DC
Start: 2022-07-16 — End: 2022-07-16

## 2022-07-16 MED ORDER — TRELEGY ELLIPTA 200-62.5-25 MCG/ACT IN AEPB: 1.0000 | INHALATION_SPRAY | Freq: Every day | RESPIRATORY_TRACT | 5 refills | Status: DC

## 2022-07-16 MED ORDER — ROFLUMILAST 250 MCG PO TABS: 1.0000 | ORAL_TABLET | Freq: Every day | ORAL | 0 refills | Status: DC

## 2022-07-16 NOTE — Telephone Encounter (Signed)
Patient Advocate Encounter   Received notification that prior authorization for Roflumilast 250MCG tablets is required.   PA submitted on 07/16/2022 Key : [id number]  Status is pending

## 2022-07-16 NOTE — Progress Notes (Signed)
Subjective:   PATIENT ID: Caitlyn Lawrence GENDER: female DOB: 1946/05/14, MRN: [id number]   HPI  Chief Complaint  Patient presents with   Follow-up    Pt f/u over COPD, breathing has been well since last visit    Reason for Visit: Follow-up COPD  [name] is a 76 year old female active smoker with COPD, atrial fibrillation, DM2, s/p CABG in 2005 who presents for follow-up  Synopsis: She was referred by her PCP Dr. Johnney Ou and established with [facility] in 06/2021. She is currently on Combivent, Breo ellipta, albuterol and duonebs. She moved from [place], [place] to [place], [place] in 03/2021 and has worsening COPD. She was previously seen by Pulmonology in her hometown for at least 10 years.  At baseline, she has expiratory wheezing, shortness of breath at rest and exertion and chronic cough throughout the day. She is on Breo daily, combivent one puff twice a day, and albuterol nebulizer three times a day. Her activity is limited due to her symptoms. Her last exacerbation requiring steroids was spring 2021. Has not been hospitalized for her COPD. Symptoms are triggered by activity, pollen, heat. Cold air improves it.   She performs ADLs including showering on her own but she has to pace herself and can be difficult at times. She reports that she has gradually slowed down in activity in the last few years. Her son assists with laundry, cooking, housework. She moved in with her son three months ago in [place].  She is an active smoker and using 1/2-1ppd. She is trying to eliminate. Tried patch for one day and self discontinued.   09/09/21 Since our last visit in July 2022, she was seen in urgent care on 09/02/21 for COPD exacerbation with prednisone and doxycycline. She continues to smoke 1/2 ppd. She was started on Trelegy and feels it has improved her respiratory symptoms. She continues to have shortness of breath with exertion and wheezing. Cough has  improved. She is able to perform more activities in the house including making her bed, laundry and loading dishwasher. Weather worsens her symptoms. She does report a mouth sore appeared one week ago. She states she washes her mouth every time she uses her inhalers. She uses her combivent three times a day out of habit but notices she does fine if she misses a dose. She uses her duonebs maybe 3-4 times a week.  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 She is compliant with her Trelegy once a day and her combivent twice a day. Previously used her albuterol neb to four times a day but now once a day due to pharmacy shortage. She notices that she is wheezing more and allergies seem to exacerbate it. Able to perform household duties for herself but no scheduled exercise. She plans to start walking with family this summer. Still smoking 1/2 ppd. Smoking is triggered by habits including when eating breakfast or going to the bathroom.  07/16/22 Since our last visit she had COPD exacerbation again in July treated by her PCP. She is doing well. She has intermittent productive cough, wheezing and shortness of breath. She uses nebulizer four times daily. She uses rescue inhaler twice a day but only when she is out. She tries to avoid the heat when she can as this triggers it. She continues to smoke. She is minimizing to morning coffee and meals. Using nicorette.  Social History: Active smoker.  60 pack-years   Past Medical History:  Diagnosis Date   Angiectasia    of colon and duodenum   Blood transfusion without reported diagnosis    CAD (coronary artery disease)    Candidal esophagitis (HCC)    COPD with acute exacerbation ([place])    Diabetes ([place])    Diverticulosis    GI bleed    H. pylori infection 10/2017   Heart failure (HCC)    Hypertension    IDA (iron deficiency anemia)    Internal hemorrhoids    MI (myocardial infarction) ([place])     Osteoarthritis    Pancreatic steatorrhea    Paroxysmal atrial fibrillation (HCC)    TIA (transient ischemic attack) 2020   Tubular adenoma of colon     Allergies  Allergen Reactions   Sulfa Antibiotics Other (See Comments), Hives and Itching    Pt does not like how it makes her feel.  Other reaction(s): Other (See Comments) Pt does not like how it makes her feel.     Penicillins Hives and Itching     Outpatient Medications Prior to Visit  Medication Sig Dispense Refill   acetaminophen (TYLENOL) 500 MG tablet Take 1,000 mg by mouth every 6 (six) hours as needed for mild pain.     albuterol (ACCUNEB) 0.63 MG/3ML nebulizer solution Take 3 mLs (0.63 mg total) by nebulization every 6 (six) hours as needed for wheezing or shortness of breath. 75 mL 3   amLODipine (NORVASC) 5 MG tablet Take 1 tablet (5 mg total) by mouth daily. 90 tablet 0   azithromycin (ZITHROMAX) 250 MG tablet Please take 2 pills today and then after take 1 pill for 5 days. 7 tablet 0   Blood Glucose Monitoring Suppl (ONETOUCH VERIO) w/Device KIT 1 Units by Does not apply route 3 (three) times daily. 1 kit 0   camphor-menthol (SARNA) lotion Apply 1 Application topically as needed for itching. 222 mL 0   Clobetasol Propionate 0.05 % shampoo Please apply to scalp daily (Patient taking differently: Apply 1 application  topically every other day.) 118 mL 0   COMBIVENT RESPIMAT 20-100 MCG/ACT AERS respimat Inhale 1 puff into the lungs every 6 (six) hours as needed for wheezing. 4 g 5   diclofenac Sodium (VOLTAREN) 1 % GEL Apply 2 g topically 4 (four) times daily. 50 g 0   DULoxetine (CYMBALTA) 30 MG capsule Take 1 capsule (30 mg total) by mouth daily. 30 capsule 2   empagliflozin (JARDIANCE) 10 MG TABS tablet Take 1 tablet (10 mg total) by mouth daily before breakfast. 90 tablet 2   gabapentin (NEURONTIN) 300 MG capsule Take 1 capsule (300 mg total) by mouth 3 (three) times daily. 90 capsule 2   Insulin Syringe-Needle U-100 31G  X 15/64" 0.5 ML MISC Please use with insulin 100 each 3   ipratropium (ATROVENT) 0.02 % nebulizer solution Take 2.5 mLs (0.5 mg total) by nebulization in the morning and at bedtime. 150 mL 2   Lancets MISC 1 Units by Does not apply route in the morning, at noon, and at bedtime. 100 each 2   losartan (COZAAR) 100 MG tablet Take 1 tablet (100 mg total) by mouth daily. 90 tablet 2   lovastatin (MEVACOR) 20 MG tablet Take 1 tablet (20 mg total) by mouth every evening. 90 tablet 1   metoprolol succinate (TOPROL-XL) 100 MG 24 hr tablet TAKE 1 TABLET BY MOUTH ONCE DAILY **TAKE  WITH  OR  IMMEDIATELY  FOLLOWING  A  MEAL**  90 tablet 1   Multiple Vitamin (MULTIVITAMIN) capsule Take 1 capsule by mouth daily.     nicotine polacrilex (NICORETTE) 2 MG gum Take 1 each (2 mg total) by mouth as needed for smoking cessation. 100 tablet 0   OneTouch Delica Lancets 18H MISC USE   TO CHECK GLUCOSE 4 TIMES DAILY 100 each 3   ONETOUCH VERIO test strip USE 1 STRIP TO CHECK GLUCOSE THREE TIMES DAILY AS DIRECTED 100 each 3   polyethylene glycol (MIRALAX / GLYCOLAX) 17 g packet Take 17 g by mouth as needed for mild constipation. (Patient taking differently: Take 17 g by mouth daily as needed for mild constipation.) 30 each 3   rivaroxaban (XARELTO) 20 MG TABS tablet Take 1 tablet (20 mg total) by mouth daily. 90 tablet 3   sodium chloride (OCEAN) 0.65 % SOLN nasal spray Place 1 spray into both nostrils 3 (three) times daily as needed for congestion.     Vitamin D, Ergocalciferol, (DRISDOL) 1.25 MG (50000 UNIT) CAPS capsule Take 50,000 Units by mouth every 7 (seven) days. Mondays     Fluticasone-Umeclidin-Vilant (TRELEGY ELLIPTA) 100-62.5-25 MCG/ACT AEPB Inhale 1 puff into the lungs daily. 60 each 5   Insulin Degludec (TRESIBA [place]) Inject 50 Units into the skin at bedtime.     ferrous sulfate 325 (65 FE) MG tablet Take 1 tablet (325 mg total) by mouth every Monday, Wednesday, and Friday. 36 tablet 0   Insulin Pen Needle 32G X 4  MM MISC Inject 1 each into the skin every morning. Use to inject insulin 4 times a day 360 each 3   No facility-administered medications prior to visit.    Review of Systems  Constitutional:  Negative for chills, diaphoresis, fever, malaise/fatigue and weight loss.  HENT:  Negative for congestion.   Respiratory:  Positive for cough, shortness of breath and wheezing. Negative for hemoptysis and sputum production.   Cardiovascular:  Negative for chest pain, palpitations and leg swelling.     Objective:   Vitals:   07/16/22 1103  BP: 132/84  Pulse: 84  SpO2: 98%  Weight: 212 lb 6.4 oz (96.3 kg)  Height: _0  (1.626 m)    SpO2: 98 %  Body mass index is 36.46 kg/m.  Physical Exam: General: Well-appearing, no acute distress HENT: [place], AT Eyes: EOMI, no scleral icterus Respiratory: Diminished but clear to auscultation bilaterally.  No crackles, wheezing or rales Cardiovascular: RRR, -M/R/G, no JVD Extremities:-Edema,-tenderness Neuro: AAO x4, CNII-XII grossly intact Psych: Normal mood, normal affect  Data Reviewed:  Imaging: CT Chest 08/08/1999 (report only) - Mild paraseptal emphysema CT Chest Lung Screen 08/28/21 - Mild centrilobular and paraseptal emphysema with upper lobe predominance. Lingular atelectasis/scarring  PFT: None on file  Labs: CBC    Component Value Date/Time   WBC 8.3 07/17/2022 1602   RBC 5.44 (H) 07/17/2022 1602   HGB 15.4 (H) 07/17/2022 1602   HGB 10.6 (L) 12/16/2021 1116   HCT 46.4 (H) 07/17/2022 1602   HCT 36.8 12/16/2021 1116   PLT 310 07/17/2022 1602   PLT 310 12/16/2021 1116   MCV 85.3 07/17/2022 1602   MCV 79 12/16/2021 1116   MCH 28.3 07/17/2022 1602   MCHC 33.2 07/17/2022 1602   RDW 14.1 07/17/2022 1602   RDW 21.2 (H) 12/16/2021 1116   LYMPHSABS 0.8 12/31/2021 0934   MONOABS 0.5 12/31/2021 0934   EOSABS 0.3 12/31/2021 0934   BASOSABS 0.1 12/31/2021 0934   Absolute eosinophils 04/08/21 - 200  Assessment & Plan:    Discussion: 76 year old female active smoker with COPD, atrial fibrillation, DM2, s/p CABG in 2005 who presents for follow-up for COPD. Has had multiple outpatient exacerbations in Sept 2022, April 2023 and July 2023. No hospitalizations related to COPD. On triple therapy however remains symptomatic and requiring nebs during the day which improves it. Discussed smoking cessation.  Usually needs lower dose of prednisone due to hx hyperglycemia  COPD with emphysema - symptomatic --CONTINUE Trelegy 200 ONE puff ONCE a day. REFILL --CONTINUE Duonebs nebulizer treatment AS NEEDED up to 4 times a day for shortness of breath or wheezing.  --CONTINUE Combivent ONE puff AS NEEDED for shortness of breath or wheezing. REFILL  Tobacco abuse Patient is an active smoker. Improved She has limited smoking to 2-3 cigarettes a day   Health Maintenance Immunization History  Administered Date(s) Administered   Influenza, High Dose Seasonal PF 10/12/2012, 11/14/2013, 11/20/2014, 11/13/2015, 10/18/2016, 09/09/2017, 09/29/2018, 10/26/2019, 10/18/2020   Influenza-Unspecified 09/10/2021   Moderna Sars-Covid-2 Vaccination 01/19/2020, 03/05/2020, 11/09/2020   PNEUMOCOCCAL CONJUGATE-20 11/18/2021   Pneumococcal Conjugate-13 09/09/2017   Pneumococcal Polysaccharide-23 10/19/2019   Tdap 12/16/2021   CT Lung Screen - 08/2022  No orders of the defined types were placed in this encounter.  Meds ordered this encounter  Medications   Roflumilast (DALIRESP) 250 MCG TABS    Sig: Take 1 tablet by mouth daily.    Dispense:  28 tablet    Refill:  0   roflumilast (DALIRESP) 500 MCG TABS tablet    Sig: Take 1 tablet (500 mcg total) by mouth daily.    Dispense:  30 tablet    Refill:  2   DISCONTD: Fluticasone-Umeclidin-Vilant (TRELEGY ELLIPTA) 100-62.5-25 MCG/ACT AEPB    Sig: Inhale 1 puff into the lungs daily.    Dispense:  60 each    Refill:  5   Fluticasone-Umeclidin-Vilant (TRELEGY ELLIPTA) 200-62.5-25  MCG/ACT AEPB    Sig: Inhale 1 puff into the lungs daily.    Dispense:  60 each    Refill:  5    Return in about 3 months (around 10/16/2022).  I have spent a total time of 32-minutes on the day of the appointment including chart review, data review, collecting history, coordinating care and discussing medical diagnosis and plan with the patient/family. Past medical history, allergies, medications were reviewed. Pertinent imaging, labs and tests included in this note have been reviewed and interpreted independently by me.  Pala, MD [facility] 07/20/2022  Office Number [phone]

## 2022-07-16 NOTE — Patient Instructions (Signed)
COPD with emphysema - symptomatic --CONTINUE Trelegy 200 ONE puff ONCE a day. REFILL --CONTINUE Duonebs nebulizer treatment AS NEEDED up to 4 times a day for shortness of breath or wheezing.  --CONTINUE Combivent ONE puff AS NEEDED for shortness of breath or wheezing. REFILL --START Daliresp 250 mg daily x 4 weeks then increase to 500 mg daily  Tobacco abuse Patient is an active smoker. Improved She has limited smoking to 2-3 cigarettes a day  Follow-up with me in 3 months

## 2022-07-16 NOTE — Telephone Encounter (Signed)
Called pharmacy back and she states that a Prior Auth is needed for:  Praxair and confirmed Trelegy dose.   Please advise ladies on PA that is needed

## 2022-07-16 NOTE — Telephone Encounter (Signed)
Patient Advocate Encounter   Received notification that prior authorization for Roflumilast 500MCG tablets is required.   PA submitted on 07/16/2022 Key Key: [id number] Status is pending

## 2022-07-17 ENCOUNTER — Ambulatory Visit (INDEPENDENT_AMBULATORY_CARE_PROVIDER_SITE_OTHER): Payer: 59 | Admitting: Student

## 2022-07-17 ENCOUNTER — Other Ambulatory Visit: Payer: Self-pay

## 2022-07-17 ENCOUNTER — Other Ambulatory Visit: Payer: Self-pay | Admitting: Student

## 2022-07-17 ENCOUNTER — Telehealth: Payer: Self-pay | Admitting: Student

## 2022-07-17 ENCOUNTER — Telehealth: Payer: Self-pay | Admitting: Cardiology

## 2022-07-17 ENCOUNTER — Emergency Department (HOSPITAL_COMMUNITY)
Admission: EM | Admit: 2022-07-17 | Discharge: 2022-07-17 | Discharge status: Against Medical Advice | Payer: 59 | Attending: Emergency Medicine | Admitting: Emergency Medicine

## 2022-07-17 DIAGNOSIS — Z7901 Long term (current) use of anticoagulants: Secondary | ICD-10-CM | POA: Diagnosis not present

## 2022-07-17 DIAGNOSIS — Z5321 Procedure and treatment not carried out due to patient leaving prior to being seen by health care provider: Secondary | ICD-10-CM | POA: Diagnosis not present

## 2022-07-17 DIAGNOSIS — K068 Other specified disorders of gingiva and edentulous alveolar ridge: Secondary | ICD-10-CM | POA: Diagnosis present

## 2022-07-17 LAB — BASIC METABOLIC PANEL
Anion gap: 9 (ref 5–15)
BUN: 11 mg/dL (ref 8–23)
CO2: 26 mmol/L (ref 22–32)
Calcium: 9.4 mg/dL (ref 8.9–10.3)
Chloride: 107 mmol/L (ref 98–111)
Creatinine, Ser: 0.71 mg/dL (ref 0.44–1.00)
GFR, Estimated: >60 mL/min (ref >60)
Glucose, Bld: 102 mg/dL — ABNORMAL HIGH (ref 70–99)
Potassium: 3.9 mmol/L (ref 3.5–5.1)
Sodium: 142 mmol/L (ref 135–145)

## 2022-07-17 LAB — CBC
HCT: 46.4 % — ABNORMAL HIGH (ref 36.0–46.0)
Hemoglobin: 15.4 g/dL — ABNORMAL HIGH (ref 12.0–15.0)
MCH: 28.3 pg (ref 26.0–34.0)
MCHC: 33.2 g/dL (ref 30.0–36.0)
MCV: 85.3 fL (ref 80.0–100.0)
Platelets: 310 10*3/uL (ref 150–400)
RBC: 5.44 MIL/uL — ABNORMAL HIGH (ref 3.87–5.11)
RDW: 14.1 % (ref 11.5–15.5)
WBC: 8.3 10*3/uL (ref 4.0–10.5)
nRBC: 0 % (ref 0.0–0.2)

## 2022-07-17 NOTE — Telephone Encounter (Signed)
Patient has appt with PCP this afternoon to assess bleeding

## 2022-07-17 NOTE — Telephone Encounter (Signed)
Received on-call page that Ms. Dohmen is requesting to speak to provider. I called [name], who reports bleeding from her mouth for the last few days. She believes this is coming from her gums. Says she feels well otherwise, denies lightheadedness, dizziness, syncopal episodes, fevers, chills. Of note, she is on Xarelto twice daily for paroxysmal atrial fibrillation. I suspect her Xarelto is the cause of this bleed based on her history, but she will need to be seen in person to further evaluate. We do have spots open in our clinic this afternoon, I have reached out to our administrative staff to help coordinate an appointment today. PCP has been made aware as well.   Sanjuan Dame, MD Internal Medicine PGY-3 Pager: [phone]

## 2022-07-17 NOTE — ED Provider Triage Note (Signed)
Emergency Medicine Provider Triage Evaluation Note  Caitlyn Lawrence , a 76 y.o. female  was evaluated in triage.  Pt complains of bleeding from her gums since last night.  She is on Xarelto for A-fib.  Currently without active bleeding.  She was evaluated at the internal medicine clinic and referred to the emergency room.  Denies lightheadedness.  Denies trauma.  Review of Systems  Positive: As above Negative: As above  Physical Exam  There were no vitals taken for this visit. Gen:   Awake, no distress   Resp:  Normal effort  MSK:   Moves extremities without difficulty  Other:  Area of irritation noted on her gums.  No active bleeding noted.  Medical Decision Making  Medically screening exam initiated at 3:40 PM.  Appropriate orders placed.  [name] was informed that the remainder of the evaluation will be completed by another provider, this initial triage assessment does not replace that evaluation, and the importance of remaining in the ED until their evaluation is complete.     Evlyn Courier, PA-C 07/17/22 1541

## 2022-07-17 NOTE — ED Triage Notes (Signed)
Pt here from home for bleeding from gums. Pt reports hx of this and usually subsides w/ holding pressure. Pt states it started last night and has been intermittent since. Pt is on xarelto. Pt denies pain.

## 2022-07-17 NOTE — Telephone Encounter (Signed)
Just spoke with patient. She agreed to an appointment this afternoon 07/17/22 at 2:45 pm with Dr. Johnney Ou.

## 2022-07-17 NOTE — Assessment & Plan Note (Addendum)
Patient on chronic anticoagulation presents today for gum bleeding. She notes this started last night after she was sitting and watching TV. She has had all of her teeth pulled and has no dentures, she only eats soft foods. She has had no trauma to the area. Had this occur three months ago and went to the ED, eventually it stopped during that evaluation.  The bleeding has not stopped for almost 24 hours. On exam she is having active bleeding from her front gum. The bleeding has not stopped and as such will send to ED for possible cautery.

## 2022-07-17 NOTE — Telephone Encounter (Signed)
Pharmacy returned call about two different doses of the daliresp being sent to pharmacy. Stated that pt would first do the 226mg and once that Rx was finished, pt would begin taking the 5044m. Stated to her that PAs are in place and showing status of pending and she said that she was able to get the 25081mto run through at no cost so that PA was approved.
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
## 2022-07-17 NOTE — Telephone Encounter (Signed)
Pt c/o medication issue:  1. Name of Medication:   rivaroxaban (XARELTO) 20 MG TABS tablet    2. How are you currently taking this medication (dosage and times per day)? Take 1 tablet (20 mg total) by mouth daily  3. Are you having a reaction (difficulty breathing--STAT)? NO  4. What is your medication issue? Pt states that she thinks medication is causing her mouth to bleed. She would like a callback as soon as possible. Please advise

## 2022-07-17 NOTE — ED Notes (Signed)
Called x4 to sign consent form and no response

## 2022-07-17 NOTE — Telephone Encounter (Signed)
Spoke with pt she states that she is at her PCP and she will ask them about bleeding

## 2022-07-17 NOTE — Addendum Note (Signed)
Addended by: Riesa Pope on: 07/17/2022 03:08 PM   Modules accepted: Orders

## 2022-07-18 MED ORDER — TRESIBA FLEXTOUCH 100 UNIT/ML ~~LOC~~ SOPN
50.0000 [IU] | PEN_INJECTOR | Freq: Every day | SUBCUTANEOUS | 3 refills | Status: DC
Start: 2022-07-18 — End: 2022-08-29

## 2022-07-18 NOTE — Addendum Note (Signed)
Addended by: Riesa Pope on: 07/18/2022 05:16 PM   Modules accepted: Orders

## 2022-07-21 NOTE — Telephone Encounter (Signed)
Patient Advocate Encounter  Prior Authorization for Roflumilast 250MCG tablets has been approved.    PA Case ID: [id number]  Rx #: [id number] Effective through 12/28/2022  Clista Bernhardt, CPhT Pharmacy Patient [place] Patient Advocate Team Phone: [phone]   Fax: [phone]

## 2022-07-21 NOTE — Progress Notes (Signed)
Internal Medicine Clinic Attending  I saw and evaluated the patient.  I personally confirmed the key portions of the history and exam documented by Dr. Katsadouros and I reviewed pertinent patient test results.  The assessment, diagnosis, and plan were formulated together and I agree with the documentation in the resident's note.  

## 2022-07-25 NOTE — Progress Notes (Signed)
Carelink Summary Report / Loop Recorder 

## 2022-07-27 ENCOUNTER — Other Ambulatory Visit: Payer: Self-pay | Admitting: Student

## 2022-08-08 ENCOUNTER — Other Ambulatory Visit: Payer: Self-pay | Admitting: Pulmonary Disease

## 2022-08-11 ENCOUNTER — Ambulatory Visit: Payer: 59

## 2022-08-18 LAB — CUP PACEART REMOTE DEVICE CHECK: Date Time Interrogation Session: 20230819230551

## 2022-08-25 ENCOUNTER — Other Ambulatory Visit: Payer: Self-pay | Admitting: Acute Care

## 2022-08-25 DIAGNOSIS — F17210 Nicotine dependence, cigarettes, uncomplicated: Secondary | ICD-10-CM

## 2022-08-25 DIAGNOSIS — Z87891 Personal history of nicotine dependence: Secondary | ICD-10-CM

## 2022-08-28 ENCOUNTER — Other Ambulatory Visit: Payer: Self-pay | Admitting: Pulmonary Disease

## 2022-08-28 ENCOUNTER — Other Ambulatory Visit: Payer: Self-pay | Admitting: Student

## 2022-08-28 ENCOUNTER — Inpatient Hospital Stay: Admission: RE | Admit: 2022-08-28 | Payer: 59 | Source: Ambulatory Visit

## 2022-08-28 DIAGNOSIS — E119 Type 2 diabetes mellitus without complications: Secondary | ICD-10-CM

## 2022-08-28 DIAGNOSIS — I509 Heart failure, unspecified: Secondary | ICD-10-CM

## 2022-08-28 DIAGNOSIS — M5136 Other intervertebral disc degeneration, lumbar region: Secondary | ICD-10-CM

## 2022-08-28 DIAGNOSIS — J441 Chronic obstructive pulmonary disease with (acute) exacerbation: Secondary | ICD-10-CM

## 2022-08-28 DIAGNOSIS — I480 Paroxysmal atrial fibrillation: Secondary | ICD-10-CM

## 2022-08-28 DIAGNOSIS — Z794 Long term (current) use of insulin: Secondary | ICD-10-CM

## 2022-08-29 ENCOUNTER — Other Ambulatory Visit: Payer: Self-pay | Admitting: *Deleted

## 2022-08-29 ENCOUNTER — Telehealth: Payer: Self-pay | Admitting: Pulmonary Disease

## 2022-08-29 DIAGNOSIS — I159 Secondary hypertension, unspecified: Secondary | ICD-10-CM

## 2022-08-29 DIAGNOSIS — M5136 Other intervertebral disc degeneration, lumbar region: Secondary | ICD-10-CM

## 2022-08-29 MED ORDER — DULOXETINE HCL 30 MG PO CPEP: 30.0000 mg | ORAL_CAPSULE | Freq: Every day | ORAL | 0 refills | Status: DC

## 2022-08-29 MED ORDER — TRESIBA FLEXTOUCH 100 UNIT/ML ~~LOC~~ SOPN: 50.0000 [IU] | PEN_INJECTOR | Freq: Every day | SUBCUTANEOUS | 3 refills | Status: DC

## 2022-08-29 MED ORDER — AMLODIPINE BESYLATE 5 MG PO TABS: 5.0000 mg | ORAL_TABLET | Freq: Every day | ORAL | 0 refills | Status: DC

## 2022-08-29 MED ORDER — GABAPENTIN 300 MG PO CAPS: 300.0000 mg | ORAL_CAPSULE | Freq: Three times a day (TID) | ORAL | 0 refills | Status: DC

## 2022-08-29 NOTE — Telephone Encounter (Signed)
I called the number and I was not able to leave a message.    I do not see Duo Nebulizer on her list. Nothing further needed.

## 2022-08-29 NOTE — Telephone Encounter (Signed)
Pt states she's changing pharmacy to [facility] for home delivery. Thanks

## 2022-08-29 NOTE — Telephone Encounter (Signed)
Daliresp was ordered for 250 mg daily and then titrated up to 500 mg daily starting 08/13/22.  These scripts are accurate in our EMR.

## 2022-08-29 NOTE — Telephone Encounter (Signed)
[name], please advise if pt is to still be on this dose of daliresp or if she should be on next dose.

## 2022-09-03 ENCOUNTER — Other Ambulatory Visit: Payer: Self-pay | Admitting: Student

## 2022-09-04 ENCOUNTER — Other Ambulatory Visit: Payer: Self-pay | Admitting: Student

## 2022-09-04 DIAGNOSIS — E119 Type 2 diabetes mellitus without complications: Secondary | ICD-10-CM

## 2022-09-08 ENCOUNTER — Other Ambulatory Visit: Payer: Self-pay | Admitting: Student

## 2022-09-08 DIAGNOSIS — M5136 Other intervertebral disc degeneration, lumbar region: Secondary | ICD-10-CM

## 2022-09-09 NOTE — Telephone Encounter (Signed)
Cymbalta and Gabapentin were refilled on 08/29/2022.

## 2022-09-18 ENCOUNTER — Ambulatory Visit
Admission: RE | Admit: 2022-09-18 | Discharge: 2022-09-18 | Disposition: A | Discharge status: Home / Self Care | Payer: 59 | Source: Ambulatory Visit | Attending: Acute Care | Admitting: Acute Care

## 2022-09-18 DIAGNOSIS — F17210 Nicotine dependence, cigarettes, uncomplicated: Secondary | ICD-10-CM

## 2022-09-18 DIAGNOSIS — Z87891 Personal history of nicotine dependence: Secondary | ICD-10-CM

## 2022-09-19 ENCOUNTER — Ambulatory Visit (INDEPENDENT_AMBULATORY_CARE_PROVIDER_SITE_OTHER): Payer: 59

## 2022-09-19 ENCOUNTER — Other Ambulatory Visit: Payer: Self-pay

## 2022-09-19 DIAGNOSIS — Z87891 Personal history of nicotine dependence: Secondary | ICD-10-CM

## 2022-09-19 DIAGNOSIS — Z122 Encounter for screening for malignant neoplasm of respiratory organs: Secondary | ICD-10-CM

## 2022-09-19 DIAGNOSIS — R55 Syncope and collapse: Secondary | ICD-10-CM

## 2022-09-19 DIAGNOSIS — F17210 Nicotine dependence, cigarettes, uncomplicated: Secondary | ICD-10-CM

## 2022-09-19 LAB — CUP PACEART REMOTE DEVICE CHECK: Date Time Interrogation Session: 20230921230953

## 2022-09-23 ENCOUNTER — Other Ambulatory Visit: Payer: Self-pay | Admitting: Student

## 2022-09-26 NOTE — Progress Notes (Signed)
Carelink Summary Report / Loop Recorder 

## 2022-10-01 ENCOUNTER — Other Ambulatory Visit: Payer: Self-pay | Admitting: Pulmonary Disease

## 2022-10-01 ENCOUNTER — Other Ambulatory Visit: Payer: Self-pay | Admitting: Student

## 2022-10-01 DIAGNOSIS — I159 Secondary hypertension, unspecified: Secondary | ICD-10-CM

## 2022-10-01 DIAGNOSIS — I25810 Atherosclerosis of coronary artery bypass graft(s) without angina pectoris: Secondary | ICD-10-CM

## 2022-10-13 ENCOUNTER — Ambulatory Visit: Payer: 59 | Admitting: Pulmonary Disease

## 2022-10-16 ENCOUNTER — Other Ambulatory Visit: Payer: Self-pay

## 2022-10-16 DIAGNOSIS — I159 Secondary hypertension, unspecified: Secondary | ICD-10-CM

## 2022-10-16 DIAGNOSIS — I25810 Atherosclerosis of coronary artery bypass graft(s) without angina pectoris: Secondary | ICD-10-CM

## 2022-10-16 MED ORDER — AMLODIPINE BESYLATE 5 MG PO TABS: 5.0000 mg | ORAL_TABLET | Freq: Every day | ORAL | 1 refills | Status: DC

## 2022-10-16 MED ORDER — LOVASTATIN 20 MG PO TABS: 20.0000 mg | ORAL_TABLET | Freq: Every evening | ORAL | 2 refills | Status: AC

## 2022-10-22 ENCOUNTER — Ambulatory Visit (INDEPENDENT_AMBULATORY_CARE_PROVIDER_SITE_OTHER): Payer: 59

## 2022-10-22 DIAGNOSIS — R55 Syncope and collapse: Secondary | ICD-10-CM | POA: Diagnosis not present

## 2022-10-22 LAB — CUP PACEART REMOTE DEVICE CHECK: Date Time Interrogation Session: 20231024230730

## 2022-10-26 NOTE — Progress Notes (Deleted)
Cardiology Office Note:    Date:  10/26/2022   ID:  Caitlyn Lawrence, DOB 11/28/1946, MRN [id number]  PCP:  Riesa Pope, MD  Cardiologist:  Donato Heinz, MD  Electrophysiologist:  None   Referring MD: Donato Heinz*   No chief complaint on file.   History of Present Illness:    Caitlyn Lawrence is a 76 y.o. female with a hx of CAD status post CABG in 2005, COPD, T2DM, hypertension, paroxysmal atrial fibrillation who presents for follow-up.  She was referred by Dr. Dareen Piano for evaluation of CAD, initially seen 08/22/2021.  Previously followed with Dr. Moshe Cipro in cardiology in [facility].  Moved to [place] in April 2022. Echo on 01/21/2021 showed EF 60 to 65%, normal RV function, no significant valvular disease, RVSP 40-45.  Nuclear stress test 2019 showed small basal lateral area of reversibility, EF 69%.  Has loop recorder that was placed for syncopal episodes after unremarkable event monitor.  Last interrogation in May showed nocturnal pauses, suspected sleep apnea.  Her CABG was in 2005 in [place].  She has not had to have subsequent intervention.  She denies any chest pain or dyspnea.  Does report intermittent palpitations.  Had 5 syncopal episodes last year.  Episodes involved sudden syncope, no prodromal symptoms.  Last episode occurred in March, reports she was walking in her trailer next and she remembers was waking up on the ground.  Loop recorder was placed in [place].  Device interrogation in May showed 1 pause in early a.m., suspected OSA.  Reports some lower extremity edema.  She has been taking Xarelto.  Has had issues with blood in stool in past, sees GI.  Denies any recent bleeding.  Echocardiogram 10/08/2021 showed normal biventricular function, normal diastolic function, no significant valvular disease.  Since last clinic visit,  Past Medical History:  Diagnosis Date   Angiectasia    of colon and duodenum   Blood  transfusion without reported diagnosis    CAD (coronary artery disease)    Candidal esophagitis (HCC)    COPD with acute exacerbation ([place])    Diabetes ([place])    Diverticulosis    GI bleed    H. pylori infection 10/2017   Heart failure (HCC)    Hypertension    IDA (iron deficiency anemia)    Internal hemorrhoids    MI (myocardial infarction) ([place])    Osteoarthritis    Pancreatic steatorrhea    Paroxysmal atrial fibrillation (HCC)    TIA (transient ischemic attack) 2020   Tubular adenoma of colon     Past Surgical History:  Procedure Laterality Date   BALLOON ENTEROSCOPY N/A 02/20/2022   Procedure: BALLOON ENTEROSCOPY;  Surgeon: Lavena Bullion, DO;  Location: [facility] ENDOSCOPY;  Service: Gastroenterology;  Laterality: N/A;   BIOPSY  02/20/2022   Procedure: BIOPSY;  Surgeon: Lavena Bullion, DO;  Location: [facility] ENDOS[facility];  Service: Gastroenterology;;   CARPAL TUNNEL RELEASE     CATARACT EXTRACTION Bilateral    COLONOSCOPY WITH PROPOFOL N/A 04/10/2021   Procedure: COLONOSCOPY WITH PROPOFOL;  Surgeon: Jerene Bears, MD;  Location: [facility] ENDOSCOPY;  Service: Gastroenterology;  Laterality: N/A;   CORONARY ARTERY BYPASS GRAFT N/A 2005   ENTEROSCOPY N/A 02/20/2022   Procedure: ENTEROSCOPY;  Surgeon: Lavena Bullion, DO;  Location: [facility] ENDOSCOPY;  Service: Gastroenterology;  Laterality: N/A;   ESOPHAGOGASTRODUODENOSCOPY (EGD) WITH PROPOFOL N/A 04/10/2021   Procedure: ESOPHAGOGASTRODUODENOSCOPY (EGD) WITH PROPOFOL;  Surgeon: Jerene Bears, MD;  Location: [facility];  Service:  Gastroenterology;  Laterality: N/A;   HEMOSTASIS CLIP PLACEMENT  02/20/2022   Procedure: HEMOSTASIS CLIP PLACEMENT;  Surgeon: Lavena Bullion, DO;  Location: [facility] ENDOS[facility];  Service: Gastroenterology;;   HEMOSTASIS CONTROL  04/10/2021   Procedure: HEMOSTASIS CONTROL;  Surgeon: Jerene Bears, MD;  Location: [facility];  Service: Gastroenterology;;  EPI injection   HOT HEMOSTASIS N/A 04/10/2021   Procedure: HOT  HEMOSTASIS (ARGON PLASMA COAGULATION/BICAP);  Surgeon: Jerene Bears, MD;  Location[facility] ENDOSCOPY;  Service: Gastroenterology;  Laterality: N/A;  EGD and COLON   HOT HEMOSTASIS N/A 02/20/2022   Procedure: HOT HEMOSTASIS (ARGON PLASMA COAGULATION/BICAP);  Surgeon: Lavena Bullion, DO;  Location: [facility] ENDOSCOPY;  Service: Gastroenterology;  Laterality: N/A;   KNEE ARTHROPLASTY Bilateral    POLYPECTOMY  04/10/2021   Procedure: POLYPECTOMY;  Surgeon: Jerene Bears, MD;  Location: [facility] ENDOSCOPY;  Service: Gastroenterology;;   SUBMUCOSAL TATTOO INJECTION  02/20/2022   Procedure: SUBMUCOSAL TATTOO INJECTION;  Surgeon: Lavena Bullion, DO;  Location: [facility] ENDOSCOPY;  Service: Gastroenterology;;   TOTAL SHOULDER ARTHROPLASTY Right 12/2009   TUBAL LIGATION      Current Medications: No outpatient medications have been marked as taking for the 10/27/22 encounter (Appointment) with Donato Heinz, MD.     Allergies:   Sulfa antibiotics and Penicillins   Social History   Socioeconomic History   Marital status: Single    Spouse name: Not on file   Number of children: 5   Years of education: Not on file   Highest education level: Not on file  Occupational History   Not on file  Tobacco Use   Smoking status: Every Day    Packs/day: 1.00    Years: 60.00    Total pack years: 60.00    Types: Cigarettes   Smokeless tobacco: Former   Tobacco comments:    Currently smoking 1-2 cigarettes a day  Vaping Use   Vaping Use: Never used  Substance and Sexual Activity   Alcohol use: Not Currently   Drug use: Never   Sexual activity: Not Currently  Other Topics Concern   Not on file  Social History Narrative   Not on file   Social Determinants of Health   Financial Resource Strain: Not on file  Food Insecurity: Not on file  Transportation Needs: Not on file  Physical Activity: Not on file  Stress: Not on file  Social Connections: Not on file     Family History: The patient's family  history includes Alzheimer's disease in her brother; Aneurysm in her mother; Cancer in her father; Diabetes in her sister; Hypertension in her sister; Lung cancer in her father and paternal aunt; Ovarian cancer in her daughter; Uterine cancer in her maternal aunt and mother. There is no history of Colon cancer or Esophageal cancer.  ROS:   Please see the history of present illness.     All other systems reviewed and are negative.  EKGs/Labs/Other Studies Reviewed:    The following studies were reviewed today:   EKG:  EKG is ordered today.  The ekg ordered today demonstrates normal sinus rhythm, rate 69, right bundle branch block, no ST abnormalities  Recent Labs: 12/31/2021: ALT 14 07/17/2022: BUN 11; Creatinine, Ser 0.71; Hemoglobin 15.4; Platelets 310; Potassium 3.9; Sodium 142  Recent Lipid Panel    Component Value Date/Time   CHOL 175 08/22/2021 1122   TRIG 89 08/22/2021 1122   HDL 87 08/22/2021 1122   CHOLHDL 2.0 08/22/2021 1122   LDLCALC 72 08/22/2021 1122  Physical Exam:    VS:  There were no vitals taken for this visit.    Wt Readings from Last 3 Encounters:  07/17/22 215 lb 12.8 oz (97.9 kg)  07/16/22 212 lb 6.4 oz (96.3 kg)  07/07/22 218 lb 4.8 oz (99 kg)     GEN:  Well nourished, well developed in no acute distress HEENT: Normal NECK: No JVD; No carotid bruits LYMPHATICS: No lymphadenopathy CARDIAC: RRR, no murmurs, rubs, gallops RESPIRATORY:  Clear to auscultation without rales, wheezing or rhonchi  ABDOMEN: Soft, non-tender, non-distended MUSCULOSKELETAL:  No edema; No deformity  SKIN: Warm and dry NEUROLOGIC:  Alert and oriented x 3 PSYCHIATRIC:  Normal affect   ASSESSMENT:    No diagnosis found.  PLAN:     CAD: s/p CABG in 2005.  Denies any anginal symptoms.  Echocardiogram 10/08/2021 showed normal biventricular function, normal diastolic function, no significant valvular disease. -Continue Xarelto -Continue lovastatin 20 mg.  LDL 72 on  08/22/2021 -Continue Toprol-XL 100 mg daily -Continue Imdur 60 mg daily  Paroxysmal atrial fibrillation: CHA2DS2-VASc 6 (hypertension, age x2, diabetes, CAD, female) -Continue Xarelto.  Has history of GI bleeding, follows with GI -Continue Toprol-XL 100 mg daily  Syncope: Reported multiple episodes of sudden loss of consciousness without prodromal symptoms, concerning for arrhythmia.  Last episode in March 2022. Loop recorder was placed in [place].  Established with Dr. Sallyanne Kuster for monitoring device.  Hypertension: On amlodipine 5 mg daily, losartan 100 mg daily.  Appears controlled  T2DM: On insulin, Jardiance.  A1c 6.2% on 07/15/2021.  Hyperlipidemia: On lovastatin 20 mg daily.  LDL 72 on 08/22/2021  Daytime somnolence: Given daytime somnolence and nocturnal pauses seen on prior loop recorder interrogation, recommend sleep study.  Ep[place] 10  RTC in 6 months  Medication Adjustments/Labs and Tests Ordered: Current medicines are reviewed at length with the patient today.  Concerns regarding medicines are outlined above.  No orders of the defined types were placed in this encounter.   No orders of the defined types were placed in this encounter.    There are no Patient Instructions on file for this visit.   Signed, Donato Heinz, MD  10/26/2022 8:55 PM    [place]

## 2022-10-27 ENCOUNTER — Ambulatory Visit: Payer: 59 | Attending: Cardiology | Admitting: Cardiology

## 2022-11-04 NOTE — Progress Notes (Signed)
Carelink Summary Report / Loop Recorder 

## 2022-11-30 ENCOUNTER — Other Ambulatory Visit: Payer: Self-pay | Admitting: Pulmonary Disease

## 2022-12-25 ENCOUNTER — Other Ambulatory Visit: Payer: Self-pay | Admitting: Student

## 2022-12-25 DIAGNOSIS — E119 Type 2 diabetes mellitus without complications: Secondary | ICD-10-CM

## 2022-12-30 ENCOUNTER — Other Ambulatory Visit: Payer: Self-pay | Admitting: Student

## 2022-12-30 ENCOUNTER — Ambulatory Visit (INDEPENDENT_AMBULATORY_CARE_PROVIDER_SITE_OTHER): Payer: 59

## 2022-12-30 DIAGNOSIS — I159 Secondary hypertension, unspecified: Secondary | ICD-10-CM

## 2022-12-30 DIAGNOSIS — R55 Syncope and collapse: Secondary | ICD-10-CM

## 2022-12-30 LAB — CUP PACEART REMOTE DEVICE CHECK: Date Time Interrogation Session: 20240101231953

## 2023-01-21 ENCOUNTER — Encounter: Payer: 59 | Admitting: Student

## 2023-01-26 ENCOUNTER — Other Ambulatory Visit: Payer: Self-pay | Admitting: Student

## 2023-01-26 DIAGNOSIS — E119 Type 2 diabetes mellitus without complications: Secondary | ICD-10-CM

## 2023-01-27 NOTE — Progress Notes (Signed)
[facility] Summary Report / Loop Recorder

## 2023-02-01 LAB — CUP PACEART REMOTE DEVICE CHECK: Date Time Interrogation Session: 20240203231022

## 2023-02-02 ENCOUNTER — Ambulatory Visit: Payer: 59

## 2023-02-02 DIAGNOSIS — R55 Syncope and collapse: Secondary | ICD-10-CM | POA: Diagnosis not present

## 2023-02-25 ENCOUNTER — Other Ambulatory Visit: Payer: Self-pay | Admitting: Student

## 2023-02-25 DIAGNOSIS — E119 Type 2 diabetes mellitus without complications: Secondary | ICD-10-CM

## 2023-02-26 NOTE — Telephone Encounter (Signed)
Error

## 2023-02-27 ENCOUNTER — Telehealth (HOSPITAL_BASED_OUTPATIENT_CLINIC_OR_DEPARTMENT_OTHER): Payer: Self-pay | Admitting: Pulmonary Disease

## 2023-02-27 MED ORDER — ROFLUMILAST 250 MCG PO TABS: 1.0000 | ORAL_TABLET | Freq: Every day | ORAL | 5 refills | Status: AC

## 2023-02-27 NOTE — Telephone Encounter (Signed)
Insurance contacted pulmonary office that brand name daliresp is no longer being covered. Will change to generic.

## 2023-03-09 ENCOUNTER — Ambulatory Visit: Payer: 59

## 2023-03-09 DIAGNOSIS — R55 Syncope and collapse: Secondary | ICD-10-CM

## 2023-03-10 LAB — CUP PACEART REMOTE DEVICE CHECK: Date Time Interrogation Session: 20240310231703

## 2023-03-17 NOTE — Progress Notes (Signed)
Carelink Summary Report / Loop Recorder 

## 2023-03-30 ENCOUNTER — Other Ambulatory Visit: Payer: Self-pay | Admitting: Student

## 2023-03-30 DIAGNOSIS — I159 Secondary hypertension, unspecified: Secondary | ICD-10-CM

## 2023-03-30 DIAGNOSIS — E119 Type 2 diabetes mellitus without complications: Secondary | ICD-10-CM

## 2023-03-30 DIAGNOSIS — I509 Heart failure, unspecified: Secondary | ICD-10-CM

## 2023-04-01 ENCOUNTER — Other Ambulatory Visit: Payer: Self-pay | Admitting: Student

## 2023-04-01 DIAGNOSIS — Z794 Long term (current) use of insulin: Secondary | ICD-10-CM

## 2023-04-13 ENCOUNTER — Ambulatory Visit (INDEPENDENT_AMBULATORY_CARE_PROVIDER_SITE_OTHER): Payer: 59

## 2023-04-13 DIAGNOSIS — R55 Syncope and collapse: Secondary | ICD-10-CM

## 2023-04-13 LAB — CUP PACEART REMOTE DEVICE CHECK: Date Time Interrogation Session: 20240414231009

## 2023-04-15 NOTE — Progress Notes (Signed)
Carelink Summary Report / Loop Recorder 

## 2023-05-02 ENCOUNTER — Other Ambulatory Visit: Payer: Self-pay | Admitting: Student

## 2023-05-02 DIAGNOSIS — I480 Paroxysmal atrial fibrillation: Secondary | ICD-10-CM

## 2023-05-04 ENCOUNTER — Other Ambulatory Visit: Payer: Self-pay | Admitting: Student

## 2023-05-04 DIAGNOSIS — E119 Type 2 diabetes mellitus without complications: Secondary | ICD-10-CM

## 2023-05-05 ENCOUNTER — Encounter: Payer: Self-pay | Admitting: Student

## 2023-05-05 NOTE — Telephone Encounter (Addendum)
Attempted to contact patient at telephone number on file, but no answer.  Also no voicemail was available to leave a message.  Will mail a letter to current address on file asking patient to contact us to schedule a future appointment.

## 2023-05-15 NOTE — Progress Notes (Signed)
Carelink Summary Report / Loop Recorder 

## 2023-05-18 ENCOUNTER — Ambulatory Visit (INDEPENDENT_AMBULATORY_CARE_PROVIDER_SITE_OTHER): Payer: 59

## 2023-05-18 DIAGNOSIS — R55 Syncope and collapse: Secondary | ICD-10-CM | POA: Diagnosis not present

## 2023-05-18 LAB — CUP PACEART REMOTE DEVICE CHECK: Date Time Interrogation Session: 20240517230624

## 2023-06-04 ENCOUNTER — Other Ambulatory Visit: Payer: Self-pay | Admitting: Student

## 2023-06-04 DIAGNOSIS — E119 Type 2 diabetes mellitus without complications: Secondary | ICD-10-CM

## 2023-06-12 NOTE — Progress Notes (Signed)
Carelink Summary Report / Loop Recorder 

## 2023-06-22 ENCOUNTER — Ambulatory Visit (INDEPENDENT_AMBULATORY_CARE_PROVIDER_SITE_OTHER): Payer: 59

## 2023-06-22 DIAGNOSIS — R55 Syncope and collapse: Secondary | ICD-10-CM

## 2023-06-22 LAB — CUP PACEART REMOTE DEVICE CHECK: Date Time Interrogation Session: 20240623230805

## 2023-06-28 ENCOUNTER — Other Ambulatory Visit: Payer: Self-pay | Admitting: Acute Care

## 2023-06-28 ENCOUNTER — Other Ambulatory Visit: Payer: Self-pay | Admitting: Student

## 2023-06-28 DIAGNOSIS — F17210 Nicotine dependence, cigarettes, uncomplicated: Secondary | ICD-10-CM

## 2023-06-28 DIAGNOSIS — I25810 Atherosclerosis of coronary artery bypass graft(s) without angina pectoris: Secondary | ICD-10-CM

## 2023-06-28 DIAGNOSIS — Z87891 Personal history of nicotine dependence: Secondary | ICD-10-CM

## 2023-06-28 DIAGNOSIS — Z122 Encounter for screening for malignant neoplasm of respiratory organs: Secondary | ICD-10-CM

## 2023-07-09 NOTE — Progress Notes (Signed)
Carelink Summary Report / Loop Recorder 

## 2023-07-27 ENCOUNTER — Ambulatory Visit: Payer: 59

## 2023-07-27 DIAGNOSIS — R55 Syncope and collapse: Secondary | ICD-10-CM | POA: Diagnosis not present

## 2023-08-05 ENCOUNTER — Other Ambulatory Visit (HOSPITAL_BASED_OUTPATIENT_CLINIC_OR_DEPARTMENT_OTHER): Payer: Self-pay | Admitting: Pulmonary Disease

## 2023-08-11 ENCOUNTER — Other Ambulatory Visit: Payer: Self-pay

## 2023-08-11 DIAGNOSIS — Z794 Long term (current) use of insulin: Secondary | ICD-10-CM

## 2023-08-11 NOTE — Progress Notes (Signed)
Carelink Summary Report / Loop Recorder 

## 2023-08-11 NOTE — Telephone Encounter (Signed)
Called patient to make an appointment with [place], she stated she is no longer out patient and has moved back home.

## 2023-08-27 LAB — CUP PACEART REMOTE DEVICE CHECK
Date Time Interrogation Session: 20240828230827
Implantable Pulse Generator Implant Date: 20210917

## 2023-09-01 ENCOUNTER — Ambulatory Visit (INDEPENDENT_AMBULATORY_CARE_PROVIDER_SITE_OTHER): Payer: 59

## 2023-09-01 DIAGNOSIS — R55 Syncope and collapse: Secondary | ICD-10-CM | POA: Diagnosis not present

## 2023-09-03 ENCOUNTER — Other Ambulatory Visit (HOSPITAL_BASED_OUTPATIENT_CLINIC_OR_DEPARTMENT_OTHER): Payer: Self-pay | Admitting: Pulmonary Disease

## 2023-09-04 ENCOUNTER — Other Ambulatory Visit (HOSPITAL_BASED_OUTPATIENT_CLINIC_OR_DEPARTMENT_OTHER): Payer: Self-pay

## 2023-09-09 NOTE — Progress Notes (Signed)
Carelink Summary Report / Loop Recorder 

## 2023-09-22 ENCOUNTER — Inpatient Hospital Stay: Admission: RE | Admit: 2023-09-22 | Payer: 59 | Source: Ambulatory Visit

## 2023-09-29 LAB — CUP PACEART REMOTE DEVICE CHECK
Date Time Interrogation Session: 20240930231145
Implantable Pulse Generator Implant Date: 20210917

## 2023-10-05 ENCOUNTER — Ambulatory Visit (INDEPENDENT_AMBULATORY_CARE_PROVIDER_SITE_OTHER): Payer: 59

## 2023-10-05 ENCOUNTER — Other Ambulatory Visit: Payer: Self-pay

## 2023-10-05 DIAGNOSIS — R55 Syncope and collapse: Secondary | ICD-10-CM | POA: Diagnosis not present

## 2023-10-05 DIAGNOSIS — E119 Type 2 diabetes mellitus without complications: Secondary | ICD-10-CM

## 2023-10-05 NOTE — Telephone Encounter (Signed)
Error

## 2023-10-16 NOTE — Progress Notes (Signed)
Carelink Summary Report / Loop Recorder 

## 2023-11-03 LAB — CUP PACEART REMOTE DEVICE CHECK
Date Time Interrogation Session: 20241102230023
Implantable Pulse Generator Implant Date: 20210917

## 2023-11-09 ENCOUNTER — Ambulatory Visit (INDEPENDENT_AMBULATORY_CARE_PROVIDER_SITE_OTHER): Payer: 59

## 2023-11-09 DIAGNOSIS — R55 Syncope and collapse: Secondary | ICD-10-CM

## 2023-12-02 NOTE — Progress Notes (Signed)
Carelink Summary Report / Loop Recorder 

## 2023-12-04 ENCOUNTER — Ambulatory Visit (INDEPENDENT_AMBULATORY_CARE_PROVIDER_SITE_OTHER): Payer: 59

## 2023-12-04 DIAGNOSIS — R55 Syncope and collapse: Secondary | ICD-10-CM | POA: Diagnosis not present

## 2023-12-04 LAB — CUP PACEART REMOTE DEVICE CHECK
Date Time Interrogation Session: 20241205230445
Implantable Pulse Generator Implant Date: 20210917

## 2023-12-14 ENCOUNTER — Ambulatory Visit: Payer: 59

## 2024-01-08 ENCOUNTER — Ambulatory Visit: Payer: 59

## 2024-01-08 DIAGNOSIS — R55 Syncope and collapse: Secondary | ICD-10-CM | POA: Diagnosis not present

## 2024-01-08 LAB — CUP PACEART REMOTE DEVICE CHECK
Date Time Interrogation Session: 20250109230147
Implantable Pulse Generator Implant Date: 20210917

## 2024-02-12 ENCOUNTER — Ambulatory Visit: Payer: 59

## 2024-02-12 NOTE — Progress Notes (Signed)
[facility] Summary Report / Loop Recorder

## 2024-03-18 ENCOUNTER — Ambulatory Visit: Payer: 59

## 2024-04-22 ENCOUNTER — Ambulatory Visit: Payer: 59

## 2024-05-27 ENCOUNTER — Ambulatory Visit: Payer: 59

## 2024-07-04 ENCOUNTER — Ambulatory Visit: Payer: 59

## 2024-08-05 ENCOUNTER — Ambulatory Visit: Payer: 59

## 2024-09-09 ENCOUNTER — Ambulatory Visit: Payer: 59

## 2024-10-14 ENCOUNTER — Ambulatory Visit: Payer: 59

## 2024-11-18 ENCOUNTER — Ambulatory Visit: Payer: 59

## 2024-12-23 ENCOUNTER — Ambulatory Visit: Payer: 59
